# Patient Record
Sex: Male | Born: 1990 | Race: Black or African American | Hispanic: No | Marital: Single | Smoking: Never smoker
Health system: Southern US, Community
[De-identification: ages and names within clinical notes are randomized; demographics above are authoritative.]

## PROBLEM LIST (undated history)

## (undated) DIAGNOSIS — L0390 Cellulitis, unspecified: Secondary | ICD-10-CM

## (undated) DIAGNOSIS — G40909 Epilepsy, unspecified, not intractable, without status epilepticus: Secondary | ICD-10-CM

## (undated) DIAGNOSIS — D693 Immune thrombocytopenic purpura: Secondary | ICD-10-CM

## (undated) HISTORY — PX: TONSILLECTOMY: SUR1361

---
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
## 2013-03-06 ENCOUNTER — Emergency Department: Payer: Self-pay | Admitting: Emergency Medicine

## 2013-03-06 LAB — COMPREHENSIVE METABOLIC PANEL
ANION GAP: 1 — AB (ref 7–16)
Albumin: 3.8 g/dL (ref 3.4–5.0)
Alkaline Phosphatase: 103 U/L
BILIRUBIN TOTAL: 0.2 mg/dL (ref 0.2–1.0)
BUN: 16 mg/dL (ref 7–18)
Calcium, Total: 9.2 mg/dL (ref 8.5–10.1)
Chloride: 105 mmol/L (ref 98–107)
Co2: 31 mmol/L (ref 21–32)
Creatinine: 1.01 mg/dL (ref 0.60–1.30)
GLUCOSE: 109 mg/dL — AB (ref 65–99)
OSMOLALITY: 276 (ref 275–301)
Potassium: 3.7 mmol/L (ref 3.5–5.1)
SGOT(AST): 25 U/L (ref 15–37)
SGPT (ALT): 31 U/L (ref 12–78)
SODIUM: 137 mmol/L (ref 136–145)
Total Protein: 7.7 g/dL (ref 6.4–8.2)

## 2013-03-06 LAB — CBC
HCT: 41.4 % (ref 40.0–52.0)
HGB: 13.3 g/dL (ref 13.0–18.0)
MCH: 25.7 pg — ABNORMAL LOW (ref 26.0–34.0)
MCHC: 32.2 g/dL (ref 32.0–36.0)
MCV: 80 fL (ref 80–100)
PLATELETS: 234 10*3/uL (ref 150–440)
RBC: 5.18 10*6/uL (ref 4.40–5.90)
RDW: 14.8 % — ABNORMAL HIGH (ref 11.5–14.5)
WBC: 8.2 10*3/uL (ref 3.8–10.6)

## 2013-03-07 LAB — URINALYSIS, COMPLETE
Bacteria: NONE SEEN
Bilirubin,UR: NEGATIVE
Blood: NEGATIVE
GLUCOSE, UR: NEGATIVE mg/dL (ref 0–75)
Ketone: NEGATIVE
Leukocyte Esterase: NEGATIVE
Nitrite: NEGATIVE
PH: 6 (ref 4.5–8.0)
Protein: NEGATIVE
Specific Gravity: 1.027 (ref 1.003–1.030)
Squamous Epithelial: <1
WBC UR: 3 /HPF (ref 0–5)

## 2013-05-01 ENCOUNTER — Emergency Department: Payer: Self-pay | Admitting: Emergency Medicine

## 2013-11-14 ENCOUNTER — Emergency Department: Payer: Self-pay | Admitting: Emergency Medicine

## 2013-12-19 ENCOUNTER — Emergency Department: Payer: Self-pay | Admitting: Emergency Medicine

## 2013-12-19 LAB — COMPREHENSIVE METABOLIC PANEL
ALBUMIN: 3.8 g/dL (ref 3.4–5.0)
Alkaline Phosphatase: 106 U/L
Anion Gap: 6 — ABNORMAL LOW (ref 7–16)
BUN: 14 mg/dL (ref 7–18)
Bilirubin,Total: 0.4 mg/dL (ref 0.2–1.0)
CHLORIDE: 105 mmol/L (ref 98–107)
Calcium, Total: 8.9 mg/dL (ref 8.5–10.1)
Co2: 27 mmol/L (ref 21–32)
Creatinine: 0.9 mg/dL (ref 0.60–1.30)
GLUCOSE: 130 mg/dL — AB (ref 65–99)
OSMOLALITY: 278 (ref 275–301)
Potassium: 4.6 mmol/L (ref 3.5–5.1)
SGOT(AST): 36 U/L (ref 15–37)
SGPT (ALT): 56 U/L
Sodium: 138 mmol/L (ref 136–145)
Total Protein: 8.2 g/dL (ref 6.4–8.2)

## 2013-12-19 LAB — URINALYSIS, COMPLETE
BILIRUBIN, UR: NEGATIVE
Bacteria: NONE SEEN
GLUCOSE, UR: NEGATIVE mg/dL (ref 0–75)
Ketone: NEGATIVE
LEUKOCYTE ESTERASE: NEGATIVE
NITRITE: NEGATIVE
PROTEIN: NEGATIVE
Ph: 5 (ref 4.5–8.0)
SPECIFIC GRAVITY: 1.015 (ref 1.003–1.030)
Squamous Epithelial: <1
WBC UR: <1 /HPF (ref 0–5)

## 2013-12-19 LAB — CBC WITH DIFFERENTIAL/PLATELET
BASOS ABS: 0 10*3/uL (ref 0.0–0.1)
Basophil %: 0.2 %
EOS ABS: 0.1 10*3/uL (ref 0.0–0.7)
EOS PCT: 0.4 %
HCT: 46.5 % (ref 40.0–52.0)
HGB: 14.8 g/dL (ref 13.0–18.0)
Lymphocyte #: 0.9 10*3/uL — ABNORMAL LOW (ref 1.0–3.6)
Lymphocyte %: 6.4 %
MCH: 25.8 pg — ABNORMAL LOW (ref 26.0–34.0)
MCHC: 31.7 g/dL — ABNORMAL LOW (ref 32.0–36.0)
MCV: 81 fL (ref 80–100)
Monocyte #: 0.3 x10 3/mm (ref 0.2–1.0)
Monocyte %: 2.5 %
Neutrophil #: 12.2 10*3/uL — ABNORMAL HIGH (ref 1.4–6.5)
Neutrophil %: 90.5 %
PLATELETS: 244 10*3/uL (ref 150–440)
RBC: 5.72 10*6/uL (ref 4.40–5.90)
RDW: 14.8 % — ABNORMAL HIGH (ref 11.5–14.5)
WBC: 13.5 10*3/uL — AB (ref 3.8–10.6)

## 2014-01-11 ENCOUNTER — Emergency Department: Payer: Self-pay | Admitting: Emergency Medicine

## 2014-01-11 LAB — CBC WITH DIFFERENTIAL/PLATELET
Basophil #: 0 10*3/uL (ref 0.0–0.1)
Basophil %: 0.1 %
Eosinophil #: 0 10*3/uL (ref 0.0–0.7)
Eosinophil %: 0 %
HCT: 40.7 % (ref 40.0–52.0)
HGB: 13.1 g/dL (ref 13.0–18.0)
LYMPHS PCT: 2.3 %
Lymphocyte #: 0.5 10*3/uL — ABNORMAL LOW (ref 1.0–3.6)
MCH: 25.5 pg — ABNORMAL LOW (ref 26.0–34.0)
MCHC: 32.1 g/dL (ref 32.0–36.0)
MCV: 80 fL (ref 80–100)
MONOS PCT: 1.6 %
Monocyte #: 0.3 x10 3/mm (ref 0.2–1.0)
NEUTROS PCT: 96 %
Neutrophil #: 19.7 10*3/uL — ABNORMAL HIGH (ref 1.4–6.5)
Platelet: 231 10*3/uL (ref 150–440)
RBC: 5.12 10*6/uL (ref 4.40–5.90)
RDW: 15.4 % — AB (ref 11.5–14.5)
WBC: 20.5 10*3/uL — ABNORMAL HIGH (ref 3.8–10.6)

## 2014-01-11 LAB — BASIC METABOLIC PANEL
Anion Gap: 9 (ref 7–16)
BUN: 19 mg/dL — AB (ref 7–18)
CREATININE: 1.35 mg/dL — AB (ref 0.60–1.30)
Calcium, Total: 8.4 mg/dL — ABNORMAL LOW (ref 8.5–10.1)
Chloride: 99 mmol/L (ref 98–107)
Co2: 28 mmol/L (ref 21–32)
EGFR (African American): >60
EGFR (Non-African Amer.): >60
GLUCOSE: 103 mg/dL — AB (ref 65–99)
Osmolality: 274 (ref 275–301)
POTASSIUM: 3.9 mmol/L (ref 3.5–5.1)
Sodium: 136 mmol/L (ref 136–145)

## 2014-01-15 ENCOUNTER — Encounter (HOSPITAL_COMMUNITY): Payer: Self-pay | Admitting: *Deleted

## 2014-01-15 DIAGNOSIS — Z6841 Body Mass Index (BMI) 40.0 and over, adult: Secondary | ICD-10-CM

## 2014-01-15 DIAGNOSIS — L03116 Cellulitis of left lower limb: Principal | ICD-10-CM | POA: Diagnosis present

## 2014-01-15 DIAGNOSIS — E119 Type 2 diabetes mellitus without complications: Secondary | ICD-10-CM | POA: Diagnosis present

## 2014-01-15 DIAGNOSIS — Z881 Allergy status to other antibiotic agents status: Secondary | ICD-10-CM

## 2014-01-15 DIAGNOSIS — D649 Anemia, unspecified: Secondary | ICD-10-CM | POA: Diagnosis present

## 2014-01-15 NOTE — ED Notes (Signed)
Patient presents with c/o left leg swelling.  Has been seen and placed on antibiotics but his leg is still swollen and painful

## 2014-01-16 ENCOUNTER — Encounter (HOSPITAL_COMMUNITY): Payer: Self-pay | Admitting: *Deleted

## 2014-01-16 ENCOUNTER — Inpatient Hospital Stay (HOSPITAL_COMMUNITY)
Admission: EM | Admit: 2014-01-16 | Discharge: 2014-01-19 | DRG: 603 | Disposition: A | Discharge status: Home / Self Care | Payer: Self-pay | Attending: Family Medicine | Admitting: Family Medicine

## 2014-01-16 DIAGNOSIS — L03116 Cellulitis of left lower limb: Secondary | ICD-10-CM

## 2014-01-16 DIAGNOSIS — L0390 Cellulitis, unspecified: Secondary | ICD-10-CM | POA: Diagnosis present

## 2014-01-16 LAB — CBC WITH DIFFERENTIAL/PLATELET
BASOS ABS: 0 10*3/uL (ref 0.0–0.1)
BASOS PCT: 1 % (ref 0–1)
Basophils Absolute: 0.1 10*3/uL (ref 0.0–0.1)
Basophils Relative: 0 % (ref 0–1)
EOS ABS: 0.1 10*3/uL (ref 0.0–0.7)
Eosinophils Absolute: 0.2 10*3/uL (ref 0.0–0.7)
Eosinophils Relative: 2 % (ref 0–5)
Eosinophils Relative: 2 % (ref 0–5)
HCT: 31.1 % — ABNORMAL LOW (ref 39.0–52.0)
HCT: 31.5 % — ABNORMAL LOW (ref 39.0–52.0)
Hemoglobin: 10.7 g/dL — ABNORMAL LOW (ref 13.0–17.0)
Hemoglobin: 10.4 g/dL — ABNORMAL LOW (ref 13.0–17.0)
LYMPHS PCT: 18 % (ref 12–46)
Lymphocytes Relative: 19 % (ref 12–46)
Lymphs Abs: 1.8 10*3/uL (ref 0.7–4.0)
Lymphs Abs: 1.5 10*3/uL (ref 0.7–4.0)
MCH: 26.3 pg (ref 26.0–34.0)
MCH: 25.6 pg — ABNORMAL LOW (ref 26.0–34.0)
MCHC: 34.4 g/dL (ref 30.0–36.0)
MCHC: 33 g/dL (ref 30.0–36.0)
MCV: 76.4 fL — ABNORMAL LOW (ref 78.0–100.0)
MCV: 77.4 fL — ABNORMAL LOW (ref 78.0–100.0)
MONO ABS: 0.8 10*3/uL (ref 0.1–1.0)
MONOS PCT: 8 % (ref 3–12)
Monocytes Absolute: 0.7 10*3/uL (ref 0.1–1.0)
Monocytes Relative: 9 % (ref 3–12)
NEUTROS ABS: 5.9 10*3/uL (ref 1.7–7.7)
NEUTROS PCT: 69 % (ref 43–77)
Neutro Abs: 6.5 10*3/uL (ref 1.7–7.7)
Neutrophils Relative %: 72 % (ref 43–77)
Platelets: 224 10*3/uL (ref 150–400)
Platelets: 227 10*3/uL (ref 150–400)
RBC: 4.07 MIL/uL — ABNORMAL LOW (ref 4.22–5.81)
RBC: 4.07 MIL/uL — ABNORMAL LOW (ref 4.22–5.81)
RDW: 14.8 % (ref 11.5–15.5)
RDW: 14.8 % (ref 11.5–15.5)
WBC: 9.4 10*3/uL (ref 4.0–10.5)
WBC: 8.3 10*3/uL (ref 4.0–10.5)

## 2014-01-16 LAB — COMPREHENSIVE METABOLIC PANEL
ALT: 31 U/L (ref 0–53)
ANION GAP: 13 (ref 5–15)
AST: 31 U/L (ref 0–37)
Albumin: 2.8 g/dL — ABNORMAL LOW (ref 3.5–5.2)
Alkaline Phosphatase: 91 U/L (ref 39–117)
BILIRUBIN TOTAL: 0.6 mg/dL (ref 0.3–1.2)
BUN: 14 mg/dL (ref 6–23)
CO2: 26 mEq/L (ref 19–32)
CREATININE: 0.93 mg/dL (ref 0.50–1.35)
Calcium: 8.8 mg/dL (ref 8.4–10.5)
Chloride: 100 mEq/L (ref 96–112)
GFR calc Af Amer: >90 mL/min (ref >90)
GFR calc non Af Amer: >90 mL/min (ref >90)
Glucose, Bld: 105 mg/dL — ABNORMAL HIGH (ref 70–99)
Potassium: 3.5 mEq/L — ABNORMAL LOW (ref 3.7–5.3)
Sodium: 139 mEq/L (ref 137–147)
TOTAL PROTEIN: 7 g/dL (ref 6.0–8.3)

## 2014-01-16 LAB — CBC
HCT: 31.2 % — ABNORMAL LOW (ref 39.0–52.0)
Hemoglobin: 10.3 g/dL — ABNORMAL LOW (ref 13.0–17.0)
MCH: 25.1 pg — ABNORMAL LOW (ref 26.0–34.0)
MCHC: 33 g/dL (ref 30.0–36.0)
MCV: 75.9 fL — ABNORMAL LOW (ref 78.0–100.0)
PLATELETS: 231 10*3/uL (ref 150–400)
RBC: 4.11 MIL/uL — ABNORMAL LOW (ref 4.22–5.81)
RDW: 14.8 % (ref 11.5–15.5)
WBC: 9.1 10*3/uL (ref 4.0–10.5)

## 2014-01-16 LAB — BASIC METABOLIC PANEL
Anion gap: 16 — ABNORMAL HIGH (ref 5–15)
BUN: 15 mg/dL (ref 6–23)
CO2: 23 mEq/L (ref 19–32)
CREATININE: 1 mg/dL (ref 0.50–1.35)
Calcium: 8.7 mg/dL (ref 8.4–10.5)
Chloride: 100 mEq/L (ref 96–112)
Glucose, Bld: 92 mg/dL (ref 70–99)
POTASSIUM: 3.6 meq/L — AB (ref 3.7–5.3)
Sodium: 139 mEq/L (ref 137–147)

## 2014-01-16 LAB — CULTURE, BLOOD (SINGLE)

## 2014-01-16 LAB — HEMOGLOBIN A1C
HEMOGLOBIN A1C: 6.4 % — AB (ref <5.7)
MEAN PLASMA GLUCOSE: 137 mg/dL — AB (ref <117)

## 2014-01-16 LAB — CREATININE, SERUM
CREATININE: 0.93 mg/dL (ref 0.50–1.35)
GFR calc Af Amer: >90 mL/min (ref >90)
GFR calc non Af Amer: >90 mL/min (ref >90)

## 2014-01-16 MED ORDER — HEPARIN SODIUM (PORCINE) 5000 UNIT/ML IJ SOLN
5000.0000 [IU] | Freq: Three times a day (TID) | INTRAMUSCULAR | Status: DC
  Administered 2014-01-16 – 2014-01-19 (×10): 5000 [IU] via SUBCUTANEOUS
  Filled 2014-01-16 (×11): qty 1

## 2014-01-16 MED ORDER — VANCOMYCIN HCL IN DEXTROSE 1-5 GM/200ML-% IV SOLN
1000.0000 mg | Freq: Once | INTRAVENOUS | Status: AC
  Administered 2014-01-16: 1000 mg via INTRAVENOUS
  Filled 2014-01-16: qty 200

## 2014-01-16 MED ORDER — POTASSIUM CHLORIDE IN NACL 20-0.9 MEQ/L-% IV SOLN
INTRAVENOUS | Status: DC
  Administered 2014-01-16 – 2014-01-17 (×3): via INTRAVENOUS
  Filled 2014-01-16 (×9): qty 1000

## 2014-01-16 MED ORDER — VANCOMYCIN HCL 10 G IV SOLR
1500.0000 mg | Freq: Once | INTRAVENOUS | Status: AC
  Administered 2014-01-16: 1500 mg via INTRAVENOUS
  Filled 2014-01-16: qty 1500

## 2014-01-16 MED ORDER — VANCOMYCIN HCL 10 G IV SOLR
1500.0000 mg | Freq: Two times a day (BID) | INTRAVENOUS | Status: DC
  Administered 2014-01-16 – 2014-01-18 (×5): 1500 mg via INTRAVENOUS
  Filled 2014-01-16 (×6): qty 1500

## 2014-01-16 NOTE — H&P (Signed)
Hospitalist Admission History and Physical  Patient name: Samuel Rojas Medical record number: [id number] Date of birth: 11/02/1990 Age: 23 y.o. Gender: male  Primary Care Provider: No primary care provider on file.  Chief Complaint: cellulitis  History of Present Illness:This is a 23 y.o. year old male with significant past medical history of morbid obesity, borderline DM presenting with cellulitis. Pt [facility] 5 days ago. Also had LE u/s done at the time that was negative for DVT. Was placed on course of bactrim. Has had minimal improvement in sxs. + temp at home up to 101. Decreased appetite. No nausea. Borderline diabetic. Nonsmoker.  T 99.2, HR 90s-120s, Resp 10s-20s, BO 120s-130s. Satting 98% on RA. WBC 9.4, hgb 10.7, K 3.6, Cr 1. Started on IV vanc.    Assessment and Plan: Samuel Rojas is a 23 y.o. year old male presenting with cellulitis   Active Problems:   Cellulitis   1- Cellulitis -marked LE swelling and redness -cont IV vanc  -blood cultures -follow    FEN/GI: heart healthy, carb modified diet.  Prophylaxis: sub q heparin  Disposition: pending further evaluation  Code Status:Full Code    Patient Active Problem List   Diagnosis Date Noted  . Cellulitis 01/16/2014   Past Medical History: History reviewed. No pertinent past medical history.  Past Surgical History: Past Surgical History  Procedure Laterality Date  . Tonsillectomy      Social History: History   Social History  . Marital Status: Single    Spouse Name: N/A    Number of Children: N/A  . Years of Education: N/A   Social History Main Topics  . Smoking status: Never Smoker   . Smokeless tobacco: Never Used  . Alcohol Use: No  . Drug Use: No  . Sexual Activity: None   Other Topics Concern  . None   Social History Narrative  . None    Family History: No family history on file.  Allergies: Allergies  Allergen Reactions  . Augmentin  [Amoxicillin-Pot Clavulanate] Hives    Current Facility-Administered Medications  Medication Dose Route Frequency Provider Last Rate Last Dose  . 0.9 % NaCl with KCl 20 mEq/ L  infusion   Intravenous Continuous Doree AlbeeSteven Harmony Sandell, MD      . heparin injection 5,000 Units  5,000 Units Subcutaneous 3 times per day Doree AlbeeSteven Lilia Letterman, MD      . vancomycin (VANCOCIN) IVPB 1000 mg/200 mL premix  1,000 mg Intravenous Once Olivia Mackielga M Otter, MD 200 mL/hr at 01/16/14 0335 1,000 mg at 01/16/14 0335   Current Outpatient Prescriptions  Medication Sig Dispense Refill  . ibuprofen (ADVIL,MOTRIN) 800 MG tablet Take 800 mg by mouth 3 (three) times daily.    Marland Kitchen. sulfamethoxazole-trimethoprim (BACTRIM DS,SEPTRA DS) 800-160 MG per tablet Take 1 tablet by mouth 2 (two) times daily.     Review Of Systems: 12 point ROS negative except as noted above in HPI.  Physical Exam: Filed Vitals:   01/16/14 0200  BP: 137/94  Pulse: 100  Temp:   Resp: 18    General: alert, cooperative and morbidly obese HEENT: PERRLA and extra ocular movement intact Heart: S1, S2 normal, no murmur, rub or gallop, regular rate and rhythm Lungs: clear to auscultation, no wheezes or rales and unlabored breathing Abdomen: obese abdomen, non tender, + bowel sounds  Extremities: LLE swelling, tenderness up to upper calf, distal pulses intact  Skin:as above  Neurology: normal without focal findings  Labs and Imaging: Lab  Results  Component Value Date/Time   NA 139 01/16/2014 02:27 AM   K 3.6* 01/16/2014 02:27 AM   CL 100 01/16/2014 02:27 AM   CO2 23 01/16/2014 02:27 AM   BUN 15 01/16/2014 02:27 AM   CREATININE 1.00 01/16/2014 02:27 AM   GLUCOSE 92 01/16/2014 02:27 AM   Lab Results  Component Value Date   WBC 9.4 01/16/2014   HGB 10.7* 01/16/2014   HCT 31.1* 01/16/2014   MCV 76.4* 01/16/2014   PLT 224 01/16/2014    No results found.         Doree AlbeeSteven Maila Dukes MD  Pager: [phone]

## 2014-01-16 NOTE — Progress Notes (Signed)
UR completed 

## 2014-01-16 NOTE — ED Notes (Signed)
Report attempted, RN to call back in 10 min. 

## 2014-01-16 NOTE — Progress Notes (Signed)
Patient seen and admitted earlier this am by my associate. Please refer to H and P for details regarding assessment and plan.  Will reassess next am.  Penny PiaVEGA, Imanuel Pruiett

## 2014-01-16 NOTE — ED Notes (Signed)
Dr. Newton at the bedside.  

## 2014-01-16 NOTE — Progress Notes (Signed)
ANTIBIOTIC CONSULT NOTE - INITIAL  Pharmacy Consult for vancomycin Indication: cellulitis  Allergies  Allergen Reactions  . Augmentin [Amoxicillin-Pot Clavulanate] Hives    Patient Measurements: Height: 5\' 6"  (167.6 cm) Weight: (!) 340 lb (154.223 kg) IBW/kg (Calculated) : 63.8  Vital Signs: Temp: 99.2 F (37.3 C) (12/08 0110) Temp Source: Oral (12/08 0110) BP: 137/94 mmHg (12/08 0200) Pulse Rate: 100 (12/08 0200)  Labs:  Recent Labs  01/16/14 0227  WBC 9.4  HGB 10.7*  PLT 224  CREATININE 1.00   Estimated Creatinine Clearance: 165.3 mL/min (by C-G formula based on Cr of 1).  Medical History: History reviewed. No pertinent past medical history.   Assessment: 23yo male c/o LLE swelling, has been on Bactrim x4d as outpt but reports no improvement, to begin IV ABX for cellulitis.  Goal of Therapy:  Vancomycin trough level 10-15 mcg/ml  Plan:  Rec'd vanc 1g in ED; will give additional vancomycin 1500mg  IV x1 to complete load of 2.5g followed by 1500mg  IV Q12H and monitor CBC, Cx, levels prn.  Vernard GamblesVeronda Arnika Larzelere, PharmD, BCPS  01/16/2014,3:50 AM

## 2014-01-16 NOTE — ED Provider Notes (Signed)
CSN: [id number]     Arrival date & time 01/15/14  2303 History  This chart was scribed for Samuel Rojas M Kenzley Ke, MD by Bronson CurbJacqueline Melvin, ED Scribe. This patient was seen in room A06C/A06C and the patient's care was started at 1:41 AM.     Chief Complaint  Patient presents with  . Leg Swelling    The history is provided by the patient. No language interpreter was used.     HPI Comments: Samuel Rojas is a 23 y.o. male who presents to the Emergency Department complaining of left leg swelling that began 2 weeks ago. Patient also notes associated pain from the calf down for the past 5 days. He states was recently placed on ABX for a possible infection  5 days ago at [facility].  He reports he had a negative ultrasound and elevated white blood count.  He states he has been taking the ABX twice a day for the past 4 days. He reports associated fever (101.4 F; axillary, at home). Patient has been taking ibuprofen for pain relief. Patient reports history of fluid in BLE and suspects this was the cause of his swelling. Patient has been diagnosed with mild anemia and low iron by PCP, however, he was not placed on iron supplements stating it could be controlled with diet. He denies SOB. Patient denies history of prior infection or cellulitis to BLE. He further denies history of DM (borderline) and HTN.   History reviewed. No pertinent past medical history. Past Surgical History  Procedure Laterality Date  . Tonsillectomy     No family history on file. History  Substance Use Topics  . Smoking status: Never Smoker   . Smokeless tobacco: Never Used  . Alcohol Use: No    Review of Systems  Constitutional: Positive for fever.  Respiratory: Negative for shortness of breath.   Cardiovascular: Positive for leg swelling (left).  Musculoskeletal: Positive for myalgias (left leg).      Allergies  Augmentin  Home Medications   Prior to Admission medications   Not on File   Triage Vitals:  BP 130/85 mmHg  Pulse 99  Temp(Src) 99.2 F (37.3 C) (Oral)  Resp 28  Ht 5\' 6"  (1.676 m)  Wt 340 lb (154.223 kg)  BMI 54.90 kg/m2  SpO2 100%  Physical Exam  Constitutional: He is oriented to person, place, and time. He appears well-developed and well-nourished. No distress.  HENT:  Head: Normocephalic and atraumatic.  Nose: Nose normal.  Mouth/Throat: Oropharynx is clear and moist.  Eyes: Conjunctivae and EOM are normal. Pupils are equal, round, and reactive to light.  Neck: Normal range of motion. Neck supple. No JVD present. No tracheal deviation present. No thyromegaly present.  Cardiovascular: Normal rate, regular rhythm, normal heart sounds and intact distal pulses.  Exam reveals no gallop and no friction rub.   No murmur heard. Pulmonary/Chest: Effort normal and breath sounds normal. No stridor. No respiratory distress. He has no wheezes. He has no rales. He exhibits no tenderness.  Abdominal: Soft. Bowel sounds are normal. He exhibits no distension and no mass. There is no tenderness. There is no rebound and no guarding.  Musculoskeletal: Normal range of motion. He exhibits edema and tenderness.  Patient has edema of the left lower leg with erythema to calf and foot.  Pain with palpation, no crepitus, no fluctuance, no abscess noted  Lymphadenopathy:    He has no cervical adenopathy.  Neurological: He is alert and oriented to person, place, and time.  He displays normal reflexes. He exhibits normal muscle tone. Coordination normal.  Skin: Skin is warm and dry. No rash noted. There is erythema. No pallor.  Psychiatric: He has a normal mood and affect. His behavior is normal. Judgment and thought content normal.  Nursing note and vitals reviewed.   ED Course  Procedures (including critical care time)  DIAGNOSTIC STUDIES: Oxygen Saturation is 100% on room air, normal by my interpretation.    COORDINATION OF CARE: At 0147 Discussed treatment plan with patient which includes  labs and IV ABX. Patient agrees.    Labs Review Labs Reviewed  CBC WITH DIFFERENTIAL - Abnormal; Notable for the following:    RBC 4.07 (*)    Hemoglobin 10.7 (*)    HCT 31.1 (*)    MCV 76.4 (*)    All other components within normal limits  BASIC METABOLIC PANEL - Abnormal; Notable for the following:    Potassium 3.6 (*)    Anion gap 16 (*)    All other components within normal limits  CULTURE, BLOOD (ROUTINE X 2)  CULTURE, BLOOD (ROUTINE X 2)    Imaging Review No results found.   EKG Interpretation   Date/Time:  Tuesday January 16 2014 01:09:03 EST Ventricular Rate:  100 PR Interval:  152 QRS Duration: 105 QT Interval:  364 QTC Calculation: 469 R Axis:   -15 Text Interpretation:  Sinus tachycardia Borderline left axis deviation  Borderline prolonged QT interval No old tracing to compare Confirmed by  Jillianne Gamino  MD, Lynda Capistran ([id number]) on 01/16/2014 1:33:46 AM      MDM   Final diagnoses:  Cellulitis of left lower leg    23 year old male with persistent cellulitis of left lower leg with systemic symptoms, fevers.  Patient is taking Bactrim as described without improvement in symptoms since Thursday.  Outside records reviewed, no DVT on peripheral ultrasound.  Will start on vancomycin, discuss with hospitalist for admission.  I personally performed the services described in this documentation, which was scribed in my presence. The recorded information has been reviewed and is accurate.    Samuel Rojas M Jabrea Kallstrom, MD 01/16/14 [phone]

## 2014-01-17 DIAGNOSIS — R7309 Other abnormal glucose: Secondary | ICD-10-CM

## 2014-01-17 LAB — COMPREHENSIVE METABOLIC PANEL
ALBUMIN: 2.6 g/dL — AB (ref 3.5–5.2)
ALT: 26 U/L (ref 0–53)
AST: 21 U/L (ref 0–37)
Alkaline Phosphatase: 87 U/L (ref 39–117)
Anion gap: 15 (ref 5–15)
BUN: 12 mg/dL (ref 6–23)
CALCIUM: 8.7 mg/dL (ref 8.4–10.5)
CHLORIDE: 98 meq/L (ref 96–112)
CO2: 22 mEq/L (ref 19–32)
CREATININE: 0.85 mg/dL (ref 0.50–1.35)
GFR calc Af Amer: >90 mL/min (ref >90)
GFR calc non Af Amer: >90 mL/min (ref >90)
Glucose, Bld: 92 mg/dL (ref 70–99)
Potassium: 3.9 mEq/L (ref 3.7–5.3)
Sodium: 135 mEq/L — ABNORMAL LOW (ref 137–147)
Total Bilirubin: 0.6 mg/dL (ref 0.3–1.2)
Total Protein: 6.8 g/dL (ref 6.0–8.3)

## 2014-01-17 LAB — CBC WITH DIFFERENTIAL/PLATELET
Basophils Absolute: 0.1 10*3/uL (ref 0.0–0.1)
Basophils Relative: 1 % (ref 0–1)
EOS PCT: 1 % (ref 0–5)
Eosinophils Absolute: 0.1 10*3/uL (ref 0.0–0.7)
HEMATOCRIT: 31.4 % — AB (ref 39.0–52.0)
Hemoglobin: 10.2 g/dL — ABNORMAL LOW (ref 13.0–17.0)
LYMPHS ABS: 1.8 10*3/uL (ref 0.7–4.0)
LYMPHS PCT: 22 % (ref 12–46)
MCH: 24.8 pg — ABNORMAL LOW (ref 26.0–34.0)
MCHC: 32.5 g/dL (ref 30.0–36.0)
MCV: 76.4 fL — ABNORMAL LOW (ref 78.0–100.0)
MONOS PCT: 9 % (ref 3–12)
Monocytes Absolute: 0.7 10*3/uL (ref 0.1–1.0)
NEUTROS ABS: 5.6 10*3/uL (ref 1.7–7.7)
Neutrophils Relative %: 67 % (ref 43–77)
Platelets: 277 10*3/uL (ref 150–400)
RBC: 4.11 MIL/uL — AB (ref 4.22–5.81)
RDW: 14.8 % (ref 11.5–15.5)
WBC: 8.3 10*3/uL (ref 4.0–10.5)

## 2014-01-17 NOTE — Progress Notes (Signed)
9:34 AM I agree with HPI/GPe and A/P per Dr. Alvester MorinNewton    23 y/o m DM ty 2, Morbid obesity, Body mass index is 55.5 kg/(m^2). recently seen and [facility] ~12/4 for LE cellulitis.  [facility] with Impressive LLE swelling and worsening sym[toms    HEENT alert pleasant CHEST cta b CARDIAC s1 s 2no m/r/g SKIN/MUSCULAR LLE >>>bigge than RLE  Patient Active Problem List   Diagnosis Date Noted  . Cellulitis 01/16/2014   Cont IV abx Mark out area for comparison CBC + Diff am Degree swelling unusual but DVt r/o already done ARMS  Discussed c family   Pleas KochJai Deuce Paternoster, MD [facility] [phone]

## 2014-01-18 LAB — CBC WITH DIFFERENTIAL/PLATELET
BASOS PCT: 0 % (ref 0–1)
Basophils Absolute: 0 10*3/uL (ref 0.0–0.1)
EOS PCT: 2 % (ref 0–5)
Eosinophils Absolute: 0.2 10*3/uL (ref 0.0–0.7)
HCT: 33.2 % — ABNORMAL LOW (ref 39.0–52.0)
Hemoglobin: 11 g/dL — ABNORMAL LOW (ref 13.0–17.0)
Lymphocytes Relative: 22 % (ref 12–46)
Lymphs Abs: 2.1 10*3/uL (ref 0.7–4.0)
MCH: 25.9 pg — ABNORMAL LOW (ref 26.0–34.0)
MCHC: 33.1 g/dL (ref 30.0–36.0)
MCV: 78.1 fL (ref 78.0–100.0)
MONOS PCT: 5 % (ref 3–12)
Monocytes Absolute: 0.5 10*3/uL (ref 0.1–1.0)
NEUTROS PCT: 71 % (ref 43–77)
Neutro Abs: 6.6 10*3/uL (ref 1.7–7.7)
Platelets: 345 10*3/uL (ref 150–400)
RBC: 4.25 MIL/uL (ref 4.22–5.81)
RDW: 15 % (ref 11.5–15.5)
WBC: 9.4 10*3/uL (ref 4.0–10.5)

## 2014-01-18 LAB — COMPREHENSIVE METABOLIC PANEL
ALT: 29 U/L (ref 0–53)
AST: 24 U/L (ref 0–37)
Albumin: 2.9 g/dL — ABNORMAL LOW (ref 3.5–5.2)
Alkaline Phosphatase: 93 U/L (ref 39–117)
Anion gap: 17 — ABNORMAL HIGH (ref 5–15)
BUN: 10 mg/dL (ref 6–23)
CALCIUM: 9.4 mg/dL (ref 8.4–10.5)
CO2: 20 mEq/L (ref 19–32)
Chloride: 99 mEq/L (ref 96–112)
Creatinine, Ser: 0.78 mg/dL (ref 0.50–1.35)
GFR calc non Af Amer: >90 mL/min (ref >90)
GLUCOSE: 84 mg/dL (ref 70–99)
Potassium: 4.3 mEq/L (ref 3.7–5.3)
Sodium: 136 mEq/L — ABNORMAL LOW (ref 137–147)
TOTAL PROTEIN: 7.6 g/dL (ref 6.0–8.3)
Total Bilirubin: 0.5 mg/dL (ref 0.3–1.2)

## 2014-01-18 MED ORDER — SULFAMETHOXAZOLE-TRIMETHOPRIM 800-160 MG PO TABS
1.0000 | ORAL_TABLET | Freq: Two times a day (BID) | ORAL | Status: DC
  Administered 2014-01-18 – 2014-01-19 (×3): 1 via ORAL
  Filled 2014-01-18 (×4): qty 1

## 2014-01-18 NOTE — Progress Notes (Signed)
Samuel Rojas D Egolf WUJ:[id number] DOB: [birth year] DOA: 01/16/2014 PCP: No primary care provider on file.  Brief narrative: 23 y/o m DM ty 2, Morbid obesity, Body mass index is 55.5 kg/(m^2). [facility] and [facility]12/4 for LE cellulitis. [facility] Impressive LLE swelling and worsening sym[toms  Past medical history-As per Problem list Chart reviewed as below-  Consultants:  None  Procedures:  None  Antibiotics:  Vancomycin 12/7   Subjective  Alert pleasant oriented. No fevwer no chills no n/v   Objective    Interim History:   Telemetry:    Objective: Filed Vitals:   01/17/14 1351 01/17/14 2138 01/18/14 0132 01/18/14 0515  BP: 135/84 150/82 106/71 133/74  Pulse: 93 106 106 99  Temp: 98.8 F (37.1 C) 99.2 F (37.3 C) 99.7 F (37.6 C) 98.3 F (36.8 C)  TempSrc: Oral Oral Oral Oral  Resp: 18 18 18    Height:      Weight:      SpO2: 99% 100% 94% 98%    Intake/Output Summary (Last 24 hours) at 01/18/14 0819 Last data filed at 01/17/14 1845  Gross per 24 hour  Intake    720 ml  Output      0 ml  Net    720 ml    Exam:  General: Body mass index is 55.5 kg/(m^2). Cardiovascular: S1-S2 no murmur rub or gallop Respiratory: Clear clinically clear Abdomen: No pain Skin no lower extremity edema however swelling in the left leg is decreasing as evidenced by wrinkling of the skin. Toes appear to have some stasis dermatitis changes. Area of wound marking is diminished. Prior Neuro intact  Data Reviewed: Basic Metabolic Panel:  Recent Labs Lab 01/16/14 0227 01/16/14 0348 01/16/14 0531 01/17/14 0320 01/18/14 0346  NA 139  --  139 135* 136*  K 3.6*  --  3.5* 3.9 4.3  CL 100  --  100 98 99  CO2 23  --  26 22 20   GLUCOSE 92  --  105* 92 84  BUN 15  --  14 12 10   CREATININE 1.00 0.93 0.93 0.85 0.78  CALCIUM 8.7  --  8.8 8.7 9.4   Liver Function Tests:  Recent Labs Lab 01/16/14 0531 01/17/14 0320 01/18/14 0346  AST 31 21 24    ALT 31 26 29   ALKPHOS 91 87 93  BILITOT 0.6 0.6 0.5  PROT 7.0 6.8 7.6  ALBUMIN 2.8* 2.6* 2.9*   No results for input(s): LIPASE, AMYLASE in the last 168 hours. No results for input(s): AMMONIA in the last 168 hours. CBC:  Recent Labs Lab 01/16/14 0227 01/16/14 0348 01/16/14 0531 01/17/14 0320 01/18/14 0346  WBC 9.4 9.1 8.3 8.3 9.4  NEUTROABS 6.5  --  5.9 5.6 6.6  HGB 10.7* 10.3* 10.4* 10.2* 11.0*  HCT 31.1* 31.2* 31.5* 31.4* 33.2*  MCV 76.4* 75.9* 77.4* 76.4* 78.1  PLT 224 231 227 277 345   Cardiac Enzymes: No results for input(s): CKTOTAL, CKMB, CKMBINDEX, TROPONINI in the last 168 hours. BNP: Invalid input(s): POCBNP CBG: No results for input(s): GLUCAP in the last 168 hours.  Recent Results (from the past 240 hour(s))  Blood culture (routine x 2)     Status: None (Preliminary result)   Collection Time: 01/16/14  2:24 AM  Result Value Ref Range Status   Specimen Description BLOOD RIGHT HAND  Final   Special Requests BOTTLES DRAWN AEROBIC AND ANAEROBIC 10CC EA  Final   Culture  Setup Time  Final    01/16/2014 09:19 Performed at [facility]    Culture   Final           BLOOD CULTURE RECEIVED NO GROWTH TO DATE CULTURE WILL BE HELD FOR 5 DAYS BEFORE ISSUING A FINAL NEGATIVE REPORT Performed at [facility]    Report Status PENDING  Incomplete  Blood culture (routine x 2)     Status: None (Preliminary result)   Collection Time: 01/16/14  2:32 AM  Result Value Ref Range Status   Specimen Description BLOOD LEFT HAND  Final   Special Requests BOTTLES DRAWN AEROBIC AND ANAEROBIC 10CC EA  Final   Culture  Setup Time   Final    01/16/2014 09:19 Performed at [facility]    Culture   Final           BLOOD CULTURE RECEIVED NO GROWTH TO DATE CULTURE WILL BE HELD FOR 5 DAYS BEFORE ISSUING A FINAL NEGATIVE REPORT Performed at [facility]    Report Status PENDING  Incomplete     Studies:              All Imaging reviewed and is as  per above notation   Scheduled Meds: . heparin  5,000 Units Subcutaneous 3 times per day  . vancomycin  1,500 mg Intravenous Q12H   Continuous Infusions: . 0.9 % NaCl with KCl 20 mEq / L 100 mL/hr at 01/17/14 1905     Assessment/Plan: 1. Acute cellulitis of the left lower extremity-I will transition from IV vancomycin to by mouth Bactrim DS to complete a 10 day course of therapy. This is nonpurulent cellulitis. Repeat CBC plus differential in a.m. and continue wound marking. 2. Borderline diabetes mellitus-offered metformin for off label use of of increasing insulin sensitivity and weight loss-patient will think about it Body mass index is 55.5 kg/(m^2).-discussed with patient weight loss for about 10 minutes. Understands.  Code Status: Full Family Communication: None at bedside Disposition Plan: Inpatient   Pleas KochJai Aarish Rockers, MD  [facility] Pager [phone] 01/18/2014, 8:19 AM    LOS: 2 days

## 2014-01-18 NOTE — Plan of Care (Signed)
Problem: Phase II Progression Outcomes Goal: Progress activity as tolerated unless otherwise ordered Outcome: Completed/Met Date Met:  01/18/14 Goal: Discharge plan established Outcome: Completed/Met Date Met:  01/18/14 Goal: Temperature < 101 Outcome: Completed/Met Date Met:  01/18/14 Goal: Tolerating diet Outcome: Completed/Met Date Met:  01/18/14 Goal: Wound without signs/symptoms of infection, decreasing edema Outcome: Progressing

## 2014-01-19 LAB — COMPREHENSIVE METABOLIC PANEL
ALK PHOS: 81 U/L (ref 39–117)
ALT: 27 U/L (ref 0–53)
AST: 20 U/L (ref 0–37)
Albumin: 2.8 g/dL — ABNORMAL LOW (ref 3.5–5.2)
Anion gap: 16 — ABNORMAL HIGH (ref 5–15)
BUN: 12 mg/dL (ref 6–23)
CO2: 23 meq/L (ref 19–32)
Calcium: 9.2 mg/dL (ref 8.4–10.5)
Chloride: 100 mEq/L (ref 96–112)
Creatinine, Ser: 0.87 mg/dL (ref 0.50–1.35)
GFR calc non Af Amer: >90 mL/min (ref >90)
GLUCOSE: 82 mg/dL (ref 70–99)
Potassium: 4.5 mEq/L (ref 3.7–5.3)
Sodium: 139 mEq/L (ref 137–147)
Total Bilirubin: 0.3 mg/dL (ref 0.3–1.2)
Total Protein: 7 g/dL (ref 6.0–8.3)

## 2014-01-19 LAB — CBC WITH DIFFERENTIAL/PLATELET
Basophils Absolute: 0 10*3/uL (ref 0.0–0.1)
Basophils Relative: 0 % (ref 0–1)
EOS ABS: 0.1 10*3/uL (ref 0.0–0.7)
EOS PCT: 1 % (ref 0–5)
HCT: 32.5 % — ABNORMAL LOW (ref 39.0–52.0)
HEMOGLOBIN: 10.6 g/dL — AB (ref 13.0–17.0)
Lymphocytes Relative: 18 % (ref 12–46)
Lymphs Abs: 1.7 10*3/uL (ref 0.7–4.0)
MCH: 25.1 pg — AB (ref 26.0–34.0)
MCHC: 32.6 g/dL (ref 30.0–36.0)
MCV: 76.8 fL — AB (ref 78.0–100.0)
MONOS PCT: 5 % (ref 3–12)
Monocytes Absolute: 0.4 10*3/uL (ref 0.1–1.0)
Neutro Abs: 7.1 10*3/uL (ref 1.7–7.7)
Neutrophils Relative %: 76 % (ref 43–77)
Platelets: 346 10*3/uL (ref 150–400)
RBC: 4.23 MIL/uL (ref 4.22–5.81)
RDW: 14.8 % (ref 11.5–15.5)
WBC: 9.4 10*3/uL (ref 4.0–10.5)

## 2014-01-19 MED ORDER — METFORMIN HCL 500 MG PO TABS: 500.0000 mg | ORAL_TABLET | Freq: Two times a day (BID) | ORAL | Status: DC

## 2014-01-19 MED ORDER — SULFAMETHOXAZOLE-TRIMETHOPRIM 800-160 MG PO TABS: 1.0000 | ORAL_TABLET | Freq: Two times a day (BID) | ORAL | Status: DC

## 2014-01-19 NOTE — Plan of Care (Signed)
Problem: Phase I Progression Outcomes Goal: OOB as tolerated unless otherwise ordered Outcome: Completed/Met Date Met:  01/19/14 Goal: Initial discharge plan identified Outcome: Completed/Met Date Met:  01/19/14 Goal: Voiding-avoid urinary catheter unless indicated Outcome: Completed/Met Date Met:  01/19/14 Goal: Hemodynamically stable Outcome: Completed/Met Date Met:  01/19/14 Goal: Temperature < 102 Outcome: Completed/Met Date Met:  01/19/14

## 2014-01-19 NOTE — Plan of Care (Signed)
Problem: Phase I Progression Outcomes Goal: Pain controlled with appropriate interventions Outcome: Completed/Met Date Met:  01/19/14  Problem: Phase III Progression Outcomes Goal: Activity at appropriate level-compared to baseline (UP IN CHAIR FOR HEMODIALYSIS)  Outcome: Completed/Met Date Met:  01/19/14 Goal: Temperature < 100 Outcome: Completed/Met Date Met:  01/19/14 Goal: IV Meds changed to PO Outcome: Completed/Met Date Met:  01/19/14

## 2014-01-19 NOTE — Plan of Care (Signed)
Problem: Phase I Progression Outcomes Goal: Wound assessment- dressing change as appropriate Outcome: Completed/Met Date Met:  01/19/14  Problem: Phase II Progression Outcomes Goal: Wound without signs/symptoms of infection, decreasing edema Outcome: Completed/Met Date Met:  01/19/14

## 2014-01-19 NOTE — Discharge Summary (Signed)
Physician Discharge Summary  Samuel Rojas ONG:[id number] DOB: 02-19-1990 DOA: 01/16/2014  PCP: No primary care provider on file.  Admit date: 01/16/2014 Discharge date: 01/19/2014  Time spent: 15 minutes  Recommendations for Outpatient Follow-up:  1. Complete Bactrim DS 2. Get Bmet 1 week as on metformin now as this was started in the hopsital  3. Patient instructed to take a picture of foot on his smart phone and show pcp on follow-up  Discharge Diagnoses:  Active Problems:   Cellulitis   Discharge Condition: fair  Diet recommendation: Regular  Filed Weights   01/15/14 2309 01/16/14 0458  Weight: 154.223 kg (340 lb) 155.901 kg (343 lb 11.2 oz)    History of present illness:  23 y/o m DM ty 2, Morbid obesity, Body mass index is 55.5 kg/(m^2). [facility] for LE cellulitis. [facility] Impressive LLE swelling and worsening symptoms  Hospital Course:  1. :Acute cellulitis of the left lower extremity-I will transition from IV vancomycin to by mouth Bactrim DS to complete a 10 day course of therapy 01/25/14. This is nonpurulent cellulitis. His edema imporved by 50% at time of d/c 2. Borderline diabetes mellitus-offered metformin for off label use of of increasing insulin sensitivity and weight loss-started Metformin 500 bid with instruction for Bmet 1 week 5. Body mass index is 55.5 kg/(m^2).-discussed with patient weight loss for about 10 minutes. Understands.  Discharge Exam: Filed Vitals:   01/19/14 1222  BP: 134/68  Pulse: 86  Temp: 98.2 F (36.8 C)  Resp: 16    General: alert pleasant oriented in nad Cardiovascular: s1 s 2no m/r/g Respiratory: clear no added sound  Discharge Instructions You were cared for by a hospitalist during your hospital stay. If you have any questions about your discharge medications or the care you received while you were in the hospital after you are discharged, you can call the unit and asked to  speak with the hospitalist on call if the hospitalist that took care of you is not available. Once you are discharged, your primary care physician will handle any further medical issues. Please note that NO REFILLS for any discharge medications will be authorized once you are discharged, as it is imperative that you return to your primary care physician (or establish a relationship with a primary care physician if you do not have one) for your aftercare needs so that they can reassess your need for medications and monitor your lab values.  Discharge Instructions    Diet - low sodium heart healthy    Complete by:  As directed      Discharge instructions    Complete by:  As directed   Complete course of Bactrim DS on 12/17.  I have sent your prescriptions to your pharmacy. We have started Metformin 500 twice a day-this is an off label use for a diabetic medication for help kick-starting weight loss.  Please get some lab-work in about 1 week at your Primary MD office Good luck to you on your weight loss journey-You will do very well with a little diet and exercise     Increase activity slowly    Complete by:  As directed           Current Discharge Medication List    START taking these medications   Details  metFORMIN (GLUCOPHAGE) 500 MG tablet Take 1 tablet (500 mg total) by mouth 2 (two) times daily with a meal. Qty: 60 tablet, Refills: 0  CONTINUE these medications which have CHANGED   Details  sulfamethoxazole-trimethoprim (BACTRIM DS,SEPTRA DS) 800-160 MG per tablet Take 1 tablet by mouth every 12 (twelve) hours. Qty: 12 tablet, Refills: 0      CONTINUE these medications which have NOT CHANGED   Details  ibuprofen (ADVIL,MOTRIN) 800 MG tablet Take 800 mg by mouth 3 (three) times daily.       Allergies  Allergen Reactions  . Augmentin [Amoxicillin-Pot Clavulanate] Hives      The results of significant diagnostics from this hospitalization (including imaging,  microbiology, ancillary and laboratory) are listed below for reference.    Significant Diagnostic Studies: No results found.  Microbiology: Recent Results (from the past 240 hour(s))  Blood culture (routine x 2)     Status: None (Preliminary result)   Collection Time: 01/16/14  2:24 AM  Result Value Ref Range Status   Specimen Description BLOOD RIGHT HAND  Final   Special Requests BOTTLES DRAWN AEROBIC AND ANAEROBIC 10CC EA  Final   Culture  Setup Time   Final    01/16/2014 09:19 Performed at [facility]    Culture   Final           BLOOD CULTURE RECEIVED NO GROWTH TO DATE CULTURE WILL BE HELD FOR 5 DAYS BEFORE ISSUING A FINAL NEGATIVE REPORT Performed at [facility]    Report Status PENDING  Incomplete  Blood culture (routine x 2)     Status: None (Preliminary result)   Collection Time: 01/16/14  2:32 AM  Result Value Ref Range Status   Specimen Description BLOOD LEFT HAND  Final   Special Requests BOTTLES DRAWN AEROBIC AND ANAEROBIC 10CC EA  Final   Culture  Setup Time   Final    01/16/2014 09:19 Performed at [facility]    Culture   Final           BLOOD CULTURE RECEIVED NO GROWTH TO DATE CULTURE WILL BE HELD FOR 5 DAYS BEFORE ISSUING A FINAL NEGATIVE REPORT Performed at [facility]    Report Status PENDING  Incomplete     Labs: Basic Metabolic Panel:  Recent Labs Lab 01/16/14 0227 01/16/14 0348 01/16/14 0531 01/17/14 0320 01/18/14 0346 01/19/14 0422  NA 139  --  139 135* 136* 139  K 3.6*  --  3.5* 3.9 4.3 4.5  CL 100  --  100 98 99 100  CO2 23  --  26 22 20 23   GLUCOSE 92  --  105* 92 84 82  BUN 15  --  14 12 10 12   CREATININE 1.00 0.93 0.93 0.85 0.78 0.87  CALCIUM 8.7  --  8.8 8.7 9.4 9.2   Liver Function Tests:  Recent Labs Lab 01/16/14 0531 01/17/14 0320 01/18/14 0346 01/19/14 0422  AST 31 21 24 20   ALT 31 26 29 27   ALKPHOS 91 87 93 81  BILITOT 0.6 0.6 0.5 0.3  PROT 7.0 6.8 7.6 7.0  ALBUMIN 2.8* 2.6* 2.9*  2.8*   No results for input(s): LIPASE, AMYLASE in the last 168 hours. No results for input(s): AMMONIA in the last 168 hours. CBC:  Recent Labs Lab 01/16/14 0227 01/16/14 0348 01/16/14 0531 01/17/14 0320 01/18/14 0346 01/19/14 0422  WBC 9.4 9.1 8.3 8.3 9.4 9.4  NEUTROABS 6.5  --  5.9 5.6 6.6 7.1  HGB 10.7* 10.3* 10.4* 10.2* 11.0* 10.6*  HCT 31.1* 31.2* 31.5* 31.4* 33.2* 32.5*  MCV 76.4* 75.9* 77.4* 76.4* 78.1 76.8*  PLT 224  231 227 277 345 346   Cardiac Enzymes: No results for input(s): CKTOTAL, CKMB, CKMBINDEX, TROPONINI in the last 168 hours. BNP: BNP (last 3 results) No results for input(s): PROBNP in the last 8760 hours. CBG: No results for input(s): GLUCAP in the last 168 hours.     SignedRhetta Mura  [facility] 01/19/2014, 2:15 PM

## 2014-01-19 NOTE — Discharge Planning (Signed)
Copy of AVS to pt who verbalizes understanding.  Will dc amb to private car home with all personal belongings acomp. By Mom.

## 2014-01-22 LAB — CULTURE, BLOOD (ROUTINE X 2)
Culture: NO GROWTH
Culture: NO GROWTH

## 2015-06-29 ENCOUNTER — Encounter (HOSPITAL_COMMUNITY): Payer: Self-pay

## 2015-06-29 DIAGNOSIS — R2242 Localized swelling, mass and lump, left lower limb: Secondary | ICD-10-CM | POA: Insufficient documentation

## 2015-06-29 NOTE — ED Notes (Signed)
Pt reports has had swelling in left leg since Dec 2015, h/o cellulitis in the lower left leg.  Onset tonight left lower leg weeping fluid.  Pt urinating WNL.

## 2015-06-30 ENCOUNTER — Emergency Department (HOSPITAL_COMMUNITY)
Admission: EM | Admit: 2015-06-30 | Discharge: 2015-06-30 | Disposition: A | Discharge status: Home / Self Care | Payer: Self-pay | Attending: Emergency Medicine | Admitting: Emergency Medicine

## 2015-06-30 ENCOUNTER — Encounter: Payer: Self-pay | Admitting: *Deleted

## 2015-06-30 ENCOUNTER — Emergency Department
Admission: EM | Admit: 2015-06-30 | Discharge: 2015-06-30 | Disposition: A | Discharge status: Home / Self Care | Payer: Self-pay | Attending: Emergency Medicine | Admitting: Emergency Medicine

## 2015-06-30 DIAGNOSIS — Z791 Long term (current) use of non-steroidal anti-inflammatories (NSAID): Secondary | ICD-10-CM | POA: Insufficient documentation

## 2015-06-30 DIAGNOSIS — D696 Thrombocytopenia, unspecified: Secondary | ICD-10-CM | POA: Insufficient documentation

## 2015-06-30 DIAGNOSIS — Z7984 Long term (current) use of oral hypoglycemic drugs: Secondary | ICD-10-CM | POA: Insufficient documentation

## 2015-06-30 DIAGNOSIS — I872 Venous insufficiency (chronic) (peripheral): Secondary | ICD-10-CM | POA: Insufficient documentation

## 2015-06-30 DIAGNOSIS — I890 Lymphedema, not elsewhere classified: Secondary | ICD-10-CM | POA: Insufficient documentation

## 2015-06-30 DIAGNOSIS — Z792 Long term (current) use of antibiotics: Secondary | ICD-10-CM | POA: Insufficient documentation

## 2015-06-30 HISTORY — DX: Cellulitis, unspecified: L03.90

## 2015-06-30 LAB — CBC WITH DIFFERENTIAL/PLATELET
BASOS ABS: 0 10*3/uL (ref 0–0.1)
Eosinophils Absolute: 0.2 10*3/uL (ref 0–0.7)
HCT: 40.5 % (ref 40.0–52.0)
Hemoglobin: 13.2 g/dL (ref 13.0–18.0)
Lymphocytes Relative: 31 %
Lymphs Abs: 2 10*3/uL (ref 1.0–3.6)
MCH: 25.2 pg — ABNORMAL LOW (ref 26.0–34.0)
MCHC: 32.6 g/dL (ref 32.0–36.0)
MCV: 77.1 fL — ABNORMAL LOW (ref 80.0–100.0)
Monocytes Absolute: 0.2 10*3/uL (ref 0.2–1.0)
Monocytes Relative: 3 %
Neutro Abs: 4.1 10*3/uL (ref 1.4–6.5)
PLATELETS: 42 10*3/uL — AB (ref 150–440)
RBC: 5.25 MIL/uL (ref 4.40–5.90)
RDW: 14.6 % — ABNORMAL HIGH (ref 11.5–14.5)
WBC: 6.5 10*3/uL (ref 3.8–10.6)

## 2015-06-30 LAB — COMPREHENSIVE METABOLIC PANEL
ALBUMIN: 4.2 g/dL (ref 3.5–5.0)
ALT: 26 U/L (ref 17–63)
AST: 20 U/L (ref 15–41)
Alkaline Phosphatase: 74 U/L (ref 38–126)
Anion gap: 6 (ref 5–15)
BUN: 18 mg/dL (ref 6–20)
CO2: 25 mmol/L (ref 22–32)
CREATININE: 0.94 mg/dL (ref 0.61–1.24)
Calcium: 8.8 mg/dL — ABNORMAL LOW (ref 8.9–10.3)
Chloride: 107 mmol/L (ref 101–111)
GFR calc non Af Amer: >60 mL/min (ref >60)
GLUCOSE: 108 mg/dL — AB (ref 65–99)
Potassium: 3.7 mmol/L (ref 3.5–5.1)
SODIUM: 138 mmol/L (ref 135–145)
Total Bilirubin: 0.5 mg/dL (ref 0.3–1.2)
Total Protein: 7.8 g/dL (ref 6.5–8.1)

## 2015-06-30 MED ORDER — FUROSEMIDE 20 MG PO TABS: 20.0000 mg | ORAL_TABLET | Freq: Every day | ORAL | Status: DC

## 2015-06-30 MED ORDER — FUROSEMIDE 40 MG PO TABS
20.0000 mg | ORAL_TABLET | Freq: Once | ORAL | Status: AC
  Administered 2015-06-30: 20 mg via ORAL
  Filled 2015-06-30: qty 1

## 2015-06-30 MED ORDER — CLINDAMYCIN HCL 300 MG PO CAPS: 300.0000 mg | ORAL_CAPSULE | Freq: Three times a day (TID) | ORAL | Status: DC

## 2015-06-30 MED ORDER — CLINDAMYCIN HCL 150 MG PO CAPS
300.0000 mg | ORAL_CAPSULE | Freq: Once | ORAL | Status: AC
  Administered 2015-06-30: 300 mg via ORAL
  Filled 2015-06-30: qty 2

## 2015-06-30 NOTE — ED Notes (Signed)
Pt discharged to home.  Family member driving.  Discharge instructions reviewed.  Verbalized understanding.  No questions or concerns at this time.  Teach back verified.  Pt in NAD.  No items left in ED.   

## 2015-06-30 NOTE — ED Notes (Signed)
Pt has hx of cellulitis in L leg. Pt states L leg has remained swollen since having cellulitis in 01/2014. Pt has wound on lateral posterior L ankle that is leaking fluid.

## 2015-06-30 NOTE — ED Provider Notes (Signed)
[facility] Emergency Department Provider Note   ____________________________________________  Time seen: Approximately 420 AM  I have reviewed the triage vital signs and the nursing notes.   HISTORY  Chief Complaint Wound Check    HPI Samuel Rojas is a 25 y.o. male who comes into the hospital today with some draining from his left leg. He reports that he has had some draining from the medial and posterior portions of his legs since about 4 PM. He reports that he has no leg pain in his leg has been swollen for 2 years. He reports that he's had leg swelling in the past and was seen by a vascular physician who encouraged him to use compression stockings. He reports that the legs improved but then he developed cellulitis in his left leg and the swelling returned. This was approximately 2 years ago. Patient reports that the drainage is clear. He denies any injury to his leg. He has no shortness of breath no dizziness no chest pain or headache. The patient reports that he has good sensation in his legs and his toes. The patient is here for evaluation.   Past Medical History  Diagnosis Date  . Cellulitis     left leg    Patient Active Problem List   Diagnosis Date Noted  . Cellulitis 01/16/2014    Past Surgical History  Procedure Laterality Date  . Tonsillectomy      Current Outpatient Rx  Name  Route  Sig  Dispense  Refill  . clindamycin (CLEOCIN) 300 MG capsule   Oral   Take 1 capsule (300 mg total) by mouth 3 (three) times daily.   30 capsule   0   . furosemide (LASIX) 20 MG tablet   Oral   Take 1 tablet (20 mg total) by mouth daily.   10 tablet   0   . ibuprofen (ADVIL,MOTRIN) 800 MG tablet   Oral   Take 800 mg by mouth 3 (three) times daily.         . metFORMIN (GLUCOPHAGE) 500 MG tablet   Oral   Take 1 tablet (500 mg total) by mouth 2 (two) times daily with a meal.   60 tablet   0   . sulfamethoxazole-trimethoprim  (BACTRIM DS,SEPTRA DS) 800-160 MG per tablet   Oral   Take 1 tablet by mouth every 12 (twelve) hours.   12 tablet   0     Allergies Augmentin  History reviewed. No pertinent family history.  Social History Social History  Substance Use Topics  . Smoking status: Never Smoker   . Smokeless tobacco: Never Used  . Alcohol Use: No    Review of Systems Constitutional: No fever/chills Eyes: No visual changes. ENT: No sore throat. Cardiovascular: Denies chest pain. Respiratory: Denies shortness of breath. Gastrointestinal: No abdominal pain.  No nausea, no vomiting.  No diarrhea.  No constipation. Genitourinary: Negative for dysuria. Musculoskeletal: Negative for back pain. Skin: Left leg drainage Neurological: Negative for headaches, focal weakness or numbness.  10-point ROS otherwise negative.  ____________________________________________   PHYSICAL EXAM:  VITAL SIGNS: ED Triage Vitals  Enc Vitals Group     BP 06/30/15 0102 153/86 mmHg     Pulse Rate 06/30/15 0102 79     Resp 06/30/15 0102 18     Temp 06/30/15 0102 98.5 F (36.9 C)     Temp Source 06/30/15 0102 Oral     SpO2 06/30/15 0102 99 %     Weight  06/30/15 0102 364 lb (165.109 kg)     Height 06/30/15 0102 5\' 6"  (1.676 m)     Head Cir --      Peak Flow --      Pain Score --      Pain Loc --      Pain Edu? --      Excl. in GC? --     Constitutional: Alert and oriented. Well appearing and in no acute distress. Eyes: Conjunctivae are normal. PERRL. EOMI. Head: Atraumatic. Nose: No congestion/rhinnorhea. Mouth/Throat: Mucous membranes are moist.  Oropharynx non-erythematous. Cardiovascular: Normal rate, regular rhythm. Grossly normal heart sounds.  Good peripheral circulation. Respiratory: Normal respiratory effort.  No retractions. Lungs CTAB. Gastrointestinal: Soft and nontender. No distention. Positive bowel sounds Musculoskeletal: no distinct warmth or tenderness to the patient's lower extremity.    Neurologic:  Normal speech and language.  Skin:  Venous stasis changes to bilateral lower extremities with some area of weeping with no distinct abscess no distinct erythema  Psychiatric: Mood and affect are normal.   ____________________________________________   LABS (all labs ordered are listed, but only abnormal results are displayed)  Labs Reviewed  CBC WITH DIFFERENTIAL/PLATELET - Abnormal; Notable for the following:    MCV 77.1 (*)    MCH 25.2 (*)    RDW 14.6 (*)    Platelets 42 (*)    All other components within normal limits  COMPREHENSIVE METABOLIC PANEL - Abnormal; Notable for the following:    Glucose, Bld 108 (*)    Calcium 8.8 (*)    All other components within normal limits  CULTURE, BLOOD (ROUTINE X 2)  CULTURE, BLOOD (ROUTINE X 2)   ____________________________________________  EKG  none ____________________________________________  RADIOLOGY  none ____________________________________________   PROCEDURES  Procedure(s) performed: None  Critical Care performed: No  ____________________________________________   INITIAL IMPRESSION / ASSESSMENT AND PLAN / ED COURSE  Pertinent labs & imaging results that were available during my care of the patient were reviewed by me and considered in my medical decision making (see chart for details).  This is a 25 year old male who comes into the hospital today with some drainage from his lower extremity. The patient appears to have some lymphedema of his left lower extremity with some venous stasis changes. The patient is having some weeping from his posterior leg with no distinct abscess. The patient also has no tenderness to palpation. He reports that his leg has not gotten any more swollen in from 2 years ago. He reports he's never had this but he does not use compression stockings at home. I will give the patient a dose of Lasix as well as clindamycin for possible infection. The patient has no bloody drainage.  I will have him follow back up with the acute care clinic. The patient does have some low platelets but He is asymptomatic with no bleeding or bruising and no other concerns. I strongly encouraged the patient to follow up to have his platelets reevaluated and ensure that they are not continuing to decline. Otherwise the patient will be discharged home.  He will be discharged home. ____________________________________________   FINAL CLINICAL IMPRESSION(S) / ED DIAGNOSES  Final diagnoses:  Lymphedema of left lower extremity  Venous stasis dermatitis of left lower extremity  Thrombocytopenia (HCC)      NEW MEDICATIONS STARTED DURING THIS VISIT:  New Prescriptions   CLINDAMYCIN (CLEOCIN) 300 MG CAPSULE    Take 1 capsule (300 mg total) by mouth 3 (three) times daily.  FUROSEMIDE (LASIX) 20 MG TABLET    Take 1 tablet (20 mg total) by mouth daily.     Note:  This document was prepared using Dragon voice recognition software and may include unintentional dictation errors.    Rebecka Apley, MD 06/30/15 [phone]

## 2015-06-30 NOTE — Discharge Instructions (Signed)
Lymphedema Lymphedema is swelling that is caused by the abnormal collection of lymph under the skin. Lymph is fluid from the tissues in your body that travels in the lymphatic system. This system is part of the immune system and includes lymph nodes and lymph vessels. The lymph vessels collect and carry the excess fluid, fats, proteins, and wastes from the tissues of the body to the bloodstream. This system also works to clean and remove bacteria and waste products from the body. Lymphedema occurs when the lymphatic system is blocked. When the lymph vessels or lymph nodes are blocked or damaged, lymph does not drain properly, causing an abnormal buildup of lymph. This leads to swelling in the arms or legs. Lymphedema cannot be cured by medicines, but various methods can be used to help reduce the swelling. CAUSES There are two types of lymphedema. Primary lymphedema is caused by the absence or abnormality of the lymph vessel at birth. Secondary lymphedema is more common. It occurs when the lymph vessel is damaged or blocked. Common causes of lymph vessel blockage include:  Skin infection, such as cellulitis.  Infection by parasites (filariasis).  Injury.  Cancer.  Radiation therapy.  Formation of scar tissue.  Surgery. SYMPTOMS Symptoms of this condition include:  Swelling of the arm or leg.  A heavy or tight feeling in the arm or leg.  Swelling of the feet, toes, or fingers. Shoes or rings may fit more tightly than before.  Redness of the skin over the affected area.  Limited movement of the affected limb.  Sensitivity to touch or discomfort in the affected limb. DIAGNOSIS This condition may be diagnosed with:  A physical exam.  Medical history.  Imaging tests, such as:  Lymphoscintigraphy. In this test, a low dose of a radioactive substance is injected to trace the flow of lymph through the lymph vessels.  MRI.  CT scan.  Duplex ultrasound. This test uses sound waves  to produce images of the vessels and the blood flow on a screen.  Lymphangiography. In this test, a contrast dye is injected into the lymph vessel to help show blockages. TREATMENT Treatment for this condition may depend on the cause. Treatment may include:  Exercise. Certain exercises can help fluid move out of the affected limb.  Massage. Gentle massage of the affected limb can help move the fluid out of the area.  Compression. Various methods may be used to apply pressure to the affected limb in order to reduce the swelling.  Wearing compression stockings or sleeves on the affected limb.  Bandaging the affected limb.  Using an external pump that is attached to a sleeve that alternates between applying pressure and releasing pressure.  Surgery. This is usually only done for severe cases. For example, surgery may be done if you have trouble moving the limb or if the swelling does not get better with other treatments. If an underlying condition is causing the lymphedema, treatment for that condition is needed. For example, antibiotic medicines may be used to treat an infection. HOME CARE INSTRUCTIONS Activities  Exercise regularly as directed by your health care provider.  Do not sit with your legs crossed.  When possible, keep the affected limb raised (elevated) above the level of your heart.  Avoid carrying things with an arm that is affected by lymphedema.  Remember that the affected area is more likely to become injured or infected.  Take these steps to help prevent infection:  Keep the affected area clean and dry.  Protect  your skin from cuts. For example, you should use gloves while cooking or gardening. Do not walk barefoot. If you shave the affected area, use an Neurosurgeon. General Instructions  Take medicines only as directed by your health care provider.  Eat a healthy diet that includes a lot of fruits and vegetables.  Do not wear tight clothes, shoes, or  jewelry.  Do not use heating pads over the affected area.  Avoid having blood pressure checked on the affected limb.  Keep all follow-up visits as directed by your health care provider. This is important. SEEK MEDICAL CARE IF:  You continue to have swelling in your limb.  You have a fever.  You have a cut that does not heal.  You have redness or pain in the affected area.  You have new swelling in your limb that comes on suddenly.  You develop purplish spots or sores (lesions) on your limb. SEEK IMMEDIATE MEDICAL CARE IF:  You have a skin rash.  You have chills or sweats.  You have shortness of breath.   This information is not intended to replace advice given to you by your health care provider. Make sure you discuss any questions you have with your health care provider.   Document Released: 11/23/2006 Document Revised: 06/12/2014 Document Reviewed: 01/03/2014 Elsevier Interactive Patient Education 2016 [employer]  Stasis Dermatitis Stasis dermatitis occurs when veins lose the ability to pump blood back to the heart (poor venous circulation). It causes a reddish-purple to brownish scaly, itchy rash on the legs. The rash comes from pooling of blood (stasis). CAUSES  This occurs because the veins do not work very well anymore or because pressure may be increased in the veins due to other conditions. With blood pooling, the increased pressure in the tiny blood vessels (capillaries) causes fluid to leak out of the capillaries into the tissue. The extra fluid makes it harder for the blood to feed the cells and get rid of waste products. SYMPTOMS  Stasis dermatitis appears as red, scaly, itchy patches on the legs. A yellowish or light brown discoloration is also present. Due to scratching or other injury, these patches can become an ulcer. This ulcer may remain for long periods of time. The ulcer can also become infected. Swelling of the legs is often present with stasis  dermatitis. If the leg is swollen, this increases the risk of infection and further damage to the skin. Sometimes, intense itching, tingling, and burning occurs before signs of stasis dermatitis appear. You may find yourself scratching the insides of your ankles or rubbing your ankles together before the rash appears. After healing, there are often brown spots on the affected skin. DIAGNOSIS  Your caregiver makes this diagnosis based on an exam. Other tests may be done to better understand the cause. TREATMENT  If underlying conditions are present, they must be treated. Some of these conditions are heart failure, thyroid problems, poor nutrition, and varicose veins.  Cortisone creams and ointments applied to the skin (topically) may be needed, as well as medicine to reduce swelling in the legs (diuretics).  Compression stockings or an elastic wrap may also be needed to reduce swelling.  If there is an infection, antibiotic medicines may also be used. HOME CARE INSTRUCTIONS   Try to rest and raise (elevate) the affected leg above the level of the heart, if possible.  Burow's solution wet packs applied for 30 minutes, 3 times daily, will help the weepy rash. Stop using the packs before  your skin gets too dry. You can also use a mixture of 3 parts white vinegar to 1 quart water.  Grease your legs daily with ointments, such as petroleum jelly, to fight dryness.  Avoid scratching or injuring the area. SEEK IMMEDIATE MEDICAL CARE IF:   Your rash gets worse.  An ulcer forms.  You have an oral temperature above 102 F (38.9 C), not controlled by medicine.  You have any other severe symptoms.   This information is not intended to replace advice given to you by your health care provider. Make sure you discuss any questions you have with your health care provider.   Document Released: 05/07/2005 Document Revised: 04/20/2011 Document Reviewed: 06/13/2014 Elsevier Interactive Patient Education  2016 [employer]  Thrombocytopenia Thrombocytopenia is a condition in which there is an abnormally small number of platelets in your blood. Platelets are also called thrombocytes. Platelets are needed for blood clotting. CAUSES Thrombocytopenia is caused by:   Decreased production of platelets. This can be caused by:  Aplastic anemia in which your bone marrow quits making blood cells.  Cancer in the bone marrow.  Use of certain medicines, including chemotherapy.  Infection in the bone marrow.  Heavy alcohol consumption.  Increased destruction of platelets. This can be caused by:  Certain immune diseases.  Use of certain drugs.  Certain blood clotting disorders.  Certain inherited disorders.  Certain bleeding disorders.  Pregnancy.  Having an enlarged spleen (hypersplenism). In hypersplenism, the spleen gathers up platelets from circulation. This means the platelets are not available to help with blood clotting. The spleen can enlarge due to cirrhosis or other conditions. SYMPTOMS  The symptoms of thrombocytopenia are side effects of poor blood clotting. Some of these are:  Abnormal bleeding.  Nosebleeds.  Heavy menstrual periods.  Blood in the urine or stools.  Purpura. This is a purplish discoloration in the skin produced by small bleeding vessels near the surface of the skin.  Bruising.  A rash that may be petechial. This looks like pinpoint, purplish-red spots on the skin and mucous membranes. It is caused by bleeding from small blood vessels (capillaries). DIAGNOSIS  Your caregiver will make this diagnosis based on your exam and blood tests. Sometimes, a bone marrow study is done to look for the original cells (megakaryocytes) that make platelets. TREATMENT  Treatment depends on the cause of the condition.  Medicines may be given to help protect your platelets from being destroyed.  In some cases, a replacement (transfusion) of platelets may be required  to stop or prevent bleeding.  Sometimes, the spleen must be surgically removed. HOME CARE INSTRUCTIONS   Check the skin and linings inside your mouth for bruising or bleeding as directed by your caregiver.  Check your sputum, urine, and stool for blood as directed by your caregiver.  Do not return to any activities that could cause bumps or bruises until your caregiver says it is okay.  Take extra care not to cut yourself when shaving or when using scissors, needles, knives, and other tools.  Take extra care not to burn yourself when ironing or cooking.  Ask your caregiver if it is okay for you to drink alcohol.  Only take over-the-counter or prescription medicines as directed by your caregiver.  Notify all your caregivers, including dentists and eye doctors, about your condition. SEEK IMMEDIATE MEDICAL CARE IF:   You develop active bleeding from anywhere in your body.  You develop unexplained bruising or bleeding.  You have blood in your  sputum, urine, or stool. MAKE SURE YOU:  Understand these instructions.  Will watch your condition.  Will get help right away if you are not doing well or get worse.   This information is not intended to replace advice given to you by your health care provider. Make sure you discuss any questions you have with your health care provider.   Document Released: 01/26/2005 Document Revised: 04/20/2011 Document Reviewed: 07/30/2014 Elsevier Interactive Patient Education [employer]

## 2015-07-03 ENCOUNTER — Telehealth: Payer: Self-pay | Admitting: Emergency Medicine

## 2015-07-03 NOTE — ED Notes (Signed)
[facility] [place] called to ask abotu changing clindamycin to cheaper med on mon eve.  i called [facility] and they said pt never did get the med.  I called patient and explained that he can get hte med for free at [facility].  He will do that.

## 2015-07-05 LAB — CULTURE, BLOOD (ROUTINE X 2)
CULTURE: NO GROWTH
Culture: NO GROWTH

## 2015-08-15 ENCOUNTER — Inpatient Hospital Stay: Payer: Self-pay | Admitting: Oncology

## 2015-09-09 ENCOUNTER — Encounter: Payer: Self-pay | Admitting: Oncology

## 2015-09-09 ENCOUNTER — Inpatient Hospital Stay: Payer: Self-pay

## 2015-09-09 ENCOUNTER — Inpatient Hospital Stay: Payer: Self-pay | Attending: Oncology | Admitting: Oncology

## 2015-09-09 VITALS — BP 150/98 | HR 105 | Temp 97.8°F | Resp 20 | Ht 66.0 in | Wt 362.4 lb

## 2015-09-09 DIAGNOSIS — I10 Essential (primary) hypertension: Secondary | ICD-10-CM

## 2015-09-09 DIAGNOSIS — D696 Thrombocytopenia, unspecified: Secondary | ICD-10-CM

## 2015-09-09 DIAGNOSIS — Z79899 Other long term (current) drug therapy: Secondary | ICD-10-CM

## 2015-09-09 LAB — CBC
HCT: 40.2 % (ref 40.0–52.0)
Hemoglobin: 13.3 g/dL (ref 13.0–18.0)
MCH: 25.4 pg — ABNORMAL LOW (ref 26.0–34.0)
MCHC: 33 g/dL (ref 32.0–36.0)
MCV: 76.9 fL — ABNORMAL LOW (ref 80.0–100.0)
Platelets: 31 K/uL — ABNORMAL LOW (ref 150–440)
RBC: 5.23 MIL/uL (ref 4.40–5.90)
RDW: 14.8 % — ABNORMAL HIGH (ref 11.5–14.5)
WBC: 5.1 K/uL (ref 3.8–10.6)

## 2015-09-09 LAB — IRON AND TIBC
IRON: 50 ug/dL (ref 45–182)
Saturation Ratios: 16 % — ABNORMAL LOW (ref 17.9–39.5)
TIBC: 320 ug/dL (ref 250–450)
UIBC: 270 ug/dL

## 2015-09-09 LAB — LACTATE DEHYDROGENASE: LDH: 125 U/L (ref 98–192)

## 2015-09-09 LAB — VITAMIN B12: VITAMIN B 12: 290 pg/mL (ref 180–914)

## 2015-09-09 LAB — FOLATE: Folate: 11.2 ng/mL (ref >5.9)

## 2015-09-09 LAB — FERRITIN: Ferritin: 75 ng/mL (ref 24–336)

## 2015-09-09 NOTE — Progress Notes (Signed)
[facility]  [phone]) [phone] Fax:(336) [phone]  ID: Samuel Rojas OB: 29-Aug-1990  MR#: [id number]  EHM#:[id number]  No care team member to display  CHIEF COMPLAINT: Thrombocytopenia.  INTERVAL HISTORY: Patient is a 25 year old male who was noted to have a significantly decreased platelet count in May 2017 a 42. Repeat laboratory work one month later showed only mild improvement 53. He had a normal platelet count in 2015. Patient reports occasional nosebleeds, but otherwise feels well. He has no neurologic complaints. He denies any recent fevers or illnesses. He has a good appetite and denies weight loss. He has no chest pain or shortness of breath. He denies any nausea, vomiting, constipation, or diarrhea. He has no urinary complaints. Patient otherwise feels well and offers no further specific complaints.  REVIEW OF SYSTEMS:   Review of Systems  Constitutional: Negative.  Negative for fever, malaise/fatigue and weight loss.  HENT: Positive for nosebleeds.   Respiratory: Negative.  Negative for cough, hemoptysis and shortness of breath.   Cardiovascular: Negative.  Negative for chest pain.  Gastrointestinal: Negative.  Negative for abdominal pain, blood in stool and melena.  Genitourinary: Negative.  Negative for hematuria.  Musculoskeletal: Negative.   Neurological: Negative.  Negative for weakness.  Endo/Heme/Allergies: Does not bruise/bleed easily.  Psychiatric/Behavioral: Negative.     As per HPI. Otherwise, a complete review of systems is negatve.  PAST MEDICAL HISTORY: Past Medical History:  Diagnosis Date  . Cellulitis    left leg    PAST SURGICAL HISTORY: Past Surgical History:  Procedure Laterality Date  . TONSILLECTOMY      FAMILY HISTORY: Family History  Problem Relation Age of Onset  . Hypertension Mother   . Diabetes Mother        ADVANCED DIRECTIVES:    HEALTH MAINTENANCE: Social History  Substance Use Topics  .  Smoking status: Never Smoker  . Smokeless tobacco: Never Used  . Alcohol use No     Colonoscopy:  PAP:  Bone density:  Lipid panel:  Allergies  Allergen Reactions  . Augmentin [Amoxicillin-Pot Clavulanate] Hives    Current Outpatient Prescriptions  Medication Sig Dispense Refill  . furosemide (LASIX) 20 MG tablet Take 1 tablet (20 mg total) by mouth daily. 10 tablet 0  . ibuprofen (ADVIL,MOTRIN) 800 MG tablet Take 800 mg by mouth 3 (three) times daily.     No current facility-administered medications for this visit.     OBJECTIVE: Vitals:   09/09/15 1516  BP: (!) 150/98  Pulse: (!) 105  Resp: 20  Temp: 97.8 F (36.6 C)     Body mass index is 58.5 kg/m.    ECOG FS:0 - Asymptomatic  General: Well-developed, well-nourished, no acute distress. Eyes: Pink conjunctiva, anicteric sclera. HEENT: Normocephalic, moist mucous membranes, clear oropharnyx. Lungs: Clear to auscultation bilaterally. Heart: Regular rate and rhythm. No rubs, murmurs, or gallops. Abdomen: Soft, nontender, nondistended. No organomegaly noted, normoactive bowel sounds. Musculoskeletal: No edema, cyanosis, or clubbing. Neuro: Alert, answering all questions appropriately. Cranial nerves grossly intact. Skin: No rashes or petechiae noted. Psych: Normal affect. Lymphatics: No cervical, calvicular, axillary or inguinal LAD.   LAB RESULTS:  Lab Results  Component Value Date   NA 138 06/30/2015   K 3.7 06/30/2015   CL 107 06/30/2015   CO2 25 06/30/2015   GLUCOSE 108 (H) 06/30/2015   BUN 18 06/30/2015   CREATININE 0.94 06/30/2015   CALCIUM 8.8 (L) 06/30/2015   PROT 7.8 06/30/2015   ALBUMIN 4.2 06/30/2015  AST 20 06/30/2015   ALT 26 06/30/2015   ALKPHOS 74 06/30/2015   BILITOT 0.5 06/30/2015   GFRNONAA >60 06/30/2015   GFRAA >60 06/30/2015    Lab Results  Component Value Date   WBC 5.1 09/09/2015   NEUTROABS 4.1 06/30/2015   HGB 13.3 09/09/2015   HCT 40.2 09/09/2015   MCV 76.9 (L)  09/09/2015   PLT 31 (L) 09/09/2015   Lab Results  Component Value Date   IRON 50 09/09/2015   TIBC 320 09/09/2015   IRONPCTSAT 16 (L) 09/09/2015   Lab Results  Component Value Date   FERRITIN 75 09/09/2015     STUDIES: No results found.  ASSESSMENT: Thrombocytopenia.  PLAN:     1. Thrombocytopenia: Patient's platelet count remains significantly decreased at 31 today. 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 Iron stores are normal from today, but the remainder of his laboratory work is pending at time of dictation. We will get abdominal ultrasound to assess for splenomegaly. Patient will likely require a bone marrow biopsy in the future to confirm the suspected diagnosis. No intervention is needed at this time. Return to clinic in 4 weeks to discuss his laboratory results and additional diagnostic planning. 2. Hypertension: Patient's blood pressure is mildly elevated today. Monitor and defer treatment primary care physician.  Patient expressed understanding and was in agreement with this plan. He also understands that He can call clinic at any time with any questions, concerns, or complaints.   No matching staging information was found for the patient.  Lloyd Huger, MD   09/09/2015 8:27 PM

## 2015-09-09 NOTE — Progress Notes (Signed)
Pt reports fatigue that comes and goes.  Bleeding from nose maybe every week to every other week.

## 2015-09-10 LAB — ANA W/REFLEX: ANA: NEGATIVE

## 2015-09-10 LAB — HAPTOGLOBIN: HAPTOGLOBIN: 301 mg/dL — AB (ref 34–200)

## 2015-09-10 LAB — PLATELET ANTIBODY PROFILE, SERUM
HLA Ab Ser Ql EIA: NEGATIVE
IA/IIA Antibody: NEGATIVE
IB/IX ANTIBODY: NEGATIVE
IIB/IIIA Antibody: NEGATIVE

## 2015-09-16 ENCOUNTER — Ambulatory Visit
Admission: RE | Admit: 2015-09-16 | Discharge: 2015-09-16 | Disposition: A | Discharge status: Home / Self Care | Payer: Self-pay | Source: Ambulatory Visit | Attending: Oncology | Admitting: Oncology

## 2015-09-16 DIAGNOSIS — K760 Fatty (change of) liver, not elsewhere classified: Secondary | ICD-10-CM | POA: Insufficient documentation

## 2015-09-16 DIAGNOSIS — D696 Thrombocytopenia, unspecified: Secondary | ICD-10-CM | POA: Insufficient documentation

## 2015-10-07 ENCOUNTER — Inpatient Hospital Stay: Payer: Self-pay | Attending: Oncology

## 2015-10-07 ENCOUNTER — Inpatient Hospital Stay (HOSPITAL_BASED_OUTPATIENT_CLINIC_OR_DEPARTMENT_OTHER): Payer: Self-pay | Admitting: Oncology

## 2015-10-07 ENCOUNTER — Ambulatory Visit: Payer: Self-pay | Admitting: Oncology

## 2015-10-07 ENCOUNTER — Encounter (INDEPENDENT_AMBULATORY_CARE_PROVIDER_SITE_OTHER): Payer: Self-pay

## 2015-10-07 ENCOUNTER — Other Ambulatory Visit: Payer: Self-pay

## 2015-10-07 VITALS — BP 156/97 | HR 79 | Temp 98.6°F | Resp 18 | Wt 365.5 lb

## 2015-10-07 DIAGNOSIS — D696 Thrombocytopenia, unspecified: Secondary | ICD-10-CM

## 2015-10-07 DIAGNOSIS — Z79899 Other long term (current) drug therapy: Secondary | ICD-10-CM

## 2015-10-07 DIAGNOSIS — I120 Hypertensive chronic kidney disease with stage 5 chronic kidney disease or end stage renal disease: Secondary | ICD-10-CM | POA: Insufficient documentation

## 2015-10-07 LAB — CBC WITH DIFFERENTIAL/PLATELET
BASOS ABS: 0 10*3/uL (ref 0–0.1)
Eosinophils Absolute: 0.2 10*3/uL (ref 0–0.7)
Eosinophils Relative: 3 %
HEMATOCRIT: 39.2 % — AB (ref 40.0–52.0)
HEMOGLOBIN: 12.7 g/dL — AB (ref 13.0–18.0)
Lymphs Abs: 1.4 10*3/uL (ref 1.0–3.6)
MCH: 24.9 pg — ABNORMAL LOW (ref 26.0–34.0)
MCHC: 32.4 g/dL (ref 32.0–36.0)
MCV: 76.9 fL — AB (ref 80.0–100.0)
Monocytes Absolute: 0.2 10*3/uL (ref 0.2–1.0)
NEUTROS ABS: 4.3 10*3/uL (ref 1.4–6.5)
Platelets: 36 10*3/uL — ABNORMAL LOW (ref 150–440)
RBC: 5.1 MIL/uL (ref 4.40–5.90)
RDW: 15.5 % — ABNORMAL HIGH (ref 11.5–14.5)
WBC: 6.1 10*3/uL (ref 3.8–10.6)

## 2015-10-07 MED ORDER — PREDNISONE 50 MG PO TABS: 100.0000 mg | ORAL_TABLET | Freq: Every day | ORAL | 0 refills | Status: DC

## 2015-10-07 NOTE — Progress Notes (Signed)
[facility]  Telephone:(336) [phone] Fax:(336) [phone]  ID: GREGREY BLOYD OB: 1990/03/25  MR#: [id number]  GNO#:[id number]  Patient Care Team: [facility] as PCP - General (General Practice)  CHIEF COMPLAINT: Thrombocytopenia.  INTERVAL HISTORY: Patient returns to clinic today for repeat laboratory work and further evaluation. He currently feels well and is asymptomatic. He does not complain of any easy bleeding or bruising. He has no neurologic complaints. He denies any recent fevers or illnesses. He has a good appetite and denies weight loss. He has no chest pain or shortness of breath. He denies any nausea, vomiting, constipation, or diarrhea. He has no urinary complaints. Patient offers no specific complaints today.  REVIEW OF SYSTEMS:   Review of Systems  Constitutional: Negative.  Negative for fever, malaise/fatigue and weight loss.  HENT: Negative for nosebleeds.   Respiratory: Negative.  Negative for cough, hemoptysis and shortness of breath.   Cardiovascular: Negative.  Negative for chest pain.  Gastrointestinal: Negative.  Negative for abdominal pain, blood in stool and melena.  Genitourinary: Negative.  Negative for hematuria.  Musculoskeletal: Negative.   Neurological: Negative.  Negative for weakness.  Endo/Heme/Allergies: Does not bruise/bleed easily.  Psychiatric/Behavioral: Negative.  The patient is not nervous/anxious.     As per HPI. Otherwise, a complete review of systems is negatve.  PAST MEDICAL HISTORY: Past Medical History:  Diagnosis Date  . Cellulitis    left leg    PAST SURGICAL HISTORY: Past Surgical History:  Procedure Laterality Date  . TONSILLECTOMY      FAMILY HISTORY: Family History  Problem Relation Age of Onset  . Hypertension Mother   . Diabetes Mother        ADVANCED DIRECTIVES:    HEALTH MAINTENANCE: Social History  Substance Use Topics  . Smoking status: Never Smoker  .  Smokeless tobacco: Never Used  . Alcohol use No     Colonoscopy:  PAP:  Bone density:  Lipid panel:  Allergies  Allergen Reactions  . Augmentin [Amoxicillin-Pot Clavulanate] Hives    Current Outpatient Prescriptions  Medication Sig Dispense Refill  . furosemide (LASIX) 20 MG tablet Take 1 tablet (20 mg total) by mouth daily. 10 tablet 0  . predniSONE (DELTASONE) 50 MG tablet Take 2 tablets (100 mg total) by mouth daily with breakfast. 14 tablet 0   No current facility-administered medications for this visit.     OBJECTIVE: Vitals:   10/07/15 1547  BP: (!) 156/97  Pulse: 79  Resp: 18  Temp: 98.6 F (37 C)     Body mass index is 59 kg/m.    ECOG FS:0 - Asymptomatic  General: Well-developed, well-nourished, no acute distress. Eyes: Pink conjunctiva, anicteric sclera. Lungs: Clear to auscultation bilaterally. Heart: Regular rate and rhythm. No rubs, murmurs, or gallops. Abdomen: Soft, nontender, nondistended. No organomegaly noted, normoactive bowel sounds. Musculoskeletal: No edema, cyanosis, or clubbing. Neuro: Alert, answering all questions appropriately. Cranial nerves grossly intact. Skin: No rashes or petechiae noted. Psych: Normal affect.   LAB RESULTS:  Lab Results  Component Value Date   NA 138 06/30/2015   K 3.7 06/30/2015   CL 107 06/30/2015   CO2 25 06/30/2015   GLUCOSE 108 (H) 06/30/2015   BUN 18 06/30/2015   CREATININE 0.94 06/30/2015   CALCIUM 8.8 (L) 06/30/2015   PROT 7.8 06/30/2015   ALBUMIN 4.2 06/30/2015   AST 20 06/30/2015   ALT 26 06/30/2015   ALKPHOS 74 06/30/2015   BILITOT 0.5 06/30/2015   GFRNONAA >  60 06/30/2015   GFRAA >60 06/30/2015    Lab Results  Component Value Date   WBC 6.1 10/07/2015   NEUTROABS 4.3 10/07/2015   HGB 12.7 (L) 10/07/2015   HCT 39.2 (L) 10/07/2015   MCV 76.9 (L) 10/07/2015   PLT 36 (L) 10/07/2015   Lab Results  Component Value Date   IRON 50 09/09/2015   TIBC 320 09/09/2015   IRONPCTSAT 16 (L)  09/09/2015   Lab Results  Component Value Date   FERRITIN 75 09/09/2015     STUDIES: US Abdomen Complete  Result Date: 09/16/2015 CLINICAL DATA:  Thrombocytopenia. EXAM: ABDOMEN ULTRASOUND COMPLETE COMPARISON:  None. FINDINGS: Gallbladder: No gallstones or wall thickening visualized. No sonographic Murphy sign noted by sonographer. Common bile duct: Diameter: 4 mm. Liver: The liver demonstrates coarse echotexture and increased echogenicity, likely reflecting diffuse steatosis. No overt cirrhotic contour abnormalities or focal lesions are identified. There is no evidence of intrahepatic biliary ductal dilatation. The portal vein is open. IVC: No abnormality visualized. Pancreas: Visualized portion unremarkable. Spleen: Size and appearance within normal limits. Right Kidney: Length: 11.5 cm. Echogenicity within normal limits. No mass or hydronephrosis visualized. Left Kidney: Length: 11.5 cm. Echogenicity within normal limits. No mass or hydronephrosis visualized. Abdominal aorta: No aneurysm visualized. Other findings: None. IMPRESSION: 1. No evidence of splenomegaly. 2. Evidence of diffuse hepatic steatosis. Electronically Signed   By: Aletta Edouard M.D.   On: 09/16/2015 09:00    ASSESSMENT: Thrombocytopenia.  PLAN:     1. Thrombocytopenia: Patient's platelet count remains significantly decreased at 36 today. 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 All of patient's other blood work is either negative or within normal limits. Splenic ultrasound did not reveal any evidence of splenomegaly. Will give patient prednisone burst of 100 mg daily for 7 days and return to clinic in 1 week to assess his platelet count. If improved, prednisone can be discontinued and we will monitor for durability. If not improved, will consider treatment with Rituxan. Can also consider a bone marrow biopsy in the future, but given patient's body habitus this procedure would be difficult. 2.  Hypertension: Patient's blood pressure remains elevated today. Monitor and defer treatment primary care physician.  Patient expressed understanding and was in agreement with this plan. He also understands that He can call clinic at any time with any questions, concerns, or complaints.    Lloyd Huger, MD   10/11/2015 4:55 PM

## 2015-10-07 NOTE — Progress Notes (Signed)
States is feeling well. Offers no complaints. 

## 2015-10-15 ENCOUNTER — Other Ambulatory Visit: Payer: Self-pay

## 2015-10-15 ENCOUNTER — Inpatient Hospital Stay: Payer: Self-pay | Attending: Oncology

## 2015-10-15 DIAGNOSIS — Z79899 Other long term (current) drug therapy: Secondary | ICD-10-CM | POA: Insufficient documentation

## 2015-10-15 DIAGNOSIS — D696 Thrombocytopenia, unspecified: Secondary | ICD-10-CM

## 2015-10-15 DIAGNOSIS — I129 Hypertensive chronic kidney disease with stage 1 through stage 4 chronic kidney disease, or unspecified chronic kidney disease: Secondary | ICD-10-CM | POA: Insufficient documentation

## 2015-10-15 LAB — CBC WITH DIFFERENTIAL/PLATELET
Basophils Absolute: 0 10*3/uL (ref 0–0.1)
Basophils Relative: 0 %
EOS ABS: 0 10*3/uL (ref 0–0.7)
EOS PCT: 0 %
HCT: 42 % (ref 40.0–52.0)
Hemoglobin: 13.9 g/dL (ref 13.0–18.0)
LYMPHS ABS: 0.9 10*3/uL — AB (ref 1.0–3.6)
LYMPHS PCT: 9 %
MCH: 25.3 pg — AB (ref 26.0–34.0)
MCHC: 33 g/dL (ref 32.0–36.0)
MCV: 76.6 fL — AB (ref 80.0–100.0)
MONO ABS: 0.2 10*3/uL (ref 0.2–1.0)
Monocytes Relative: 2 %
Neutro Abs: 8.3 10*3/uL — ABNORMAL HIGH (ref 1.4–6.5)
Neutrophils Relative %: 89 %
PLATELETS: 129 10*3/uL — AB (ref 150–440)
RBC: 5.48 MIL/uL (ref 4.40–5.90)
RDW: 15.8 % — AB (ref 11.5–14.5)
WBC: 9.4 10*3/uL (ref 3.8–10.6)

## 2016-03-19 ENCOUNTER — Emergency Department
Admission: EM | Admit: 2016-03-19 | Discharge: 2016-03-19 | Disposition: A | Discharge status: Home / Self Care | Payer: Self-pay | Attending: Emergency Medicine | Admitting: Emergency Medicine

## 2016-03-19 DIAGNOSIS — M25512 Pain in left shoulder: Secondary | ICD-10-CM | POA: Insufficient documentation

## 2016-03-19 MED ORDER — IBUPROFEN 600 MG PO TABS: 600.0000 mg | ORAL_TABLET | Freq: Three times a day (TID) | ORAL | 0 refills | Status: DC | PRN

## 2016-03-19 MED ORDER — IBUPROFEN 600 MG PO TABS
600.0000 mg | ORAL_TABLET | Freq: Once | ORAL | Status: AC
  Administered 2016-03-19: 600 mg via ORAL
  Filled 2016-03-19: qty 1

## 2016-03-19 NOTE — ED Triage Notes (Signed)
Pt reports left shoulder pain for three days. Pt states has recently started going to the gym.

## 2016-03-19 NOTE — ED Provider Notes (Signed)
[facility] Emergency Department Provider Note  ____________________________________________   First MD Initiated Contact with Patient 03/19/16 [phone]     (approximate)  I have reviewed the triage vital signs and the nursing notes.   HISTORY  Chief Complaint Shoulder Pain    HPI Samuel Rojas is a 26 y.o. male is here complaining of left shoulder pain for the last 3 days. Patient states that he began going to the gym prior to his shoulder pain. He states that there is been no actual injury and pain has gradually increased. When asked what type of weights patient is resting he states that he has no idea. Patient states he has not taken any over-the-counter medication for his pain during this time. Mother acknowledges that he has not taken any medication because "he is stubborn".  Patient denies any abdominal pain or previous ulcer disease. He rates his pain as a 6/10.   Past Medical History:  Diagnosis Date  . Cellulitis    left leg    Patient Active Problem List   Diagnosis Date Noted  . Thrombocytopenia (HCC) 09/09/2015  . Cellulitis 01/16/2014    Past Surgical History:  Procedure Laterality Date  . TONSILLECTOMY      Prior to Admission medications   Medication Sig Start Date End Date Taking? Authorizing Provider  ibuprofen (ADVIL,MOTRIN) 600 MG tablet Take 1 tablet (600 mg total) by mouth every 8 (eight) hours as needed. 03/19/16   Tommi Rumps, PA-C    Allergies Augmentin [amoxicillin-pot clavulanate]  Family History  Problem Relation Age of Onset  . Hypertension Mother   . Diabetes Mother     Social History Social History  Substance Use Topics  . Smoking status: Never Smoker  . Smokeless tobacco: Never Used  . Alcohol use No    Review of Systems Constitutional: No fever/chills Cardiovascular: Denies chest pain. Respiratory: Denies shortness of breath. Gastrointestinal:   No nausea, no vomiting.  Musculoskeletal:  Positive for left shoulder pain. Skin: Negative for rash. Neurological: Negative for headaches, focal weakness or numbness.  10-point ROS otherwise negative.  ____________________________________________   PHYSICAL EXAM:  VITAL SIGNS: ED Triage Vitals [03/19/16 0912]  Enc Vitals Group     BP 116/90     Pulse Rate (!) 50     Resp 18     Temp 98.3 F (36.8 C)     Temp Source Oral     SpO2 100 %     Weight (!) 360 lb (163.3 kg)     Height 5\' 6"  (1.676 m)     Head Circumference      Peak Flow      Pain Score 8     Pain Loc      Pain Edu?      Excl. in GC?     Constitutional: Alert and oriented. Well appearing and in no acute distress. Eyes: Conjunctivae are normal. PERRL. EOMI. Head: Atraumatic. Nose: No congestion/rhinnorhea. Neck: No stridor.   Cardiovascular: Normal rate, regular rhythm. Grossly normal heart sounds.  Good peripheral circulation. Respiratory: Normal respiratory effort.  No retractions. Lungs CTAB. Gastrointestinal: Soft and nontender. No distention.  Musculoskeletal: Moves left shoulder slowly secondary to discomfort. There is no gross deformity and no soft tissue swelling. There is no crepitus with range of motion. Patient is able to move in all 4 planes without restriction. Pulses present distally and good muscle strength is noted. Neurologic:  Normal speech and language. No gross focal neurologic deficits are  appreciated. No gait instability. Skin:  Skin is warm, dry and intact. No rash noted. There is no ecchymosis, abrasions or erythema. Psychiatric: Mood and affect are normal. Speech and behavior are normal.  ____________________________________________   LABS (all labs ordered are listed, but only abnormal results are displayed)  Labs Reviewed - No data to display  PROCEDURES  Procedure(s) performed: None  Procedures  Critical Care performed: No  ____________________________________________   INITIAL IMPRESSION / ASSESSMENT AND PLAN  / ED COURSE  Pertinent labs & imaging results that were available during my care of the patient were reviewed by me and considered in my medical decision making (see chart for details).  Patient was given ibuprofen while in the emergency room. He states that prior to discharge that ibuprofen was working. He was given a prescription for the same to take 3 times a day. He is also to concentrate more on the lower body exercises for the next week. At that time he may resume upper body exercises but has to wait that he has been lifting. [facility]'ll [facility] if any continued problems. He may also use ice or heat to the shoulder as needed for discomfort.      ____________________________________________   FINAL CLINICAL IMPRESSION(S) / ED DIAGNOSES  Final diagnoses:  Acute pain of left shoulder      NEW MEDICATIONS STARTED DURING THIS VISIT:  Discharge Medication List as of 03/19/2016 10:07 AM    START taking these medications   Details  ibuprofen (ADVIL,MOTRIN) 600 MG tablet Take 1 tablet (600 mg total) by mouth every 8 (eight) hours as needed., Starting Thu 03/19/2016, Print         Note:  This document was prepared using Dragon voice recognition software and may include unintentional dictation errors.    Tommi Rumpshonda L Lorae Roig, PA-C 03/19/16 1158    Jeanmarie PlantJames A McShane, MD 03/19/16 1435

## 2016-03-19 NOTE — Discharge Instructions (Signed)
Follow-up with your primary care doctor if any continued problems with your shoulder in one week. Begin lower body exercises for the next week. Then begin upper body exercises at half the weight that you were using. Ibuprofen 600 mg 3 times a day with food. Use ice packs for the next 2 days.

## 2016-11-12 ENCOUNTER — Encounter: Payer: Self-pay | Admitting: Emergency Medicine

## 2016-11-12 ENCOUNTER — Emergency Department
Admission: EM | Admit: 2016-11-12 | Discharge: 2016-11-12 | Disposition: A | Discharge status: Home / Self Care | Payer: Self-pay | Attending: Emergency Medicine | Admitting: Emergency Medicine

## 2016-11-12 DIAGNOSIS — R040 Epistaxis: Secondary | ICD-10-CM | POA: Insufficient documentation

## 2016-11-12 DIAGNOSIS — Z5321 Procedure and treatment not carried out due to patient leaving prior to being seen by health care provider: Secondary | ICD-10-CM | POA: Insufficient documentation

## 2016-11-12 MED ORDER — OXYMETAZOLINE HCL 0.05 % NA SOLN
1.0000 | Freq: Once | NASAL | Status: AC
  Administered 2016-11-12: 1 via NASAL
  Filled 2016-11-12: qty 15

## 2016-11-12 NOTE — ED Notes (Signed)
Pts mother came to desk and asked when son was to be seen, informed pts mother that we are working on bediing pts and about acuity, pts mother states "Discharge him, this is a sorry hospital I am taking him somewhere else", pts mother stormed out and took pt with her

## 2016-11-12 NOTE — ED Triage Notes (Signed)
Patient presents to ED via POV from home with epistaxis that began at 0945 this morning. Patient presents with active, continually bleeding noted to left nare. Blood noted down patients shirt. Patient is spitting out clots. Patient states he was messing with his nose, "I thought it was a booger but I think I popped something". Nasal clamp applied. Bleeding remains but has slowed greatly.

## 2016-11-13 DIAGNOSIS — R040 Epistaxis: Secondary | ICD-10-CM | POA: Insufficient documentation

## 2016-11-13 DIAGNOSIS — I890 Lymphedema, not elsewhere classified: Secondary | ICD-10-CM | POA: Insufficient documentation

## 2016-11-13 DIAGNOSIS — L83 Acanthosis nigricans: Secondary | ICD-10-CM | POA: Insufficient documentation

## 2017-04-12 ENCOUNTER — Emergency Department
Admission: EM | Admit: 2017-04-12 | Discharge: 2017-04-12 | Disposition: A | Discharge status: Home / Self Care | Payer: Self-pay | Attending: Emergency Medicine | Admitting: Emergency Medicine

## 2017-04-12 ENCOUNTER — Encounter: Payer: Self-pay | Admitting: Emergency Medicine

## 2017-04-12 DIAGNOSIS — H1033 Unspecified acute conjunctivitis, bilateral: Secondary | ICD-10-CM

## 2017-04-12 DIAGNOSIS — H1089 Other conjunctivitis: Secondary | ICD-10-CM | POA: Insufficient documentation

## 2017-04-12 MED ORDER — TOBRAMYCIN 0.3 % OP SOLN: 2.0000 [drp] | OPHTHALMIC | 0 refills | Status: DC

## 2017-04-12 NOTE — ED Provider Notes (Signed)
[facility] Emergency Department Provider Note  ____________________________________________   First MD Initiated Contact with Patient 04/12/17 1006     (approximate)  I have reviewed the triage vital signs and the nursing notes.   HISTORY  Chief Complaint Conjunctivitis    HPI Samuel Rojas is a 27 y.o. male presents emergency department complaining of questionable pinkeye.  States his eyes were irritated yesterday he woke up this morning with them completely matted shut.  Had apply a warm compress to open them.  He denies any fever or chills.  He denies any cold symptoms.  Past Medical History:  Diagnosis Date  . Cellulitis    left leg    Patient Active Problem List   Diagnosis Date Noted  . Thrombocytopenia (HCC) 09/09/2015  . Cellulitis 01/16/2014    Past Surgical History:  Procedure Laterality Date  . TONSILLECTOMY      Prior to Admission medications   Medication Sig Start Date End Date Taking? Authorizing Provider  tobramycin (TOBREX) 0.3 % ophthalmic solution Place 2 drops into both eyes every 4 (four) hours. 04/12/17   Sherrie Mustache Roselyn Bering, PA-C    Allergies Augmentin [amoxicillin-pot clavulanate]  Family History  Problem Relation Age of Onset  . Hypertension Mother   . Diabetes Mother     Social History Social History   Tobacco Use  . Smoking status: Never Smoker  . Smokeless tobacco: Never Used  Substance Use Topics  . Alcohol use: No  . Drug use: No    Review of Systems  Constitutional: No fever/chills Eyes: No visual changes.  Positive for drainage and matting of both eyes ENT: No sore throat. Respiratory: Denies cough Genitourinary: Negative for dysuria. Musculoskeletal: Negative for back pain. Skin: Negative for rash.    ____________________________________________   PHYSICAL EXAM:  VITAL SIGNS: ED Triage Vitals  Enc Vitals Group     BP 04/12/17 0912 (!) 133/118     Pulse Rate 04/12/17 0912 69      Resp 04/12/17 0912 20     Temp 04/12/17 0912 98.1 F (36.7 C)     Temp Source 04/12/17 0912 Oral     SpO2 04/12/17 0912 100 %     Weight 04/12/17 0913 (!) 360 lb (163.3 kg)     Height 04/12/17 0913 5\' 6"  (1.676 m)     Head Circumference --      Peak Flow --      Pain Score 04/12/17 0913 0     Pain Loc --      Pain Edu? --      Excl. in GC? --     Constitutional: Alert and oriented. Well appearing and in no acute distress. Eyes: Conjunctivae are injected.  There is dried exudate on the lashes, extraocular motions are intact, PERRL Head: Atraumatic. Nose: No congestion/rhinnorhea. Mouth/Throat: Mucous membranes are moist.   Cardiovascular: Normal rate, regular rhythm.  Heart rate is normal Respiratory: Normal respiratory effort.  No retractions the lungs are clear to auscultation GU: deferred Musculoskeletal: FROM all extremities, warm and well perfused Neurologic:  Normal speech and language.  Skin:  Skin is warm, dry and intact. No rash noted. Psychiatric: Mood and affect are normal. Speech and behavior are normal.  ____________________________________________   LABS (all labs ordered are listed, but only abnormal results are displayed)  Labs Reviewed - No data to display ____________________________________________   ____________________________________________  RADIOLOGY    ____________________________________________   PROCEDURES  Procedure(s) performed: No  Procedures  ____________________________________________   INITIAL IMPRESSION / ASSESSMENT AND PLAN / ED COURSE  Pertinent labs & imaging results that were available during my care of the patient were reviewed by me and considered in my medical decision making (see chart for details).  Patient is 27 year old male presents emergency department complaining of pinkeye.  On physical exam both eyes are injected and there is dry exudate on the lashes  Patient's diagnosis is acute  conjunctivitis.  He was given a prescription for tobramycin ophthalmic drops.  He was informed that he is contagious until he has had at least 24 hours of drops.  He was given a work note due to this condition.  He states he understands.  He will use medication as prescribed.  Follow his regular doctor or return to emergency department if worsening.  He was discharged in stable condition     As part of my medical decision making, I reviewed the following data within the electronic MEDICAL RECORD NUMBER Nursing notes reviewed and incorporated, Notes from prior ED visits and [place] Controlled Substance Database  ____________________________________________   FINAL CLINICAL IMPRESSION(S) / ED DIAGNOSES  Final diagnoses:  Acute bacterial conjunctivitis of both eyes      NEW MEDICATIONS STARTED DURING THIS VISIT:  Discharge Medication List as of 04/12/2017 10:25 AM    START taking these medications   Details  tobramycin (TOBREX) 0.3 % ophthalmic solution Place 2 drops into both eyes every 4 (four) hours., Starting Mon 04/12/2017, Print         Note:  This document was prepared using Dragon voice recognition software and may include unintentional dictation errors.    Faythe GheeFisher, Susan W, PA-C 04/12/17 1704    Don PerkingVeronese, [place], MD 04/13/17 [phone]

## 2017-04-12 NOTE — ED Triage Notes (Signed)
Pt reports has had a cold for the past few days and yesterday both eyes started itching. Pt reports this am woke up and both eyes were sealed shut with drainage. Pt denies itching in his eyes today.

## 2017-04-12 NOTE — Discharge Instructions (Signed)
Use the medication as prescribed.  Apply warm compress to your eyes as needed.  Follow-up with your regular doctor if not better in 3 days.  You are contagious for the next 24 hours.

## 2017-04-12 NOTE — ED Notes (Signed)
See triage note.  Presents with bilateral eye irritation  Woke up this am with eyes matted shut

## 2017-07-01 ENCOUNTER — Other Ambulatory Visit: Payer: Self-pay

## 2017-07-01 ENCOUNTER — Emergency Department
Admission: EM | Admit: 2017-07-01 | Discharge: 2017-07-01 | Disposition: A | Discharge status: Home / Self Care | Payer: Self-pay | Attending: Emergency Medicine | Admitting: Emergency Medicine

## 2017-07-01 DIAGNOSIS — R040 Epistaxis: Secondary | ICD-10-CM | POA: Insufficient documentation

## 2017-07-01 DIAGNOSIS — Z862 Personal history of diseases of the blood and blood-forming organs and certain disorders involving the immune mechanism: Secondary | ICD-10-CM | POA: Insufficient documentation

## 2017-07-01 LAB — COMPREHENSIVE METABOLIC PANEL
ALK PHOS: 65 U/L (ref 38–126)
ALT: 18 U/L (ref 17–63)
AST: 21 U/L (ref 15–41)
Albumin: 4 g/dL (ref 3.5–5.0)
Anion gap: 4 — ABNORMAL LOW (ref 5–15)
BILIRUBIN TOTAL: 0.8 mg/dL (ref 0.3–1.2)
BUN: 15 mg/dL (ref 6–20)
CALCIUM: 8.8 mg/dL — AB (ref 8.9–10.3)
CO2: 27 mmol/L (ref 22–32)
CREATININE: 0.73 mg/dL (ref 0.61–1.24)
Chloride: 107 mmol/L (ref 101–111)
GFR calc Af Amer: >60 mL/min (ref >60)
Glucose, Bld: 104 mg/dL — ABNORMAL HIGH (ref 65–99)
POTASSIUM: 3.9 mmol/L (ref 3.5–5.1)
Sodium: 138 mmol/L (ref 135–145)
TOTAL PROTEIN: 8.1 g/dL (ref 6.5–8.1)

## 2017-07-01 LAB — CBC
HEMATOCRIT: 34.7 % — AB (ref 40.0–52.0)
Hemoglobin: 11.5 g/dL — ABNORMAL LOW (ref 13.0–18.0)
MCH: 25.5 pg — ABNORMAL LOW (ref 26.0–34.0)
MCHC: 33.1 g/dL (ref 32.0–36.0)
MCV: 77 fL — AB (ref 80.0–100.0)
Platelets: 14 10*3/uL — CL (ref 150–440)
RBC: 4.51 MIL/uL (ref 4.40–5.90)
RDW: 17.2 % — AB (ref 11.5–14.5)
WBC: 4.2 10*3/uL (ref 3.8–10.6)

## 2017-07-01 MED ORDER — OXYMETAZOLINE HCL 0.05 % NA SOLN
2.0000 | Freq: Once | NASAL | Status: AC
  Administered 2017-07-01: 2 via NASAL
  Filled 2017-07-01: qty 15

## 2017-07-01 MED ORDER — PREDNISONE 50 MG PO TABS: ORAL_TABLET | ORAL | 0 refills | Status: DC

## 2017-07-01 MED ORDER — PREDNISONE 20 MG PO TABS
100.0000 mg | ORAL_TABLET | Freq: Once | ORAL | Status: AC
  Administered 2017-07-01: 100 mg via ORAL
  Filled 2017-07-01: qty 5

## 2017-07-01 NOTE — Discharge Instructions (Addendum)
Return to the emergency department immediately for any repeat nosebleed.  Take prednisone for 7 days.  Follow-up with Dr. Orlie Dakin in 2 weeks.

## 2017-07-01 NOTE — ED Triage Notes (Signed)
Pt c/o left nare bleeding since this morning, denies injury and does not take any blood thinners, states he has been sick recently with a URI

## 2017-07-01 NOTE — ED Provider Notes (Signed)
[facility] Emergency Department Provider Note  ____________________________________________  Time seen: Approximately 10:27 AM  I have reviewed the triage vital signs and the nursing notes.   HISTORY  Chief Complaint Epistaxis    HPI Samuel Rojas is a 27 y.o. male that presents to the emergency department for evaluation of nosebleed this morning.  Patient states that no started bleeding around 7 AM.  He got the bleeding to stop and went to work.  After he got to work, his nose started bleeding again for about an hour. He was recently sick with a URI. He has ITP.  No dizziness, weakness.   Past Medical History:  Diagnosis Date  . Cellulitis    left leg    Patient Active Problem List   Diagnosis Date Noted  . Thrombocytopenia (HCC) 09/09/2015  . Cellulitis 01/16/2014    Past Surgical History:  Procedure Laterality Date  . TONSILLECTOMY      Prior to Admission medications   Medication Sig Start Date End Date Taking? Authorizing Provider  predniSONE (DELTASONE) 50 MG tablet Take  (2 tabs) per day for 7 days. 07/01/17   Enid Derry, PA-C  tobramycin (TOBREX) 0.3 % ophthalmic solution Place 2 drops into both eyes every 4 (four) hours. 04/12/17   Sherrie Mustache Roselyn Bering, PA-C    Allergies Augmentin [amoxicillin-pot clavulanate]  Family History  Problem Relation Age of Onset  . Hypertension Mother   . Diabetes Mother     Social History Social History   Tobacco Use  . Smoking status: Never Smoker  . Smokeless tobacco: Never Used  Substance Use Topics  . Alcohol use: No  . Drug use: No     Review of Systems  Cardiovascular: No chest pain. Respiratory: No SOB. Gastrointestinal: No abdominal pain.  No nausea, no vomiting.  Musculoskeletal: Negative for musculoskeletal pain. Skin: Negative for rash, ecchymosis.  ____________________________________________   PHYSICAL EXAM:  VITAL SIGNS: ED Triage Vitals [07/01/17 0950]   Enc Vitals Group     BP (!) 147/101     Pulse Rate 100     Resp 16     Temp 98.5 F (36.9 C)     Temp Source Oral     SpO2 97 %     Weight (!) 360 lb (163.3 kg)     Height  (1.676 m)     Head Circumference      Peak Flow      Pain Score 0     Pain Loc      Pain Edu?      Excl. in GC?      Constitutional: Alert and oriented. Well appearing and in no acute distress. Eyes: Conjunctivae are normal. PERRL. EOMI. Head: Atraumatic. ENT:      Ears:      Nose: No congestion/rhinnorhea. Blood in left nares. Bleeding from anterior nasal canal.       Mouth/Throat: Mucous membranes are moist.  Neck: No stridor. Cardiovascular: Normal rate, regular rhythm.  Good peripheral circulation. Respiratory: Normal respiratory effort without tachypnea or retractions. Lungs CTAB. Good air entry to the bases with no decreased or absent breath sounds. Musculoskeletal: Full range of motion to all extremities. No gross deformities appreciated. Neurologic:  Normal speech and language. No gross focal neurologic deficits are appreciated.  Skin:  Skin is warm, dry and intact. No rash noted. Psychiatric: Mood and affect are normal. Speech and behavior are normal. Patient exhibits appropriate insight and judgement.   ____________________________________________   LABS (  all labs ordered are listed, but only abnormal results are displayed)  Labs Reviewed  CBC - Abnormal; Notable for the following components:      Result Value   Hemoglobin 11.5 (*)    HCT 34.7 (*)    MCV 77.0 (*)    MCH 25.5 (*)    RDW 17.2 (*)    Platelets 14 (*)    All other components within normal limits  COMPREHENSIVE METABOLIC PANEL - Abnormal; Notable for the following components:   Glucose, Bld 104 (*)    Calcium 8.8 (*)    Anion gap 4 (*)    All other components within normal limits   ____________________________________________  EKG   ____________________________________________  RADIOLOGY  No results  found.  ____________________________________________    PROCEDURES  Procedure(s) performed:    Procedures    Medications  oxymetazoline (AFRIN) 0.05 % nasal spray 2 spray (2 sprays Left Nare Given 07/01/17 1041)  predniSONE (DELTASONE) tablet 100 mg (100 mg Oral Given 07/01/17 1431)     ____________________________________________   INITIAL IMPRESSION / ASSESSMENT AND PLAN / ED COURSE  Pertinent labs & imaging results that were available during my care of the patient were reviewed by me and considered in my medical decision making (see chart for details).  Review of the [place] CSRS was performed in accordance of the [facility] prior to dispensing any controlled drugs.   Patient presented to emergency department for evaluation of epistaxis.  Vital signs and exam are reassuring.  CBC remarkable for platelet count of 15, hemoglobin 11.5 and hematocrit 34.7. Patient has seen hematology in the past for ITP.  Dr. Orlie Dakin was consulted and recommended that patient be placed on  of prednisone for 7 days and to follow-up with him in the office.  Appointment was made while patient was in ED.  Bleeding stopped with pressure and Afrin.  Patient did not have any additional brief bleeding in the 3 hours patient was waiting in the emergency department.  Patient will be discharged home with prescriptions for prednisone. Patient is to follow up with hematology as directed. Patient is given ED precautions to return to the ED for any worsening or new symptoms.     ____________________________________________  FINAL CLINICAL IMPRESSION(S) / ED DIAGNOSES  Final diagnoses:  Left-sided epistaxis  History of ITP      NEW MEDICATIONS STARTED DURING THIS VISIT:  ED Discharge Orders        Ordered    predniSONE (DELTASONE) 50 MG tablet     07/01/17 1420          This chart was dictated using voice recognition software/Dragon. Despite best efforts to proofread, errors can occur which can  change the meaning. Any change was purely unintentional.    Enid Derry, PA-C 07/01/17 1513    Don Perking, [place], MD 07/19/17 1017

## 2017-07-17 NOTE — Progress Notes (Signed)
[name]  Telephone:(336) [phone] [phone]) [phone]  ID: Samuel Rojas OB: 06/14/1990  MR#: [id number]  TKZ#:[id number]  Patient Care Team: [facility] [place] as PCP - General (General Practice)  CHIEF COMPLAINT: Thrombocytopenia.  INTERVAL HISTORY: Patient was last evaluated in August 2017.  He was referred back from the emergency room after being found to have a platelet count of 14.  His most recent platelet count prior to that was in September 2015 was reported as 129.  He currently feels well and is asymptomatic.  He denies any easy bleeding or bruising. He has no neurologic complaints. He denies any recent fevers or illnesses. He has a good appetite and denies weight loss. He has no chest pain or shortness of breath. He denies any nausea, vomiting, constipation, or diarrhea. He has no urinary complaints.  Patient offers no ossific complaints today.  REVIEW OF SYSTEMS:   Review of Systems  Constitutional: Negative.  Negative for fever, malaise/fatigue and weight loss.  HENT: Negative.  Negative for nosebleeds.   Respiratory: Negative.  Negative for cough, hemoptysis and shortness of breath.   Cardiovascular: Negative.  Negative for chest pain and leg swelling.  Gastrointestinal: Negative.  Negative for abdominal pain, blood in stool and melena.  Genitourinary: Negative.  Negative for hematuria.  Musculoskeletal: Negative.   Skin: Negative.  Negative for rash.  Neurological: Negative.  Negative for sensory change, focal weakness and weakness.  Endo/Heme/Allergies: Does not bruise/bleed easily.  Psychiatric/Behavioral: Negative.  The patient is not nervous/anxious.     As per HPI. Otherwise, a complete review of systems is negatve.  PAST MEDICAL HISTORY: Past Medical History:  Diagnosis Date  . Cellulitis    left leg    PAST SURGICAL HISTORY: Past Surgical History:  Procedure Laterality Date  . TONSILLECTOMY      FAMILY  HISTORY: Family History  Problem Relation Age of Onset  . Hypertension Mother   . Diabetes Mother        ADVANCED DIRECTIVES:    HEALTH MAINTENANCE: Social History   Tobacco Use  . Smoking status: Never Smoker  . Smokeless tobacco: Never Used  Substance Use Topics  . Alcohol use: No  . Drug use: No     Colonoscopy:  PAP:  Bone density:  Lipid panel:  Allergies  Allergen Reactions  . Augmentin [Amoxicillin-Pot Clavulanate] Hives    No current outpatient medications on file.   No current facility-administered medications for this visit.     OBJECTIVE: Vitals:   07/20/17 1058 07/20/17 1101  BP:  (!) 165/94  Pulse:  90  Resp: 16   Temp:  98.5 F (36.9 C)     Body mass index is 59.91 kg/m.    ECOG FS:0 - Asymptomatic  General: Well-developed, well-nourished, no acute distress. Eyes: Pink conjunctiva, anicteric sclera. HEENT: Normocephalic, moist mucous membranes, clear oropharnyx. Lungs: Clear to auscultation bilaterally. Heart: Regular rate and rhythm. No rubs, murmurs, or gallops. Abdomen: Soft, nontender, nondistended. No organomegaly noted, normoactive bowel sounds. Musculoskeletal: No edema, cyanosis, or clubbing. Neuro: Alert, answering all questions appropriately. Cranial nerves grossly intact. Skin: No rashes or petechiae noted. Psych: Normal affect. Lymphatics: No cervical, calvicular, axillary or inguinal LAD.  LAB RESULTS:  Lab Results  Component Value Date   NA 138 07/01/2017   K 3.9 07/01/2017   CL 107 07/01/2017   CO2 27 07/01/2017   GLUCOSE 104 (H) 07/01/2017   BUN 15 07/01/2017   CREATININE 0.73 07/01/2017   CALCIUM 8.8 (  L) 07/01/2017   PROT 8.1 07/01/2017   ALBUMIN 4.0 07/01/2017   AST 21 07/01/2017   ALT 18 07/01/2017   ALKPHOS 65 07/01/2017   BILITOT 0.8 07/01/2017   GFRNONAA >60 07/01/2017   GFRAA >60 07/01/2017    Lab Results  Component Value Date   WBC 4.2 07/01/2017   NEUTROABS 8.3 (H) 10/15/2015   HGB 11.5 (L)  07/01/2017   HCT 34.7 (L) 07/01/2017   MCV 77.0 (L) 07/01/2017   PLT 14 (LL) 07/01/2017   Lab Results  Component Value Date   IRON 50 09/09/2015   TIBC 320 09/09/2015   IRONPCTSAT 16 (L) 09/09/2015   Lab Results  Component Value Date   FERRITIN 75 09/09/2015     STUDIES: No results found.  ASSESSMENT: Thrombocytopenia.  PLAN:     1. Thrombocytopenia: Patient's platelet count has significantly decreased to 14 approximately 3 weeks ago.  Multiple attempts at repeat blood work today were unsuccessful and patient has been instructed to go home, drink plenty of fluids and return to clinic Friday for repeat laboratory work.  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  Splenic ultrasound did not reveal any evidence of splenomegaly.  Bone marrow biopsy was not completed secondary to technical difficulties with patient's body habitus.  Previously, patient received a 7-day burst dose of prednisone 100 mg daily with improvement of his platelet count.  Unclear of the durability of this treatment since he was lost to follow-up.  Once patient has his repeat blood work on Friday, will determine treatment.  We will likely use prednisone first followed by Rituxan to obtain a more durable response.    Approximately 30 minutes was spent in discussion of which greater than 50% was consultation.   Patient expressed understanding and was in agreement with this plan. He also understands that He can call clinic at any time with any questions, concerns, or complaints.    Lloyd Huger, MD   07/23/2017 12:50 PM

## 2017-07-20 ENCOUNTER — Encounter: Payer: Self-pay | Admitting: Oncology

## 2017-07-20 ENCOUNTER — Other Ambulatory Visit: Payer: Self-pay

## 2017-07-20 ENCOUNTER — Inpatient Hospital Stay (HOSPITAL_BASED_OUTPATIENT_CLINIC_OR_DEPARTMENT_OTHER): Payer: BLUE CROSS/BLUE SHIELD | Admitting: Oncology

## 2017-07-20 ENCOUNTER — Inpatient Hospital Stay: Payer: BLUE CROSS/BLUE SHIELD | Attending: Oncology

## 2017-07-20 VITALS — BP 165/94 | HR 90 | Temp 98.5°F | Resp 16 | Ht 66.0 in | Wt 371.2 lb

## 2017-07-20 DIAGNOSIS — D696 Thrombocytopenia, unspecified: Secondary | ICD-10-CM

## 2017-07-20 NOTE — Progress Notes (Signed)
Patient here for follow up. He has been asymptomatic since last appt.

## 2017-07-23 ENCOUNTER — Other Ambulatory Visit: Payer: Self-pay | Admitting: *Deleted

## 2017-07-23 ENCOUNTER — Other Ambulatory Visit: Payer: Self-pay

## 2017-07-23 ENCOUNTER — Inpatient Hospital Stay: Payer: BLUE CROSS/BLUE SHIELD

## 2017-07-23 DIAGNOSIS — D696 Thrombocytopenia, unspecified: Secondary | ICD-10-CM

## 2017-07-23 LAB — CBC WITH DIFFERENTIAL/PLATELET
Basophils Absolute: 0.1 10*3/uL (ref 0–0.1)
Basophils Relative: 1 %
EOS PCT: 2 %
Eosinophils Absolute: 0.1 10*3/uL (ref 0–0.7)
HCT: 33.1 % — ABNORMAL LOW (ref 40.0–52.0)
Hemoglobin: 11 g/dL — ABNORMAL LOW (ref 13.0–18.0)
LYMPHS ABS: 1.5 10*3/uL (ref 1.0–3.6)
LYMPHS PCT: 29 %
MCH: 26.1 pg (ref 26.0–34.0)
MCHC: 33.3 g/dL (ref 32.0–36.0)
MCV: 78.5 fL — AB (ref 80.0–100.0)
MONOS PCT: 6 %
Monocytes Absolute: 0.3 10*3/uL (ref 0.2–1.0)
Neutro Abs: 3.3 10*3/uL (ref 1.4–6.5)
Neutrophils Relative %: 62 %
PLATELETS: 7 10*3/uL — AB (ref 150–400)
RBC: 4.22 MIL/uL — AB (ref 4.40–5.90)
RDW: 17.5 % — ABNORMAL HIGH (ref 11.5–14.5)
WBC: 5.3 10*3/uL (ref 3.8–10.6)

## 2017-07-23 MED ORDER — PREDNISONE 20 MG PO TABS: 100.0000 mg | ORAL_TABLET | Freq: Every day | ORAL | 0 refills | Status: DC

## 2017-07-28 ENCOUNTER — Other Ambulatory Visit: Payer: Self-pay | Admitting: *Deleted

## 2017-07-28 DIAGNOSIS — D696 Thrombocytopenia, unspecified: Secondary | ICD-10-CM

## 2017-07-28 NOTE — Progress Notes (Signed)
Cbc

## 2017-07-29 ENCOUNTER — Inpatient Hospital Stay (HOSPITAL_BASED_OUTPATIENT_CLINIC_OR_DEPARTMENT_OTHER): Payer: BLUE CROSS/BLUE SHIELD | Admitting: Oncology

## 2017-07-29 ENCOUNTER — Inpatient Hospital Stay: Payer: BLUE CROSS/BLUE SHIELD

## 2017-07-29 ENCOUNTER — Encounter: Payer: Self-pay | Admitting: Oncology

## 2017-07-29 VITALS — BP 144/83 | HR 91 | Temp 97.4°F | Resp 18 | Wt 366.2 lb

## 2017-07-29 DIAGNOSIS — D696 Thrombocytopenia, unspecified: Secondary | ICD-10-CM

## 2017-07-29 LAB — CBC WITH DIFFERENTIAL/PLATELET
BASOS ABS: 0 10*3/uL (ref 0–0.1)
BASOS PCT: 0 %
Eosinophils Absolute: 0 10*3/uL (ref 0–0.7)
Eosinophils Relative: 0 %
HEMATOCRIT: 34.8 % — AB (ref 40.0–52.0)
HEMOGLOBIN: 11.4 g/dL — AB (ref 13.0–18.0)
Lymphocytes Relative: 14 %
Lymphs Abs: 0.9 10*3/uL — ABNORMAL LOW (ref 1.0–3.6)
MCH: 25.9 pg — ABNORMAL LOW (ref 26.0–34.0)
MCHC: 32.7 g/dL (ref 32.0–36.0)
MCV: 79.2 fL — ABNORMAL LOW (ref 80.0–100.0)
MONOS PCT: 2 %
Monocytes Absolute: 0.1 10*3/uL — ABNORMAL LOW (ref 0.2–1.0)
NEUTROS ABS: 5.7 10*3/uL (ref 1.4–6.5)
NEUTROS PCT: 84 %
Platelets: 285 10*3/uL (ref 150–440)
RBC: 4.39 MIL/uL — AB (ref 4.40–5.90)
RDW: 18.1 % — ABNORMAL HIGH (ref 11.5–14.5)
WBC: 6.8 10*3/uL (ref 3.8–10.6)

## 2017-07-29 NOTE — Progress Notes (Signed)
Patient denies any concerns today.  

## 2017-08-02 NOTE — Progress Notes (Signed)
[place]  Telephone:(336) [phone] Fax:(336) [phone]  ID: ROSA GAMBALE OB: 04/19/1990  MR#: [id number]  MWU#:[id number]  Patient Care Team: [facility] as PCP - General (General Practice)  CHIEF COMPLAINT: Thrombocytopenia.  INTERVAL HISTORY: Patient returns to clinic today for repeat laboratory work and further evaluation.  He is taken 5 days of 100 mg prednisone with significant improvement of his platelet count.  He continues to feel well and remains asymptomatic.  He denies any easy bleeding or bruising. He has no neurologic complaints. He denies any recent fevers or illnesses. He has a good appetite and denies weight loss. He has no chest pain or shortness of breath. He denies any nausea, vomiting, constipation, or diarrhea. He has no urinary complaints.  Patient offers no specific complaints today.  REVIEW OF SYSTEMS:   Review of Systems  Constitutional: Negative.  Negative for fever, malaise/fatigue and weight loss.  HENT: Negative.  Negative for nosebleeds.   Respiratory: Negative.  Negative for cough, hemoptysis and shortness of breath.   Cardiovascular: Negative.  Negative for chest pain and leg swelling.  Gastrointestinal: Negative.  Negative for abdominal pain, blood in stool and melena.  Genitourinary: Negative.  Negative for hematuria.  Musculoskeletal: Negative.   Skin: Negative.  Negative for rash.  Neurological: Negative.  Negative for sensory change, focal weakness and weakness.  Endo/Heme/Allergies: Does not bruise/bleed easily.  Psychiatric/Behavioral: Negative.  The patient is not nervous/anxious.     As per HPI. Otherwise, a complete review of systems is negatve.  PAST MEDICAL HISTORY: Past Medical History:  Diagnosis Date  . Cellulitis    left leg    PAST SURGICAL HISTORY: Past Surgical History:  Procedure Laterality Date  . TONSILLECTOMY      FAMILY HISTORY: Family History  Problem Relation  Age of Onset  . Hypertension Mother   . Diabetes Mother        ADVANCED DIRECTIVES:    HEALTH MAINTENANCE: Social History   Tobacco Use  . Smoking status: Never Smoker  . Smokeless tobacco: Never Used  Substance Use Topics  . Alcohol use: No  . Drug use: No     Colonoscopy:  PAP:  Bone density:  Lipid panel:  Allergies  Allergen Reactions  . Augmentin [Amoxicillin-Pot Clavulanate] Hives    Current Outpatient Medications  Medication Sig Dispense Refill  . predniSONE (DELTASONE) 20 MG tablet Take 5 tablets (100 mg total) by mouth daily with breakfast. Take for 7 days 35 tablet 0   No current facility-administered medications for this visit.     OBJECTIVE: Vitals:   07/29/17 1443  BP: (!) 144/83  Pulse: 91  Resp: 18  Temp: (!) 97.4 F (36.3 C)     Body mass index is 59.11 kg/m.    ECOG FS:0 - Asymptomatic  General: Well-developed, well-nourished, no acute distress. Eyes: Pink conjunctiva, anicteric sclera. Lungs: Clear to auscultation bilaterally. Heart: Regular rate and rhythm. No rubs, murmurs, or gallops. Abdomen: Soft, nontender, nondistended. No organomegaly noted, normoactive bowel sounds. Musculoskeletal: No edema, cyanosis, or clubbing. Neuro: Alert, answering all questions appropriately. Cranial nerves grossly intact. Skin: No rashes or petechiae noted. Psych: Normal affect.  LAB RESULTS:  Lab Results  Component Value Date   NA 138 07/01/2017   K 3.9 07/01/2017   CL 107 07/01/2017   CO2 27 07/01/2017   GLUCOSE 104 (H) 07/01/2017   BUN 15 07/01/2017   CREATININE 0.73 07/01/2017   CALCIUM 8.8 (L) 07/01/2017   PROT 8.1  07/01/2017   ALBUMIN 4.0 07/01/2017   AST 21 07/01/2017   ALT 18 07/01/2017   ALKPHOS 65 07/01/2017   BILITOT 0.8 07/01/2017   GFRNONAA >60 07/01/2017   GFRAA >60 07/01/2017    Lab Results  Component Value Date   WBC 6.8 07/29/2017   NEUTROABS 5.7 07/29/2017   HGB 11.4 (L) 07/29/2017   HCT 34.8 (L) 07/29/2017    MCV 79.2 (L) 07/29/2017   PLT 285 07/29/2017   Lab Results  Component Value Date   IRON 50 09/09/2015   TIBC 320 09/09/2015   IRONPCTSAT 16 (L) 09/09/2015   Lab Results  Component Value Date   FERRITIN 75 09/09/2015     STUDIES: No results found.  ASSESSMENT: Thrombocytopenia.  PLAN:     1. Thrombocytopenia: Patient's platelet count has significantly improved and is now within normal limits.  He is taken 5 days of 100 mg of prednisone and has been instructed to discontinue treatment.  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  Splenic ultrasound did not reveal any evidence of splenomegaly.  Bone marrow biopsy was not completed secondary to technical difficulties with patient's body habitus.  Unclear if the durability of maintaining an adequate platelet count with just prednisone.  Patient will likely require weekly Rituxan x4 in the future.  Return to clinic in 1 month with repeat laboratory work and further evaluation.  I spent a total of 20 minutes face-to-face with the patient of which greater than 50% of the visit was spent in counseling and coordination of care as summarized above.   Patient expressed understanding and was in agreement with this plan. He also understands that He can call clinic at any time with any questions, concerns, or complaints.    Lloyd Huger, MD   08/02/2017 8:16 AM

## 2017-08-30 ENCOUNTER — Inpatient Hospital Stay: Payer: BLUE CROSS/BLUE SHIELD | Attending: Oncology

## 2017-08-30 ENCOUNTER — Encounter: Payer: Self-pay | Admitting: Oncology

## 2017-08-30 ENCOUNTER — Inpatient Hospital Stay (HOSPITAL_BASED_OUTPATIENT_CLINIC_OR_DEPARTMENT_OTHER): Payer: BLUE CROSS/BLUE SHIELD | Admitting: Oncology

## 2017-08-30 ENCOUNTER — Other Ambulatory Visit: Payer: Self-pay

## 2017-08-30 VITALS — BP 152/84 | HR 71 | Temp 97.8°F | Resp 16 | Ht 66.0 in | Wt 367.5 lb

## 2017-08-30 DIAGNOSIS — D696 Thrombocytopenia, unspecified: Secondary | ICD-10-CM | POA: Diagnosis present

## 2017-08-30 DIAGNOSIS — Z92241 Personal history of systemic steroid therapy: Secondary | ICD-10-CM | POA: Diagnosis not present

## 2017-08-30 DIAGNOSIS — Z7952 Long term (current) use of systemic steroids: Secondary | ICD-10-CM | POA: Insufficient documentation

## 2017-08-30 LAB — CBC WITH DIFFERENTIAL/PLATELET
BASOS PCT: 0 %
Basophils Absolute: 0 10*3/uL (ref 0–0.1)
Eosinophils Absolute: 0.1 10*3/uL (ref 0–0.7)
Eosinophils Relative: 1 %
HEMATOCRIT: 37.8 % — AB (ref 40.0–52.0)
HEMOGLOBIN: 12.3 g/dL — AB (ref 13.0–18.0)
Lymphocytes Relative: 25 %
Lymphs Abs: 1.3 10*3/uL (ref 1.0–3.6)
MCH: 25.8 pg — ABNORMAL LOW (ref 26.0–34.0)
MCHC: 32.6 g/dL (ref 32.0–36.0)
MCV: 79.2 fL — ABNORMAL LOW (ref 80.0–100.0)
MONOS PCT: 4 %
Monocytes Absolute: 0.2 10*3/uL (ref 0.2–1.0)
NEUTROS ABS: 3.8 10*3/uL (ref 1.4–6.5)
NEUTROS PCT: 70 %
Platelets: 24 10*3/uL — CL (ref 150–440)
RBC: 4.78 MIL/uL (ref 4.40–5.90)
RDW: 16.9 % — ABNORMAL HIGH (ref 11.5–14.5)
WBC: 5.4 10*3/uL (ref 3.8–10.6)

## 2017-08-30 MED ORDER — PREDNISONE 20 MG PO TABS: 100.0000 mg | ORAL_TABLET | Freq: Every day | ORAL | 0 refills | Status: DC

## 2017-08-30 NOTE — Progress Notes (Signed)
[place]  Telephone:(336) [phone] Fax:(336) [phone]  ID: Samuel Rojas OB: 1990-05-07  MR#: [id number]  AFB#:[id number]  Patient Care Team: [facility] as PCP - General (General Practice)  CHIEF COMPLAINT: Thrombocytopenia.  INTERVAL HISTORY: Patient returns to clinic today for repeat laboratory work and further evaluation.  He currently feels well and remains asymptomatic.  He denies any easy bleeding or bruising. He has no neurologic complaints. He denies any recent fevers or illnesses. He has a good appetite and denies weight loss. He has no chest pain or shortness of breath. He denies any nausea, vomiting, constipation, or diarrhea. He has no urinary complaints.  Patient feels at his baseline offers no specific complaints today.  REVIEW OF SYSTEMS:   Review of Systems  Constitutional: Negative.  Negative for fever, malaise/fatigue and weight loss.  HENT: Negative.  Negative for nosebleeds.   Respiratory: Negative.  Negative for cough, hemoptysis and shortness of breath.   Cardiovascular: Negative.  Negative for chest pain and leg swelling.  Gastrointestinal: Negative.  Negative for abdominal pain, blood in stool and melena.  Genitourinary: Negative.  Negative for hematuria.  Musculoskeletal: Negative.   Skin: Negative.  Negative for rash.  Neurological: Negative.  Negative for sensory change, focal weakness and weakness.  Endo/Heme/Allergies: Does not bruise/bleed easily.  Psychiatric/Behavioral: Negative.  The patient is not nervous/anxious.     As per HPI. Otherwise, a complete review of systems is negatve.  PAST MEDICAL HISTORY: Past Medical History:  Diagnosis Date  . Cellulitis    left leg    PAST SURGICAL HISTORY: Past Surgical History:  Procedure Laterality Date  . TONSILLECTOMY      FAMILY HISTORY: Family History  Problem Relation Age of Onset  . Hypertension Mother   . Diabetes Mother         ADVANCED DIRECTIVES:    HEALTH MAINTENANCE: Social History   Tobacco Use  . Smoking status: Never Smoker  . Smokeless tobacco: Never Used  Substance Use Topics  . Alcohol use: No  . Drug use: No     Colonoscopy:  PAP:  Bone density:  Lipid panel:  Allergies  Allergen Reactions  . Augmentin [Amoxicillin-Pot Clavulanate] Hives    Current Outpatient Medications  Medication Sig Dispense Refill  . predniSONE (DELTASONE) 20 MG tablet Take 5 tablets (100 mg total) by mouth daily with breakfast. Take for 7 days 35 tablet 0   No current facility-administered medications for this visit.     OBJECTIVE: Vitals:   08/30/17 1547 08/30/17 1550  BP:  (!) 152/84  Pulse:  71  Resp: 16   Temp:  97.8 F (36.6 C)     Body mass index is 59.32 kg/m.    ECOG FS:0 - Asymptomatic  General: Well-developed, well-nourished, no acute distress. Eyes: Pink conjunctiva, anicteric sclera. HEENT: Normocephalic, moist mucous membranes, clear oropharnyx. Lungs: Clear to auscultation bilaterally. Heart: Regular rate and rhythm. No rubs, murmurs, or gallops. Abdomen: Soft, nontender, nondistended. No organomegaly noted, normoactive bowel sounds. Musculoskeletal: No edema, cyanosis, or clubbing. Neuro: Alert, answering all questions appropriately. Cranial nerves grossly intact. Skin: No rashes or petechiae noted. Psych: Normal affect.  LAB RESULTS:  Lab Results  Component Value Date   NA 138 07/01/2017   K 3.9 07/01/2017   CL 107 07/01/2017   CO2 27 07/01/2017   GLUCOSE 104 (H) 07/01/2017   BUN 15 07/01/2017   CREATININE 0.73 07/01/2017   CALCIUM 8.8 (L) 07/01/2017   PROT 8.1 07/01/2017  ALBUMIN 4.0 07/01/2017   AST 21 07/01/2017   ALT 18 07/01/2017   ALKPHOS 65 07/01/2017   BILITOT 0.8 07/01/2017   GFRNONAA >60 07/01/2017   GFRAA >60 07/01/2017    Lab Results  Component Value Date   WBC 5.4 08/30/2017   NEUTROABS 3.8 08/30/2017   HGB 12.3 (L) 08/30/2017   HCT 37.8  (L) 08/30/2017   MCV 79.2 (L) 08/30/2017   PLT 24 (LL) 08/30/2017   Lab Results  Component Value Date   IRON 50 09/09/2015   TIBC 320 09/09/2015   IRONPCTSAT 16 (L) 09/09/2015   Lab Results  Component Value Date   FERRITIN 75 09/09/2015     STUDIES: No results found.  ASSESSMENT: Thrombocytopenia.  PLAN:     1. Thrombocytopenia: Despite taking 5 days of 100 mg prednisone with an excellent response to his platelets of greater than 200, this response was not durable and patient's platelet have now dropped to 24. 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  Splenic ultrasound did not reveal any evidence of splenomegaly.  Bone marrow biopsy was not completed secondary to technical difficulties with patient's body habitus.  Because treatment with prednisone was not durable, will pursue weekly Rituxan x4.  Patient will be given another burst dose of prednisone over the next week in order to have a port placed.  Patient will return to clinic in 2 weeks for further evaluation and consideration of cycle 1 of 4 of weekly Rituxan.    2.  Poor venous access: Proceed with port as above.  I spent a total of 30 minutes face-to-face with the patient of which greater than 50% of the visit was spent in counseling and coordination of care as detailed above.   Patient expressed understanding and was in agreement with this plan. He also understands that He can call clinic at any time with any questions, concerns, or complaints.    Lloyd Huger, MD   09/01/2017 10:58 PM

## 2017-08-30 NOTE — Progress Notes (Signed)
Patient here for follow up. He states he has been sleepier for the past few days.

## 2017-08-31 ENCOUNTER — Telehealth (INDEPENDENT_AMBULATORY_CARE_PROVIDER_SITE_OTHER): Payer: Self-pay

## 2017-08-31 LAB — HEPATITIS B SURFACE ANTIBODY, QUANTITATIVE: HEPATITIS B-POST: 3.5 m[IU]/mL — AB

## 2017-08-31 LAB — HEPATITIS B SURFACE ANTIGEN: Hepatitis B Surface Ag: NEGATIVE

## 2017-08-31 NOTE — Telephone Encounter (Signed)
Attempted to contact the patient and left a message for a return call. 

## 2017-09-02 NOTE — Telephone Encounter (Signed)
Attempted to contact patient to schedule a port placement and left a message for a return call.

## 2017-09-13 NOTE — Progress Notes (Signed)
[place]  Telephone:(336) [phone] Fax:(336) [phone]  ID: RIKU BUTTERY OB: Nov 04, 1990  MR#: [id number]  YKD#:[id number]  Patient Care Team: [facility], [employer] as PCP - General (General Practice)  CHIEF COMPLAINT: ITP.  INTERVAL HISTORY: Patient returns to clinic today for repeat laboratory work, further evaluation, and initiation of cycle 1 of 4 of weekly Rituxan.  He did not have his port placement over confusion of scheduling.  He currently feels well and is asymptomatic.  He denies any easy bleeding or bruising. He has no neurologic complaints. He denies any recent fevers or illnesses. He has a good appetite and denies weight loss. He has no chest pain or shortness of breath. He denies any nausea, vomiting, constipation, or diarrhea. He has no urinary complaints.  Patient offers no specific complaints today.  REVIEW OF SYSTEMS:   Review of Systems  Constitutional: Negative.  Negative for fever, malaise/fatigue and weight loss.  HENT: Negative.  Negative for nosebleeds.   Respiratory: Negative.  Negative for cough, hemoptysis and shortness of breath.   Cardiovascular: Negative.  Negative for chest pain and leg swelling.  Gastrointestinal: Negative.  Negative for abdominal pain, blood in stool and melena.  Genitourinary: Negative.  Negative for hematuria.  Musculoskeletal: Negative.   Skin: Negative.  Negative for rash.  Neurological: Negative.  Negative for sensory change, focal weakness and weakness.  Endo/Heme/Allergies: Does not bruise/bleed easily.  Psychiatric/Behavioral: Negative.  The patient is not nervous/anxious.     As per HPI. Otherwise, a complete review of systems is negatve.  PAST MEDICAL HISTORY: Past Medical History:  Diagnosis Date  . Cellulitis    left leg    PAST SURGICAL HISTORY: Past Surgical History:  Procedure Laterality Date  . TONSILLECTOMY      FAMILY HISTORY: Family History  Problem Relation  Age of Onset  . Hypertension Mother   . Diabetes Mother        ADVANCED DIRECTIVES:    HEALTH MAINTENANCE: Social History   Tobacco Use  . Smoking status: Never Smoker  . Smokeless tobacco: Never Used  Substance Use Topics  . Alcohol use: No  . Drug use: No     Colonoscopy:  PAP:  Bone density:  Lipid panel:  Allergies  Allergen Reactions  . Augmentin [Amoxicillin-Pot Clavulanate] Hives    Current Outpatient Medications  Medication Sig Dispense Refill  . lidocaine-prilocaine (EMLA) cream Apply 1 application topically as needed. Apply to port 1-2 hours prior to chemotherapy appointment. Cover with plastic wrap. 30 g 0  . predniSONE (DELTASONE) 20 MG tablet Take 5 tablets (100 mg total) by mouth daily with breakfast. Take for 3 days 15 tablet 0   No current facility-administered medications for this visit.     OBJECTIVE: There were no vitals filed for this visit.   There is no height or weight on file to calculate BMI.    ECOG FS:0 - Asymptomatic  General: Well-developed, well-nourished, no acute distress. Eyes: Pink conjunctiva, anicteric sclera. HEENT: Normocephalic, moist mucous membranes. Lungs: Clear to auscultation bilaterally. Heart: Regular rate and rhythm. No rubs, murmurs, or gallops. Abdomen: Soft, nontender, nondistended. No organomegaly noted, normoactive bowel sounds. Musculoskeletal: No edema, cyanosis, or clubbing. Neuro: Alert, answering all questions appropriately. Cranial nerves grossly intact. Skin: No rashes or petechiae noted. Psych: Normal affect.  LAB RESULTS:  Lab Results  Component Value Date   NA 138 07/01/2017   K 3.9 07/01/2017   CL 107 07/01/2017   CO2 27 07/01/2017  GLUCOSE 104 (H) 07/01/2017   BUN 15 07/01/2017   CREATININE 0.73 07/01/2017   CALCIUM 8.8 (L) 07/01/2017   PROT 8.1 07/01/2017   ALBUMIN 4.0 07/01/2017   AST 21 07/01/2017   ALT 18 07/01/2017   ALKPHOS 65 07/01/2017   BILITOT 0.8 07/01/2017   GFRNONAA >60  07/01/2017   GFRAA >60 07/01/2017    Lab Results  Component Value Date   WBC 7.2 09/17/2017   NEUTROABS 5.5 09/17/2017   HGB 12.4 (L) 09/17/2017   HCT 38.5 (L) 09/17/2017   MCV 79.5 (L) 09/17/2017   PLT 41 (L) 09/17/2017   Lab Results  Component Value Date   IRON 50 09/09/2015   TIBC 320 09/09/2015   IRONPCTSAT 16 (L) 09/09/2015   Lab Results  Component Value Date   FERRITIN 75 09/09/2015     STUDIES: No results found.  ASSESSMENT: ITP.  PLAN:     1.  ITP: Previously, patient received 5 days of 100 mg prednisone with an excellent response to his platelets increasing to greater than 200.  Unfortunately, response was not durable. 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  Splenic ultrasound did not reveal any evidence of splenomegaly.  Bone marrow biopsy was not completed secondary to technical difficulties with patient's body habitus.  Because treatment with prednisone was not durable, will pursue weekly Rituxan x4.  Proceed with cycle 1 of 4 of weekly Rituxan today.  Because patient did not get his port placed, he will receive a 3-day treatment of 100 mg prednisone in order to have his port placed on Monday.  Return to clinic in 1 week for further evaluation and consideration of cycle 2.    2.  Poor venous access: Proceed with port as above.  I spent a total of 30 minutes face-to-face with the patient of which greater than 50% of the visit was spent in counseling and coordination of care as detailed above.   Patient expressed understanding and was in agreement with this plan. He also understands that He can call clinic at any time with any questions, concerns, or complaints.    Lloyd Huger, MD   09/17/2017 10:25 AM

## 2017-09-17 ENCOUNTER — Other Ambulatory Visit: Payer: Self-pay | Admitting: Oncology

## 2017-09-17 ENCOUNTER — Inpatient Hospital Stay: Payer: BLUE CROSS/BLUE SHIELD | Attending: Oncology

## 2017-09-17 ENCOUNTER — Inpatient Hospital Stay: Payer: BLUE CROSS/BLUE SHIELD

## 2017-09-17 ENCOUNTER — Inpatient Hospital Stay (HOSPITAL_BASED_OUTPATIENT_CLINIC_OR_DEPARTMENT_OTHER): Payer: BLUE CROSS/BLUE SHIELD | Admitting: Oncology

## 2017-09-17 VITALS — BP 164/106 | HR 73 | Resp 18

## 2017-09-17 DIAGNOSIS — D696 Thrombocytopenia, unspecified: Secondary | ICD-10-CM

## 2017-09-17 DIAGNOSIS — D693 Immune thrombocytopenic purpura: Secondary | ICD-10-CM | POA: Diagnosis present

## 2017-09-17 LAB — CBC WITH DIFFERENTIAL/PLATELET
BASOS ABS: 0 10*3/uL (ref 0–0.1)
Basophils Relative: 0 %
Eosinophils Absolute: 0.1 10*3/uL (ref 0–0.7)
Eosinophils Relative: 1 %
HEMATOCRIT: 38.5 % — AB (ref 40.0–52.0)
Hemoglobin: 12.4 g/dL — ABNORMAL LOW (ref 13.0–18.0)
LYMPHS PCT: 19 %
Lymphs Abs: 1.4 10*3/uL (ref 1.0–3.6)
MCH: 25.5 pg — ABNORMAL LOW (ref 26.0–34.0)
MCHC: 32.1 g/dL (ref 32.0–36.0)
MCV: 79.5 fL — AB (ref 80.0–100.0)
MONO ABS: 0.3 10*3/uL (ref 0.2–1.0)
MONOS PCT: 4 %
NEUTROS ABS: 5.5 10*3/uL (ref 1.4–6.5)
Neutrophils Relative %: 76 %
PLATELETS: 41 10*3/uL — AB (ref 150–440)
RBC: 4.85 MIL/uL (ref 4.40–5.90)
RDW: 16.9 % — AB (ref 11.5–14.5)
WBC: 7.2 10*3/uL (ref 3.8–10.6)

## 2017-09-17 MED ORDER — LIDOCAINE-PRILOCAINE 2.5-2.5 % EX CREA: 1.0000 "application " | TOPICAL_CREAM | CUTANEOUS | 0 refills | Status: DC | PRN

## 2017-09-17 MED ORDER — SODIUM CHLORIDE 0.9 % IV SOLN
375.0000 mg/m2 | Freq: Once | INTRAVENOUS | Status: AC
  Administered 2017-09-17: 1000 mg via INTRAVENOUS
  Filled 2017-09-17: qty 100

## 2017-09-17 MED ORDER — DIPHENHYDRAMINE HCL 25 MG PO CAPS
25.0000 mg | ORAL_CAPSULE | Freq: Once | ORAL | Status: AC
  Administered 2017-09-17: 25 mg via ORAL
  Filled 2017-09-17: qty 1

## 2017-09-17 MED ORDER — PREDNISONE 20 MG PO TABS: 100.0000 mg | ORAL_TABLET | Freq: Every day | ORAL | 0 refills | Status: DC

## 2017-09-17 MED ORDER — SODIUM CHLORIDE 0.9 % IV SOLN
Freq: Once | INTRAVENOUS | Status: AC
  Administered 2017-09-17: 10:00:00 via INTRAVENOUS
  Filled 2017-09-17: qty 1000

## 2017-09-17 MED ORDER — DIPHENHYDRAMINE HCL 50 MG/ML IJ SOLN
25.0000 mg | Freq: Once | INTRAMUSCULAR | Status: AC | PRN
  Administered 2017-09-17: 25 mg via INTRAVENOUS

## 2017-09-17 MED ORDER — ACETAMINOPHEN 325 MG PO TABS
650.0000 mg | ORAL_TABLET | Freq: Once | ORAL | Status: AC
Start: 2017-09-17 — End: 2017-09-17
  Administered 2017-09-17: 650 mg via ORAL
  Filled 2017-09-17: qty 2

## 2017-09-17 NOTE — Progress Notes (Signed)
After third bump per protocol of Rituxan pt began complaining of "itching on my head and back", tx stopped and upon assessment found pts back to be red, provider notified. Benadryl given per standard and pt verbalized relief of symptoms. Tx restarted and pt tolerated remainder of tx well with no further s/s.

## 2017-09-20 ENCOUNTER — Other Ambulatory Visit (INDEPENDENT_AMBULATORY_CARE_PROVIDER_SITE_OTHER): Payer: Self-pay | Admitting: Vascular Surgery

## 2017-09-20 ENCOUNTER — Ambulatory Visit
Admission: RE | Admit: 2017-09-20 | Discharge: 2017-09-20 | Disposition: A | Discharge status: Home / Self Care | Payer: BLUE CROSS/BLUE SHIELD | Source: Ambulatory Visit | Attending: Vascular Surgery | Admitting: Vascular Surgery

## 2017-09-20 ENCOUNTER — Encounter: Payer: Self-pay | Admitting: *Deleted

## 2017-09-20 ENCOUNTER — Encounter
Admission: RE | Disposition: A | Discharge status: Home / Self Care | Payer: Self-pay | Source: Ambulatory Visit | Attending: Vascular Surgery

## 2017-09-20 DIAGNOSIS — Z880 Allergy status to penicillin: Secondary | ICD-10-CM | POA: Diagnosis not present

## 2017-09-20 DIAGNOSIS — D696 Thrombocytopenia, unspecified: Secondary | ICD-10-CM

## 2017-09-20 DIAGNOSIS — Z9889 Other specified postprocedural states: Secondary | ICD-10-CM | POA: Diagnosis not present

## 2017-09-20 HISTORY — DX: Immune thrombocytopenic purpura: D69.3

## 2017-09-20 HISTORY — PX: PORTA CATH INSERTION: CATH118285

## 2017-09-20 LAB — CBC
HCT: 37.7 % — ABNORMAL LOW (ref 40.0–52.0)
Hemoglobin: 12.4 g/dL — ABNORMAL LOW (ref 13.0–18.0)
MCH: 26.1 pg (ref 26.0–34.0)
MCHC: 32.8 g/dL (ref 32.0–36.0)
MCV: 79.7 fL — ABNORMAL LOW (ref 80.0–100.0)
PLATELETS: 119 10*3/uL — AB (ref 150–440)
RBC: 4.73 MIL/uL (ref 4.40–5.90)
RDW: 16.8 % — AB (ref 11.5–14.5)
WBC: 8 10*3/uL (ref 3.8–10.6)

## 2017-09-20 SURGERY — PORTA CATH INSERTION
Anesthesia: Moderate Sedation

## 2017-09-20 MED ORDER — MIDAZOLAM HCL 5 MG/5ML IJ SOLN
INTRAMUSCULAR | Status: AC
  Filled 2017-09-20: qty 5

## 2017-09-20 MED ORDER — FENTANYL CITRATE (PF) 100 MCG/2ML IJ SOLN
INTRAMUSCULAR | Status: AC
  Filled 2017-09-20: qty 2

## 2017-09-20 MED ORDER — MIDAZOLAM HCL 2 MG/2ML IJ SOLN
INTRAMUSCULAR | Status: DC | PRN
  Administered 2017-09-20: 1 mg via INTRAVENOUS
  Administered 2017-09-20: 2 mg via INTRAVENOUS

## 2017-09-20 MED ORDER — SODIUM CHLORIDE 0.9 % IV SOLN
INTRAVENOUS | Status: DC
  Administered 2017-09-20: 10:00:00 via INTRAVENOUS

## 2017-09-20 MED ORDER — ONDANSETRON HCL 4 MG/2ML IJ SOLN: 4.0000 mg | Freq: Four times a day (QID) | INTRAMUSCULAR | Status: DC | PRN

## 2017-09-20 MED ORDER — CLINDAMYCIN PHOSPHATE 300 MG/50ML IV SOLN
INTRAVENOUS | Status: AC
  Filled 2017-09-20: qty 50

## 2017-09-20 MED ORDER — CLINDAMYCIN PHOSPHATE 300 MG/50ML IV SOLN
300.0000 mg | Freq: Once | INTRAVENOUS | Status: AC
Start: 2017-09-20 — End: 2017-09-20
  Administered 2017-09-20: 300 mg via INTRAVENOUS

## 2017-09-20 MED ORDER — FENTANYL CITRATE (PF) 100 MCG/2ML IJ SOLN
INTRAMUSCULAR | Status: DC | PRN
  Administered 2017-09-20: 50 ug via INTRAVENOUS
  Administered 2017-09-20 (×2): 12.5 ug via INTRAVENOUS

## 2017-09-20 MED ORDER — LIDOCAINE-EPINEPHRINE 1 %-1:200000 IJ SOLN
INTRAMUSCULAR | Status: AC
Start: 2017-09-20 — End: ?
  Filled 2017-09-20: qty 30

## 2017-09-20 MED ORDER — SODIUM CHLORIDE 0.9 % IV SOLN
Freq: Once | INTRAVENOUS | Status: DC
  Filled 2017-09-20: qty 2

## 2017-09-20 MED ORDER — HEPARIN (PORCINE) IN NACL 1000-0.9 UT/500ML-% IV SOLN
INTRAVENOUS | Status: AC
  Filled 2017-09-20: qty 500

## 2017-09-20 MED ORDER — HYDROMORPHONE HCL 1 MG/ML IJ SOLN: 1.0000 mg | Freq: Once | INTRAMUSCULAR | Status: DC | PRN

## 2017-09-20 SURGICAL SUPPLY — 11 items
COVER PROBE U/S 5X48 (MISCELLANEOUS) ×3 IMPLANT
GAUZE SPONGE 4X4 12PLY STRL (GAUZE/BANDAGES/DRESSINGS) ×3 IMPLANT
KIT PORT POWER 8FR ISP CVUE (Port) ×3 IMPLANT
PACK ANGIOGRAPHY (CUSTOM PROCEDURE TRAY) ×3 IMPLANT
PAD GROUND ADULT SPLIT (MISCELLANEOUS) ×3 IMPLANT
PENCIL ELECTRO HAND CTR (MISCELLANEOUS) ×3 IMPLANT
SUT MNCRL AB 4-0 PS2 18 (SUTURE) ×3 IMPLANT
SUT PROLENE 0 CT 1 30 (SUTURE) ×3 IMPLANT
SUT VIC AB 3-0 SH 27 (SUTURE) ×2
SUT VIC AB 3-0 SH 27X BRD (SUTURE) ×1 IMPLANT
TOWEL OR 17X26 4PK STRL BLUE (TOWEL DISPOSABLE) ×3 IMPLANT

## 2017-09-20 NOTE — Progress Notes (Signed)
Dr. Wyn Quakerew at bedside, speaking with pt. And sister re: procedure. Both verbalized understanding.

## 2017-09-20 NOTE — H&P (Signed)
[facility] History & Physical Update  The patient was interviewed and re-examined.  The patient's previous History and Physical has been reviewed and is unchanged.  There is no change in the plan of care. We plan to proceed with the scheduled procedure.  Festus BarrenJason Dew, MD  09/20/2017, 9:56 AM

## 2017-09-20 NOTE — Op Note (Signed)
      [facility]       Operative Note  Date: 09/20/2017  Preoperative diagnosis:  1. Thrombocytopenia  Postoperative diagnosis:  Same as above  Procedures: #1. Ultrasound guidance for vascular access to the right internal jugular vein. #2. Fluoroscopic guidance for placement of catheter. #3. Placement of CT compatible Port-A-Cath, right internal jugular vein.  Surgeon: Festus BarrenJason Fabian Coca, MD.   Anesthesia: Local with moderate conscious sedation for approximately 30  minutes using 3 mg of Versed and 75 mcg of Fentanyl  Fluoroscopy time: less than 1 minute  Contrast used: 0  Estimated blood loss: 5 cc  Indication for the procedure:  The patient is a 27 y.o.male with thrombocytopenia.  The patient needs a Port-A-Cath for durable venous access, chemotherapy, lab draws, and CT scans. We are asked to place this. Risks and benefits were discussed and informed consent was obtained.  Description of procedure: The patient was brought to the vascular and interventional radiology suite.  Moderate conscious sedation was administered throughout the procedure during a face to face encounter with the patient with my supervision of the RN administering medicines and monitoring the patient's vital signs, pulse oximetry, telemetry and mental status throughout from the start of the procedure until the patient was taken to the recovery room. The right neck chest and shoulder were sterilely prepped and draped, and a sterile surgical field was created. Ultrasound was used to help visualize a patent right internal jugular vein. This was then accessed under direct ultrasound guidance without difficulty with the Seldinger needle and a permanent image was recorded. A J-wire was placed. After skin nick and dilatation, the peel-away sheath was then placed over the wire. I then anesthetized an area under the clavicle approximately 1-2 fingerbreadths. A transverse incision was created and an inferior pocket  was created with electrocautery and blunt dissection. The port was then brought onto the field, placed into the pocket and secured to the chest wall with 2 Prolene sutures. The catheter was connected to the port and tunneled from the subclavicular incision to the access site. Fluoroscopic guidance was then used to cut the catheter to an appropriate length. The catheter was then placed through the peel-away sheath and the peel-away sheath was removed. The catheter tip was parked in excellent location under fluorocoscopic guidance in the cavoatrial junction. The pocket was then irrigated with antibiotic impregnated saline and the wound was closed with a running 3-0 Vicryl and a 4-0 Monocryl. The access incision was closed with a single 4-0 Monocryl. The Huber needle was used to withdraw blood and flush the port with heparinized saline. Dermabond was then placed as a dressing. The patient tolerated the procedure well and was taken to the recovery room in stable condition.   Festus BarrenJason Seng Fouts 09/20/2017 12:35 PM   This note was created with Dragon Medical transcription system. Any errors in dictation are purely unintentional.

## 2017-09-24 ENCOUNTER — Inpatient Hospital Stay (HOSPITAL_BASED_OUTPATIENT_CLINIC_OR_DEPARTMENT_OTHER): Payer: BLUE CROSS/BLUE SHIELD | Admitting: Oncology

## 2017-09-24 ENCOUNTER — Inpatient Hospital Stay: Payer: BLUE CROSS/BLUE SHIELD

## 2017-09-24 VITALS — BP 106/76 | HR 66 | Temp 97.5°F | Resp 18 | Ht 66.0 in | Wt 367.0 lb

## 2017-09-24 DIAGNOSIS — D693 Immune thrombocytopenic purpura: Secondary | ICD-10-CM | POA: Diagnosis not present

## 2017-09-24 DIAGNOSIS — D696 Thrombocytopenia, unspecified: Secondary | ICD-10-CM

## 2017-09-24 LAB — CBC WITH DIFFERENTIAL/PLATELET
Basophils Absolute: 0 10*3/uL (ref 0–0.1)
Basophils Relative: 0 %
EOS ABS: 0.1 10*3/uL (ref 0–0.7)
Eosinophils Relative: 1 %
HEMATOCRIT: 37.1 % — AB (ref 40.0–52.0)
HEMOGLOBIN: 12 g/dL — AB (ref 13.0–18.0)
LYMPHS ABS: 1.1 10*3/uL (ref 1.0–3.6)
Lymphocytes Relative: 20 %
MCH: 25.4 pg — ABNORMAL LOW (ref 26.0–34.0)
MCHC: 32.5 g/dL (ref 32.0–36.0)
MCV: 78.1 fL — ABNORMAL LOW (ref 80.0–100.0)
MONOS PCT: 4 %
Monocytes Absolute: 0.2 10*3/uL (ref 0.2–1.0)
NEUTROS PCT: 75 %
Neutro Abs: 4.3 10*3/uL (ref 1.4–6.5)
Platelets: 16 10*3/uL — CL (ref 150–440)
RBC: 4.75 MIL/uL (ref 4.40–5.90)
RDW: 16.7 % — ABNORMAL HIGH (ref 11.5–14.5)
WBC: 5.7 10*3/uL (ref 3.8–10.6)

## 2017-09-24 MED ORDER — SODIUM CHLORIDE 0.9 % IV SOLN
Freq: Once | INTRAVENOUS | Status: AC
  Administered 2017-09-24: 10:00:00 via INTRAVENOUS
  Filled 2017-09-24: qty 1000

## 2017-09-24 MED ORDER — SODIUM CHLORIDE 0.9 % IV SOLN: 375.0000 mg/m2 | Freq: Once | INTRAVENOUS | Status: DC

## 2017-09-24 MED ORDER — DEXAMETHASONE SODIUM PHOSPHATE 10 MG/ML IJ SOLN
10.0000 mg | Freq: Once | INTRAMUSCULAR | Status: AC
  Administered 2017-09-24: 10 mg via INTRAVENOUS
  Filled 2017-09-24: qty 1

## 2017-09-24 MED ORDER — ACETAMINOPHEN 325 MG PO TABS
650.0000 mg | ORAL_TABLET | Freq: Once | ORAL | Status: AC
  Administered 2017-09-24: 650 mg via ORAL
  Filled 2017-09-24: qty 2

## 2017-09-24 MED ORDER — FAMOTIDINE IN NACL 20-0.9 MG/50ML-% IV SOLN
20.0000 mg | Freq: Two times a day (BID) | INTRAVENOUS | Status: DC
  Administered 2017-09-24: 20 mg via INTRAVENOUS
  Filled 2017-09-24: qty 50

## 2017-09-24 MED ORDER — RITUXIMAB CHEMO INJECTION 500 MG/50ML
375.0000 mg/m2 | Freq: Once | INTRAVENOUS | Status: AC
  Administered 2017-09-24: 1000 mg via INTRAVENOUS
  Filled 2017-09-24: qty 100

## 2017-09-24 MED ORDER — SODIUM CHLORIDE 0.9 % IV SOLN: 10.0000 mg | Freq: Once | INTRAVENOUS | Status: DC

## 2017-09-24 MED ORDER — HEPARIN SOD (PORK) LOCK FLUSH 100 UNIT/ML IV SOLN
500.0000 [IU] | Freq: Once | INTRAVENOUS | Status: AC | PRN
  Administered 2017-09-24: 500 [IU]
  Filled 2017-09-24: qty 5

## 2017-09-24 MED ORDER — DIPHENHYDRAMINE HCL 50 MG/ML IJ SOLN
25.0000 mg | Freq: Once | INTRAMUSCULAR | Status: AC
  Administered 2017-09-24: 25 mg via INTRAVENOUS
  Filled 2017-09-24: qty 1

## 2017-09-24 NOTE — Progress Notes (Signed)
[place]  Telephone:(336) [phone] [phone]) [phone]  ID: Samuel Rojas OB: [birth year]  MR#: [id number]  WIO#:[id number]  Patient Care Team: [facility] [place] as PCP - General (General Practice)  CHIEF COMPLAINT: ITP.  INTERVAL HISTORY: Patient returns to clinic today for repeat laboratory work, further evaluation, and consideration of cycle 2 of 4 of weekly Rituxan.  He tolerated his first infusion well without significant side effects.  He had his port placed earlier this week. He currently feels well and is asymptomatic.  He denies any easy bleeding or bruising. He has no neurologic complaints. He denies any recent fevers or illnesses. He has a good appetite and denies weight loss. He has no chest pain or shortness of breath. He denies any nausea, vomiting, constipation, or diarrhea. He has no urinary complaints.  Patient feels at his baseline offers no specific complaints today.  REVIEW OF SYSTEMS:   Review of Systems  Constitutional: Negative.  Negative for fever, malaise/fatigue and weight loss.  HENT: Negative.  Negative for nosebleeds.   Respiratory: Negative.  Negative for cough, hemoptysis and shortness of breath.   Cardiovascular: Negative.  Negative for chest pain and leg swelling.  Gastrointestinal: Negative.  Negative for abdominal pain, blood in stool and melena.  Genitourinary: Negative.  Negative for hematuria.  Musculoskeletal: Negative.   Skin: Negative.  Negative for rash.  Neurological: Negative.  Negative for sensory change, focal weakness and weakness.  Endo/Heme/Allergies: Does not bruise/bleed easily.  Psychiatric/Behavioral: Negative.  The patient is not nervous/anxious.     As per HPI. Otherwise, a complete review of systems is negative.  PAST MEDICAL HISTORY: Past Medical History:  Diagnosis Date  . Cellulitis    left leg  . Idiopathic thrombocytopenic purpura (ITP) (HCC)     PAST SURGICAL  HISTORY: Past Surgical History:  Procedure Laterality Date  . PORTA CATH INSERTION N/A 09/20/2017   Procedure: PORTA CATH INSERTION;  Surgeon: Algernon Huxley, MD;  Location: [facility];  Service: Cardiovascular;  Laterality: N/A;  . TONSILLECTOMY      FAMILY HISTORY: Family History  Problem Relation Age of Onset  . Hypertension Mother   . Diabetes Mother        ADVANCED DIRECTIVES:    HEALTH MAINTENANCE: Social History   Tobacco Use  . Smoking status: Never Smoker  . Smokeless tobacco: Never Used  Substance Use Topics  . Alcohol use: No  . Drug use: No     Colonoscopy:  PAP:  Bone density:  Lipid panel:  Allergies  Allergen Reactions  . Augmentin [Amoxicillin-Pot Clavulanate] Hives    Current Outpatient Medications  Medication Sig Dispense Refill  . lidocaine-prilocaine (EMLA) cream Apply 1 application topically as needed. Apply to port 1-2 hours prior to chemotherapy appointment. Cover with plastic wrap. 30 g 0  . predniSONE (DELTASONE) 20 MG tablet Take 5 tablets (100 mg total) by mouth daily with breakfast. Take for 3 days 15 tablet 0   No current facility-administered medications for this visit.    Facility-Administered Medications Ordered in Other Visits  Medication Dose Route Frequency Provider Last Rate Last Dose  . famotidine (PEPCID) IVPB 20 mg premix  20 mg Intravenous Q12H Lloyd Huger, MD   Stopped at 09/24/17 1010  . [COMPLETED] heparin lock flush 100 unit/mL  500 Units Intracatheter Once PRN Lloyd Huger, MD   500 Units at 09/24/17 1142    OBJECTIVE: There were no vitals filed for this visit.   There  is no height or weight on file to calculate BMI.    ECOG FS:0 - Asymptomatic  General: Well-developed, well-nourished, no acute distress. Eyes: Pink conjunctiva, anicteric sclera. HEENT: Normocephalic, moist mucous membranes. Lungs: Clear to auscultation bilaterally. Heart: Regular rate and rhythm. No rubs, murmurs, or  gallops. Abdomen: Soft, nontender, nondistended. No organomegaly noted, normoactive bowel sounds. Musculoskeletal: No edema, cyanosis, or clubbing. Neuro: Alert, answering all questions appropriately. Cranial nerves grossly intact. Skin: No rashes or petechiae noted. Psych: Normal affect.  LAB RESULTS:  Lab Results  Component Value Date   NA 138 07/01/2017   K 3.9 07/01/2017   CL 107 07/01/2017   CO2 27 07/01/2017   GLUCOSE 104 (H) 07/01/2017   BUN 15 07/01/2017   CREATININE 0.73 07/01/2017   CALCIUM 8.8 (L) 07/01/2017   PROT 8.1 07/01/2017   ALBUMIN 4.0 07/01/2017   AST 21 07/01/2017   ALT 18 07/01/2017   ALKPHOS 65 07/01/2017   BILITOT 0.8 07/01/2017   GFRNONAA >60 07/01/2017   GFRAA >60 07/01/2017    Lab Results  Component Value Date   WBC 5.7 09/24/2017   NEUTROABS 4.3 09/24/2017   HGB 12.0 (L) 09/24/2017   HCT 37.1 (L) 09/24/2017   MCV 78.1 (L) 09/24/2017   PLT 16 (LL) 09/24/2017   Lab Results  Component Value Date   IRON 50 09/09/2015   TIBC 320 09/09/2015   IRONPCTSAT 16 (L) 09/09/2015   Lab Results  Component Value Date   FERRITIN 75 09/09/2015     STUDIES: No results found.  ASSESSMENT: ITP.  PLAN:     1.  ITP: Previously, patient received 5 days of 100 mg prednisone with an excellent response to his platelets increasing to greater than 200.  Unfortunately, response was not durable. 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  Splenic ultrasound did not reveal any evidence of splenomegaly.  Bone marrow biopsy was not completed secondary to technical difficulties with patient's body habitus.  Because treatment with prednisone was not durable, will pursue weekly Rituxan x4.  Proceed with cycle 2 of 4 of weekly Rituxan today.  Return to clinic in 1 week for further evaluation and consideration of cycle 3.      2.  Poor venous access: Port has finally been placed.  I spent a total of 30 minutes face-to-face  with the patient of which greater than 50% of the visit was spent in counseling and coordination of care as detailed above.   Patient expressed understanding and was in agreement with this plan. He also understands that He can call clinic at any time with any questions, concerns, or complaints.    Lloyd Huger, MD   09/24/2017 10:42 AM

## 2017-09-28 NOTE — Progress Notes (Signed)
[place]  Telephone:(336) [phone] Fax:(336) [phone]  ID: JAILYN LANGHORST OB: September 30, 1990  MR#: [id number]  VWP#:[id number]  Patient Care Team: [facility] [place] as PCP - General (General Practice)  CHIEF COMPLAINT: ITP.  INTERVAL HISTORY: Patient returns to clinic today for repeat laboratory work, further evaluation, consideration of cycle 3 of 4 of weekly Rituxan.  He currently feels well and is asymptomatic. He denies any easy bleeding or bruising. He has no neurologic complaints. He denies any recent fevers or illnesses. He has a good appetite and denies weight loss. He has no chest pain or shortness of breath. He denies any nausea, vomiting, constipation, or diarrhea. He has no urinary complaints.  Patient offers no specific complaints today.  REVIEW OF SYSTEMS:   Review of Systems  Constitutional: Negative.  Negative for fever, malaise/fatigue and weight loss.  HENT: Negative.  Negative for nosebleeds.   Respiratory: Negative.  Negative for cough, hemoptysis and shortness of breath.   Cardiovascular: Negative.  Negative for chest pain and leg swelling.  Gastrointestinal: Negative.  Negative for abdominal pain, blood in stool and melena.  Genitourinary: Negative.  Negative for hematuria.  Musculoskeletal: Negative.   Skin: Negative.  Negative for rash.  Neurological: Negative.  Negative for sensory change, focal weakness and weakness.  Endo/Heme/Allergies: Does not bruise/bleed easily.  Psychiatric/Behavioral: Negative.  The patient is not nervous/anxious.     As per HPI. Otherwise, a complete review of systems is negative.  PAST MEDICAL HISTORY: Past Medical History:  Diagnosis Date  . Cellulitis    left leg  . Idiopathic thrombocytopenic purpura (ITP) (HCC)     PAST SURGICAL HISTORY: Past Surgical History:  Procedure Laterality Date  . PORTA CATH INSERTION N/A 09/20/2017   Procedure: PORTA CATH INSERTION;  Surgeon: Algernon Huxley, MD;  Location: [facility];  Service: Cardiovascular;  Laterality: N/A;  . TONSILLECTOMY      FAMILY HISTORY: Family History  Problem Relation Age of Onset  . Hypertension Mother   . Diabetes Mother        ADVANCED DIRECTIVES:    HEALTH MAINTENANCE: Social History   Tobacco Use  . Smoking status: Never Smoker  . Smokeless tobacco: Never Used  Substance Use Topics  . Alcohol use: No  . Drug use: No     Colonoscopy:  PAP:  Bone density:  Lipid panel:  Allergies  Allergen Reactions  . Augmentin [Amoxicillin-Pot Clavulanate] Hives    Current Outpatient Medications  Medication Sig Dispense Refill  . lidocaine-prilocaine (EMLA) cream Apply 1 application topically as needed. Apply to port 1-2 hours prior to chemotherapy appointment. Cover with plastic wrap. 30 g 0  . predniSONE (DELTASONE) 20 MG tablet Take 5 tablets (100 mg total) by mouth daily with breakfast. Take for 3 days 15 tablet 0   No current facility-administered medications for this visit.    Facility-Administered Medications Ordered in Other Visits  Medication Dose Route Frequency Provider Last Rate Last Dose  . famotidine (PEPCID) IVPB 20 mg premix  20 mg Intravenous Q12H Lloyd Huger, MD   Stopped at 10/01/17 1001  . sodium chloride flush (NS) 0.9 % injection 10 mL  10 mL Intracatheter PRN Lloyd Huger, MD   10 mL at 10/01/17 0855    OBJECTIVE: There were no vitals filed for this visit.   There is no height or weight on file to calculate BMI.    ECOG FS:0 - Asymptomatic  General: Well-developed, well-nourished, no acute  distress. Eyes: Pink conjunctiva, anicteric sclera. HEENT: Normocephalic, moist mucous membranes. Lungs: Clear to auscultation bilaterally. Heart: Regular rate and rhythm. No rubs, murmurs, or gallops. Abdomen: Soft, nontender, nondistended. No organomegaly noted, normoactive bowel sounds. Musculoskeletal: No edema, cyanosis, or clubbing. Neuro: Alert,  answering all questions appropriately. Cranial nerves grossly intact. Skin: No rashes or petechiae noted. Psych: Normal affect.  LAB RESULTS:  Lab Results  Component Value Date   NA 138 07/01/2017   K 3.9 07/01/2017   CL 107 07/01/2017   CO2 27 07/01/2017   GLUCOSE 104 (H) 07/01/2017   BUN 15 07/01/2017   CREATININE 0.73 07/01/2017   CALCIUM 8.8 (L) 07/01/2017   PROT 8.1 07/01/2017   ALBUMIN 4.0 07/01/2017   AST 21 07/01/2017   ALT 18 07/01/2017   ALKPHOS 65 07/01/2017   BILITOT 0.8 07/01/2017   GFRNONAA >60 07/01/2017   GFRAA >60 07/01/2017    Lab Results  Component Value Date   WBC 5.5 10/01/2017   NEUTROABS 4.2 10/01/2017   HGB 12.1 (L) 10/01/2017   HCT 37.1 (L) 10/01/2017   MCV 77.8 (L) 10/01/2017   PLT 8 (LL) 10/01/2017   Lab Results  Component Value Date   IRON 50 09/09/2015   TIBC 320 09/09/2015   IRONPCTSAT 16 (L) 09/09/2015   Lab Results  Component Value Date   FERRITIN 75 09/09/2015     STUDIES: No results found.  ASSESSMENT: ITP.  PLAN:     1.  ITP: Previously, patient received 5 days of 100 mg prednisone with an excellent response to his platelets increasing to greater than 200.  Unfortunately, response was not durable. 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  Splenic ultrasound did not reveal any evidence of splenomegaly.  Bone marrow biopsy was not completed secondary to technical difficulties with patient's body habitus.  Although patient's platelet count has decreased to 8, will proceed with cycle 3 of 4 of weekly Rituxan today.  Patient was given a 3-day course of 100 mg prednisone as well.  Return to clinic in 1 week for further evaluation and consideration of cycle 4.       2.  Poor venous access: Port has finally been placed.  I spent a total of 30 minutes face-to-face with the patient of which greater than 50% of the visit was spent in counseling and coordination of care as detailed  above.    Patient expressed understanding and was in agreement with this plan. He also understands that He can call clinic at any time with any questions, concerns, or complaints.    Lloyd Huger, MD   10/01/2017 12:45 PM

## 2017-10-01 ENCOUNTER — Inpatient Hospital Stay: Payer: BLUE CROSS/BLUE SHIELD

## 2017-10-01 ENCOUNTER — Ambulatory Visit: Payer: BLUE CROSS/BLUE SHIELD | Admitting: Oncology

## 2017-10-01 ENCOUNTER — Other Ambulatory Visit: Payer: BLUE CROSS/BLUE SHIELD

## 2017-10-01 ENCOUNTER — Inpatient Hospital Stay (HOSPITAL_BASED_OUTPATIENT_CLINIC_OR_DEPARTMENT_OTHER): Payer: BLUE CROSS/BLUE SHIELD | Admitting: Oncology

## 2017-10-01 ENCOUNTER — Ambulatory Visit: Payer: BLUE CROSS/BLUE SHIELD

## 2017-10-01 ENCOUNTER — Encounter: Payer: Self-pay | Admitting: Oncology

## 2017-10-01 VITALS — BP 120/76 | HR 67 | Temp 97.6°F | Resp 18

## 2017-10-01 DIAGNOSIS — D693 Immune thrombocytopenic purpura: Secondary | ICD-10-CM | POA: Diagnosis not present

## 2017-10-01 DIAGNOSIS — D696 Thrombocytopenia, unspecified: Secondary | ICD-10-CM

## 2017-10-01 LAB — CBC WITH DIFFERENTIAL/PLATELET
BASOS PCT: 0 %
Basophils Absolute: 0 10*3/uL (ref 0–0.1)
EOS ABS: 0.1 10*3/uL (ref 0–0.7)
Eosinophils Relative: 1 %
HCT: 37.1 % — ABNORMAL LOW (ref 40.0–52.0)
HEMOGLOBIN: 12.1 g/dL — AB (ref 13.0–18.0)
LYMPHS ABS: 1 10*3/uL (ref 1.0–3.6)
Lymphocytes Relative: 18 %
MCH: 25.3 pg — AB (ref 26.0–34.0)
MCHC: 32.5 g/dL (ref 32.0–36.0)
MCV: 77.8 fL — ABNORMAL LOW (ref 80.0–100.0)
Monocytes Absolute: 0.3 10*3/uL (ref 0.2–1.0)
Monocytes Relative: 5 %
NEUTROS ABS: 4.2 10*3/uL (ref 1.4–6.5)
Neutrophils Relative %: 76 %
Platelets: 8 10*3/uL — CL (ref 150–440)
RBC: 4.77 MIL/uL (ref 4.40–5.90)
RDW: 15.8 % — ABNORMAL HIGH (ref 11.5–14.5)
WBC: 5.5 10*3/uL (ref 3.8–10.6)

## 2017-10-01 MED ORDER — SODIUM CHLORIDE 0.9 % IV SOLN
Freq: Once | INTRAVENOUS | Status: AC
  Administered 2017-10-01: 09:00:00 via INTRAVENOUS
  Filled 2017-10-01: qty 250

## 2017-10-01 MED ORDER — SODIUM CHLORIDE 0.9 % IV SOLN: 375.0000 mg/m2 | Freq: Once | INTRAVENOUS | Status: DC

## 2017-10-01 MED ORDER — ACETAMINOPHEN 325 MG PO TABS
650.0000 mg | ORAL_TABLET | Freq: Once | ORAL | Status: AC
  Administered 2017-10-01: 650 mg via ORAL
  Filled 2017-10-01: qty 2

## 2017-10-01 MED ORDER — DIPHENHYDRAMINE HCL 50 MG/ML IJ SOLN
25.0000 mg | Freq: Once | INTRAMUSCULAR | Status: AC
  Administered 2017-10-01: 25 mg via INTRAVENOUS
  Filled 2017-10-01: qty 1

## 2017-10-01 MED ORDER — HEPARIN SOD (PORK) LOCK FLUSH 100 UNIT/ML IV SOLN
500.0000 [IU] | Freq: Once | INTRAVENOUS | Status: AC | PRN
  Administered 2017-10-01: 500 [IU]
  Filled 2017-10-01: qty 5

## 2017-10-01 MED ORDER — SODIUM CHLORIDE 0.9 % IV SOLN
375.0000 mg/m2 | Freq: Once | INTRAVENOUS | Status: AC
  Administered 2017-10-01: 1000 mg via INTRAVENOUS
  Filled 2017-10-01: qty 100

## 2017-10-01 MED ORDER — PREDNISONE 20 MG PO TABS: 100.0000 mg | ORAL_TABLET | Freq: Every day | ORAL | 0 refills | Status: DC

## 2017-10-01 MED ORDER — DEXAMETHASONE SODIUM PHOSPHATE 10 MG/ML IJ SOLN
10.0000 mg | Freq: Once | INTRAMUSCULAR | Status: AC
  Administered 2017-10-01: 10 mg via INTRAVENOUS
  Filled 2017-10-01: qty 1

## 2017-10-01 MED ORDER — FAMOTIDINE IN NACL 20-0.9 MG/50ML-% IV SOLN
20.0000 mg | Freq: Two times a day (BID) | INTRAVENOUS | Status: DC
  Administered 2017-10-01: 20 mg via INTRAVENOUS
  Filled 2017-10-01: qty 50

## 2017-10-01 MED ORDER — SODIUM CHLORIDE 0.9 % IV SOLN: 10.0000 mg | Freq: Once | INTRAVENOUS | Status: DC

## 2017-10-01 MED ORDER — SODIUM CHLORIDE 0.9% FLUSH
10.0000 mL | INTRAVENOUS | Status: DC | PRN
  Administered 2017-10-01: 10 mL
  Filled 2017-10-01: qty 10

## 2017-10-01 NOTE — Progress Notes (Signed)
Patient denies any concerns today.  

## 2017-10-01 NOTE — Progress Notes (Signed)
Labs verified with MD, ok to proceed with treatment today.

## 2017-10-01 NOTE — Progress Notes (Signed)
Platelet count is 8 today. MD gave order to proceed with Rituxan today. Tolerated treatment well today. MD will prescribe oral steroids for patients low platelet count.

## 2017-10-03 NOTE — Progress Notes (Signed)
[place]  Telephone:(336) [phone] Fax:(336) [phone]  ID: Samuel Rojas OB: February 08, 1991  MR#: [id number]  ACZ#:[id number]  Patient Care Team: [facility] [place] as PCP - General (General Practice)  CHIEF COMPLAINT: ITP.  INTERVAL HISTORY: Patient returns to clinic today for further evaluation and consideration of cycle 404 of weekly Rituxan.  His platelet count has improved, but this is possibly secondary to a 3-day burst of prednisone he received last week.  He continues to feel well and remains asymptomatic. He denies any easy bleeding or bruising. He has no neurologic complaints. He denies any recent fevers or illnesses. He has a good appetite and denies weight loss. He has no chest pain or shortness of breath. He denies any nausea, vomiting, constipation, or diarrhea. He has no urinary complaints.  Patient feels at his baseline offers no specific complaints today.  REVIEW OF SYSTEMS:   Review of Systems  Constitutional: Negative.  Negative for fever, malaise/fatigue and weight loss.  HENT: Negative.  Negative for nosebleeds.   Respiratory: Negative.  Negative for cough, hemoptysis and shortness of breath.   Cardiovascular: Negative.  Negative for chest pain and leg swelling.  Gastrointestinal: Negative.  Negative for abdominal pain, blood in stool and melena.  Genitourinary: Negative.  Negative for hematuria.  Musculoskeletal: Negative.   Skin: Negative.  Negative for rash.  Neurological: Negative.  Negative for sensory change, focal weakness and weakness.  Endo/Heme/Allergies: Does not bruise/bleed easily.  Psychiatric/Behavioral: Negative.  The patient is not nervous/anxious.     As per HPI. Otherwise, a complete review of systems is negative.  PAST MEDICAL HISTORY: Past Medical History:  Diagnosis Date  . Cellulitis    left leg  . Idiopathic thrombocytopenic purpura (ITP) (HCC)     PAST SURGICAL HISTORY: Past Surgical  History:  Procedure Laterality Date  . PORTA CATH INSERTION N/A 09/20/2017   Procedure: PORTA CATH INSERTION;  Surgeon: Algernon Huxley, MD;  Location: [facility];  Service: Cardiovascular;  Laterality: N/A;  . TONSILLECTOMY      FAMILY HISTORY: Family History  Problem Relation Age of Onset  . Hypertension Mother   . Diabetes Mother        ADVANCED DIRECTIVES:    HEALTH MAINTENANCE: Social History   Tobacco Use  . Smoking status: Never Smoker  . Smokeless tobacco: Never Used  Substance Use Topics  . Alcohol use: No  . Drug use: No     Colonoscopy:  PAP:  Bone density:  Lipid panel:  Allergies  Allergen Reactions  . Augmentin [Amoxicillin-Pot Clavulanate] Hives    Current Outpatient Medications  Medication Sig Dispense Refill  . lidocaine-prilocaine (EMLA) cream Apply 1 application topically as needed. Apply to port 1-2 hours prior to chemotherapy appointment. Cover with plastic wrap. 30 g 0  . predniSONE (DELTASONE) 20 MG tablet Take 5 tablets (100 mg total) by mouth daily with breakfast. Take for 3 days 15 tablet 0   No current facility-administered medications for this visit.    Facility-Administered Medications Ordered in Other Visits  Medication Dose Route Frequency Provider Last Rate Last Dose  . famotidine (PEPCID) IVPB 20 mg premix  20 mg Intravenous Q12H Lloyd Huger, MD   Stopped at 10/01/17 1001  . famotidine (PEPCID) IVPB 20 mg premix  20 mg Intravenous Q12H Lloyd Huger, MD   Stopped at 10/08/17 0945  . heparin lock flush 100 unit/mL  500 Units Intracatheter Once PRN Lloyd Huger, MD      .  sodium chloride flush (NS) 0.9 % injection 10 mL  10 mL Intracatheter PRN Lloyd Huger, MD   10 mL at 10/01/17 0855  . sodium chloride flush (NS) 0.9 % injection 10 mL  10 mL Intracatheter PRN Lloyd Huger, MD   10 mL at 10/08/17 0910    OBJECTIVE: There were no vitals filed for this visit.   There is no height or weight  on file to calculate BMI.    ECOG FS:0 - Asymptomatic  General: Well-developed, well-nourished, no acute distress. Eyes: Pink conjunctiva, anicteric sclera. HEENT: Normocephalic, moist mucous membranes. Lungs: Clear to auscultation bilaterally. Heart: Regular rate and rhythm. No rubs, murmurs, or gallops. Abdomen: Soft, nontender, nondistended. No organomegaly noted, normoactive bowel sounds. Musculoskeletal: No edema, cyanosis, or clubbing. Neuro: Alert, answering all questions appropriately. Cranial nerves grossly intact. Skin: No rashes or petechiae noted. Psych: Normal affect.   LAB RESULTS:  Lab Results  Component Value Date   NA 138 07/01/2017   K 3.9 07/01/2017   CL 107 07/01/2017   CO2 27 07/01/2017   GLUCOSE 104 (H) 07/01/2017   BUN 15 07/01/2017   CREATININE 0.73 07/01/2017   CALCIUM 8.8 (L) 07/01/2017   PROT 8.1 07/01/2017   ALBUMIN 4.0 07/01/2017   AST 21 07/01/2017   ALT 18 07/01/2017   ALKPHOS 65 07/01/2017   BILITOT 0.8 07/01/2017   GFRNONAA >60 07/01/2017   GFRAA >60 07/01/2017    Lab Results  Component Value Date   WBC 6.4 10/08/2017   NEUTROABS 4.7 10/08/2017   HGB 12.4 (L) 10/08/2017   HCT 38.2 (L) 10/08/2017   MCV 78.8 (L) 10/08/2017   PLT 109 (L) 10/08/2017   Lab Results  Component Value Date   IRON 50 09/09/2015   TIBC 320 09/09/2015   IRONPCTSAT 16 (L) 09/09/2015   Lab Results  Component Value Date   FERRITIN 75 09/09/2015     STUDIES: No results found.  ASSESSMENT: ITP.  PLAN:     1.  ITP: Previously, patient received 5 days of 100 mg prednisone with an excellent response to his platelets increasing to greater than 200.  Unfortunately, response was not durable. 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  Splenic ultrasound did not reveal any evidence of splenomegaly.  Bone marrow biopsy was not completed secondary to technical difficulties with patient's body habitus.  Patient was  given a burst dose of prednisone for 3 days with improvement of his platelet count 209.  Proceed with cycle 4 of 4 of weekly Rituxan today.  Return to clinic in 1 week for laboratory work only and then in 2 weeks for laboratory work and further evaluation.   2.  Poor venous access: Port has finally been placed.  I spent a total of 30 minutes face-to-face with the patient of which greater than 50% of the visit was spent in counseling and coordination of care as detailed above.     Patient expressed understanding and was in agreement with this plan. He also understands that He can call clinic at any time with any questions, concerns, or complaints.    Lloyd Huger, MD   10/08/2017 10:03 AM

## 2017-10-08 ENCOUNTER — Inpatient Hospital Stay: Payer: BLUE CROSS/BLUE SHIELD

## 2017-10-08 ENCOUNTER — Inpatient Hospital Stay (HOSPITAL_BASED_OUTPATIENT_CLINIC_OR_DEPARTMENT_OTHER): Payer: BLUE CROSS/BLUE SHIELD | Admitting: Oncology

## 2017-10-08 VITALS — Wt 362.7 lb

## 2017-10-08 DIAGNOSIS — D693 Immune thrombocytopenic purpura: Secondary | ICD-10-CM | POA: Diagnosis not present

## 2017-10-08 DIAGNOSIS — D696 Thrombocytopenia, unspecified: Secondary | ICD-10-CM

## 2017-10-08 LAB — CBC WITH DIFFERENTIAL/PLATELET
Basophils Absolute: 0 10*3/uL (ref 0–0.1)
Basophils Relative: 1 %
Eosinophils Absolute: 0.1 10*3/uL (ref 0–0.7)
Eosinophils Relative: 1 %
HEMATOCRIT: 38.2 % — AB (ref 40.0–52.0)
Hemoglobin: 12.4 g/dL — ABNORMAL LOW (ref 13.0–18.0)
LYMPHS ABS: 1.3 10*3/uL (ref 1.0–3.6)
Lymphocytes Relative: 21 %
MCH: 25.6 pg — AB (ref 26.0–34.0)
MCHC: 32.4 g/dL (ref 32.0–36.0)
MCV: 78.8 fL — AB (ref 80.0–100.0)
MONO ABS: 0.3 10*3/uL (ref 0.2–1.0)
MONOS PCT: 4 %
NEUTROS ABS: 4.7 10*3/uL (ref 1.4–6.5)
NEUTROS PCT: 73 %
PLATELETS: 109 10*3/uL — AB (ref 150–440)
RBC: 4.85 MIL/uL (ref 4.40–5.90)
RDW: 16.2 % — AB (ref 11.5–14.5)
WBC: 6.4 10*3/uL (ref 3.8–10.6)

## 2017-10-08 MED ORDER — DIPHENHYDRAMINE HCL 50 MG/ML IJ SOLN
25.0000 mg | Freq: Once | INTRAMUSCULAR | Status: AC
  Administered 2017-10-08: 25 mg via INTRAVENOUS
  Filled 2017-10-08: qty 1

## 2017-10-08 MED ORDER — SODIUM CHLORIDE 0.9 % IV SOLN
375.0000 mg/m2 | Freq: Once | INTRAVENOUS | Status: AC
  Administered 2017-10-08: 1000 mg via INTRAVENOUS
  Filled 2017-10-08: qty 100

## 2017-10-08 MED ORDER — ACETAMINOPHEN 325 MG PO TABS
650.0000 mg | ORAL_TABLET | Freq: Once | ORAL | Status: AC
  Administered 2017-10-08: 650 mg via ORAL
  Filled 2017-10-08: qty 2

## 2017-10-08 MED ORDER — FAMOTIDINE IN NACL 20-0.9 MG/50ML-% IV SOLN
20.0000 mg | Freq: Two times a day (BID) | INTRAVENOUS | Status: DC
  Administered 2017-10-08: 20 mg via INTRAVENOUS
  Filled 2017-10-08: qty 50

## 2017-10-08 MED ORDER — SODIUM CHLORIDE 0.9 % IV SOLN
Freq: Once | INTRAVENOUS | Status: AC
  Administered 2017-10-08: 09:00:00 via INTRAVENOUS
  Filled 2017-10-08: qty 250

## 2017-10-08 MED ORDER — DEXAMETHASONE SODIUM PHOSPHATE 10 MG/ML IJ SOLN
10.0000 mg | Freq: Once | INTRAMUSCULAR | Status: AC
  Administered 2017-10-08: 10 mg via INTRAVENOUS
  Filled 2017-10-08: qty 1

## 2017-10-08 MED ORDER — HEPARIN SOD (PORK) LOCK FLUSH 100 UNIT/ML IV SOLN
500.0000 [IU] | Freq: Once | INTRAVENOUS | Status: AC | PRN
  Administered 2017-10-08: 500 [IU]
  Filled 2017-10-08: qty 5

## 2017-10-08 MED ORDER — SODIUM CHLORIDE 0.9% FLUSH
10.0000 mL | INTRAVENOUS | Status: DC | PRN
  Administered 2017-10-08: 10 mL
  Filled 2017-10-08: qty 10

## 2017-10-08 MED ORDER — SODIUM CHLORIDE 0.9 % IV SOLN: 375.0000 mg/m2 | Freq: Once | INTRAVENOUS | Status: DC

## 2017-10-08 NOTE — Progress Notes (Signed)
Patient seen in infusion.  

## 2017-10-15 ENCOUNTER — Inpatient Hospital Stay: Payer: BLUE CROSS/BLUE SHIELD | Attending: Oncology

## 2017-10-15 DIAGNOSIS — D696 Thrombocytopenia, unspecified: Secondary | ICD-10-CM

## 2017-10-15 DIAGNOSIS — D693 Immune thrombocytopenic purpura: Secondary | ICD-10-CM | POA: Diagnosis not present

## 2017-10-15 LAB — CBC WITH DIFFERENTIAL/PLATELET
BASOS ABS: 0 10*3/uL (ref 0–0.1)
BASOS PCT: 0 %
Eosinophils Absolute: 0.1 10*3/uL (ref 0–0.7)
Eosinophils Relative: 1 %
HCT: 41.3 % (ref 40.0–52.0)
HEMOGLOBIN: 13.3 g/dL (ref 13.0–18.0)
LYMPHS PCT: 24 %
Lymphs Abs: 1.2 10*3/uL (ref 1.0–3.6)
MCH: 25.3 pg — ABNORMAL LOW (ref 26.0–34.0)
MCHC: 32.3 g/dL (ref 32.0–36.0)
MCV: 78.2 fL — AB (ref 80.0–100.0)
MONOS PCT: 6 %
Monocytes Absolute: 0.3 10*3/uL (ref 0.2–1.0)
NEUTROS PCT: 69 %
Neutro Abs: 3.4 10*3/uL (ref 1.4–6.5)
Platelets: 70 10*3/uL — ABNORMAL LOW (ref 150–440)
RBC: 5.28 MIL/uL (ref 4.40–5.90)
RDW: 15.6 % — ABNORMAL HIGH (ref 11.5–14.5)
WBC: 5 10*3/uL (ref 3.8–10.6)

## 2017-10-22 ENCOUNTER — Inpatient Hospital Stay: Payer: BLUE CROSS/BLUE SHIELD

## 2017-10-22 ENCOUNTER — Encounter: Payer: Self-pay | Admitting: Oncology

## 2017-10-22 ENCOUNTER — Inpatient Hospital Stay (HOSPITAL_BASED_OUTPATIENT_CLINIC_OR_DEPARTMENT_OTHER): Payer: BLUE CROSS/BLUE SHIELD | Admitting: Oncology

## 2017-10-22 VITALS — BP 137/83 | HR 64 | Temp 97.9°F | Resp 18 | Wt 364.2 lb

## 2017-10-22 DIAGNOSIS — D693 Immune thrombocytopenic purpura: Secondary | ICD-10-CM

## 2017-10-22 DIAGNOSIS — D696 Thrombocytopenia, unspecified: Secondary | ICD-10-CM

## 2017-10-22 LAB — CBC WITH DIFFERENTIAL/PLATELET
BASOS ABS: 0 10*3/uL (ref 0–0.1)
BASOS PCT: 1 %
EOS ABS: 0.1 10*3/uL (ref 0–0.7)
Eosinophils Relative: 1 %
HCT: 39.3 % — ABNORMAL LOW (ref 40.0–52.0)
Hemoglobin: 12.8 g/dL — ABNORMAL LOW (ref 13.0–18.0)
Lymphocytes Relative: 26 %
Lymphs Abs: 1.4 10*3/uL (ref 1.0–3.6)
MCH: 25.4 pg — ABNORMAL LOW (ref 26.0–34.0)
MCHC: 32.5 g/dL (ref 32.0–36.0)
MCV: 78.1 fL — ABNORMAL LOW (ref 80.0–100.0)
MONO ABS: 0.3 10*3/uL (ref 0.2–1.0)
MONOS PCT: 5 %
NEUTROS ABS: 3.5 10*3/uL (ref 1.4–6.5)
Neutrophils Relative %: 67 %
PLATELETS: 43 10*3/uL — AB (ref 150–440)
RBC: 5.03 MIL/uL (ref 4.40–5.90)
RDW: 15.9 % — AB (ref 11.5–14.5)
WBC: 5.3 10*3/uL (ref 3.8–10.6)

## 2017-10-22 NOTE — Progress Notes (Signed)
Patient denies any concerns today.  

## 2017-10-22 NOTE — Progress Notes (Signed)
[place]  Telephone:(336) [phone] Fax:(336) [phone]  ID: JOELL USMAN OB: 03-05-1990  MR#: [id number]  GMW#:[id number]  Patient Care Team: [facility] [place] as PCP - General (General Practice)  CHIEF COMPLAINT: ITP.  INTERVAL HISTORY: Patient returns to clinic today for further evaluation and lab results after completing 4 cycles of weekly Rituxan.  Last given on 10/08/2017.  Platelet count continues to trend down.  Last week his platelet count was 70 and today is 43.  He continues to feel well and he remains asymptomatic.  He denies any easy bleeding or bruising.  In the past, he has received short burst of prednisone with significant improvement of his platelets.  He denies any chest pain, shortness of breath, nausea, vomiting, constipation or diarrhea.  REVIEW OF SYSTEMS:   Review of Systems  Constitutional: Negative.  Negative for chills, fever, malaise/fatigue and weight loss.  HENT: Negative for congestion, ear pain and tinnitus.   Eyes: Negative.  Negative for blurred vision and double vision.  Respiratory: Negative.  Negative for cough, sputum production and shortness of breath.   Cardiovascular: Negative.  Negative for chest pain, palpitations and leg swelling.  Gastrointestinal: Negative.  Negative for abdominal pain, constipation, diarrhea, nausea and vomiting.  Genitourinary: Negative for dysuria, frequency and urgency.  Musculoskeletal: Negative for back pain and falls.  Skin: Negative.  Negative for rash.  Neurological: Negative.  Negative for weakness and headaches.  Endo/Heme/Allergies: Negative.  Does not bruise/bleed easily.  Psychiatric/Behavioral: Negative.  Negative for depression. The patient is not nervous/anxious and does not have insomnia.     As per HPI. Otherwise, a complete review of systems is negative.  PAST MEDICAL HISTORY: Past Medical History:  Diagnosis Date  . Cellulitis    left leg  .  Idiopathic thrombocytopenic purpura (ITP) (HCC)     PAST SURGICAL HISTORY: Past Surgical History:  Procedure Laterality Date  . PORTA CATH INSERTION N/A 09/20/2017   Procedure: PORTA CATH INSERTION;  Surgeon: Algernon Huxley, MD;  Location: [facility] [facility];  Service: Cardiovascular;  Laterality: N/A;  . TONSILLECTOMY      FAMILY HISTORY: Family History  Problem Relation Age of Onset  . Hypertension Mother   . Diabetes Mother        ADVANCED DIRECTIVES:    HEALTH MAINTENANCE: Social History   Tobacco Use  . Smoking status: Never Smoker  . Smokeless tobacco: Never Used  Substance Use Topics  . Alcohol use: No  . Drug use: No     Colonoscopy:  PAP:  Bone density:  Lipid panel:  Allergies  Allergen Reactions  . Augmentin [Amoxicillin-Pot Clavulanate] Hives    Current Outpatient Medications  Medication Sig Dispense Refill  . lidocaine-prilocaine (EMLA) cream Apply 1 application topically as needed. Apply to port 1-2 hours prior to chemotherapy appointment. Cover with plastic wrap. 30 g 0   No current facility-administered medications for this visit.    Facility-Administered Medications Ordered in Other Visits  Medication Dose Route Frequency Provider Last Rate Last Dose  . famotidine (PEPCID) IVPB 20 mg premix  20 mg Intravenous Q12H Lloyd Huger, MD   Stopped at 10/01/17 1001  . sodium chloride flush (NS) 0.9 % injection 10 mL  10 mL Intracatheter PRN Lloyd Huger, MD   10 mL at 10/01/17 0855    OBJECTIVE: Vitals:   10/22/17 0955  BP: 137/83  Pulse: 64  Resp: 18  Temp: 97.9 F (36.6 C)     Body  mass index is 58.78 kg/m.    ECOG FS:0 - Asymptomatic  Physical Exam  Constitutional: He is oriented to person, place, and time. Vital signs are normal. He appears well-developed and well-nourished.  obese  HENT:  Head: Normocephalic and atraumatic.  Eyes: Pupils are equal, round, and reactive to light.  Neck: Normal range of motion.    Cardiovascular: Normal rate, regular rhythm and normal heart sounds.  No murmur heard. Pulmonary/Chest: Effort normal and breath sounds normal. He has no wheezes.  Abdominal: Soft. Normal appearance and bowel sounds are normal. He exhibits no distension. There is no tenderness.  Musculoskeletal: Normal range of motion. He exhibits no edema.  Neurological: He is alert and oriented to person, place, and time.  Skin: Skin is warm and dry. No rash noted.  Psychiatric: Judgment normal.   LAB RESULTS:  Lab Results  Component Value Date   NA 138 07/01/2017   K 3.9 07/01/2017   CL 107 07/01/2017   CO2 27 07/01/2017   GLUCOSE 104 (H) 07/01/2017   BUN 15 07/01/2017   CREATININE 0.73 07/01/2017   CALCIUM 8.8 (L) 07/01/2017   PROT 8.1 07/01/2017   ALBUMIN 4.0 07/01/2017   AST 21 07/01/2017   ALT 18 07/01/2017   ALKPHOS 65 07/01/2017   BILITOT 0.8 07/01/2017   GFRNONAA >60 07/01/2017   GFRAA >60 07/01/2017    Lab Results  Component Value Date   WBC 5.3 10/22/2017   NEUTROABS 3.5 10/22/2017   HGB 12.8 (L) 10/22/2017   HCT 39.3 (L) 10/22/2017   MCV 78.1 (L) 10/22/2017   PLT 43 (L) 10/22/2017   Lab Results  Component Value Date   IRON 50 09/09/2015   TIBC 320 09/09/2015   IRONPCTSAT 16 (L) 09/09/2015   Lab Results  Component Value Date   FERRITIN 75 09/09/2015     STUDIES: No results found.  ASSESSMENT: ITP.  PLAN:     1.  ITP/Thrombocytopenia: Previously, patient received 5 doses of 100 mg prednisone with excellent response to his platelet count increasing to greater than 200.  Thrombocytopenia work-up was essentially negative therefore most likely thought diagnosis is ITP.  Unfortunately, bone marrow biopsy was not completed secondary to body habitus.  Splenic ultrasound did not reveal any evidence of splenomegaly.  Recently completed 4 out of 4 weekly Rituxan's.  Last given on 10/08/2017.  Given his platelets continue to drop, we will bring him back next week for labs  to be assessed by Dr. Grayland Ormond.  Patient may need further work-up with bone marrow biopsy (possibly in [place] d/t patient weight) for definitive diagnosis and further treatment planning/options.  Greater than 50% was spent in counseling and coordination of care with this patient including but not limited to discussion of the relevant topics above (See A&P) including, but not limited to diagnosis and management of acute and chronic medical conditions.   Patient expressed understanding and was in agreement with this plan. He also understands that He can call clinic at any time with any questions, concerns, or complaints.    Jacquelin Hawking, NP   10/22/2017 12:34 PM

## 2017-10-24 NOTE — Progress Notes (Signed)
[place]  Telephone:(336) [phone] Fax:(336) [phone]  ID: Samuel Rojas OB: August 20, 1990  MR#: [id number]  [id number]#:[id number]  Patient Care Team: [facility] [place] as PCP - General (General Practice)  CHIEF COMPLAINT: ITP.  INTERVAL HISTORY: Patient returns to clinic today for repeat laboratory work and further evaluation.  He continues to feel well and remains asymptomatic. He denies any easy bleeding or bruising. He has no neurologic complaints. He denies any recent fevers or illnesses. He has a good appetite and denies weight loss. He has no chest pain or shortness of breath. He denies any nausea, vomiting, constipation, or diarrhea. He has no urinary complaints.  Patient feels at his baseline offers no specific complaints today.  REVIEW OF SYSTEMS:   Review of Systems  Constitutional: Negative.  Negative for fever, malaise/fatigue and weight loss.  HENT: Negative.  Negative for nosebleeds.   Respiratory: Negative.  Negative for cough, hemoptysis and shortness of breath.   Cardiovascular: Negative.  Negative for chest pain and leg swelling.  Gastrointestinal: Negative.  Negative for abdominal pain, blood in stool and melena.  Genitourinary: Negative.  Negative for hematuria.  Musculoskeletal: Negative.   Skin: Negative.  Negative for rash.  Neurological: Negative.  Negative for sensory change, focal weakness and weakness.  Endo/Heme/Allergies: Does not bruise/bleed easily.  Psychiatric/Behavioral: Negative.  The patient is not nervous/anxious.     As per HPI. Otherwise, a complete review of systems is negative.  PAST MEDICAL HISTORY: Past Medical History:  Diagnosis Date  . Cellulitis    left leg  . Idiopathic thrombocytopenic purpura (ITP) (HCC)     PAST SURGICAL HISTORY: Past Surgical History:  Procedure Laterality Date  . PORTA CATH INSERTION N/A 09/20/2017   Procedure: PORTA CATH INSERTION;  Surgeon: Algernon Huxley, MD;   Location: [place] [facility];  Service: Cardiovascular;  Laterality: N/A;  . TONSILLECTOMY      FAMILY HISTORY: Family History  Problem Relation Age of Onset  . Hypertension Mother   . Diabetes Mother        ADVANCED DIRECTIVES:    HEALTH MAINTENANCE: Social History   Tobacco Use  . Smoking status: Never Smoker  . Smokeless tobacco: Never Used  Substance Use Topics  . Alcohol use: No  . Drug use: No     Colonoscopy:  PAP:  Bone density:  Lipid panel:  Allergies  Allergen Reactions  . Augmentin [Amoxicillin-Pot Clavulanate] Hives    Current Outpatient Medications  Medication Sig Dispense Refill  . lidocaine-prilocaine (EMLA) cream Apply 1 application topically as needed. Apply to port 1-2 hours prior to chemotherapy appointment. Cover with plastic wrap. 30 g 0   No current facility-administered medications for this visit.    Facility-Administered Medications Ordered in Other Visits  Medication Dose Route Frequency Provider Last Rate Last Dose  . famotidine (PEPCID) IVPB 20 mg premix  20 mg Intravenous Q12H Lloyd Huger, MD   Stopped at 10/01/17 1001  . sodium chloride flush (NS) 0.9 % injection 10 mL  10 mL Intracatheter PRN Lloyd Huger, MD   10 mL at 10/01/17 0855    OBJECTIVE: Vitals:   10/29/17 0917  BP: 129/78  Pulse: (!) 59  Resp: 20  Temp: 98.6 F (37 C)     Body mass index is 58.21 kg/m.    ECOG FS:0 - Asymptomatic  General: Well-developed, well-nourished, no acute distress. Eyes: Pink conjunctiva, anicteric sclera. HEENT: Normocephalic, moist mucous membranes. Lungs: Clear to auscultation bilaterally. Heart: Regular  rate and rhythm. No rubs, murmurs, or gallops. Abdomen: Soft, nontender, nondistended. No organomegaly noted, normoactive bowel sounds. Musculoskeletal: No edema, cyanosis, or clubbing. Neuro: Alert, answering all questions appropriately. Cranial nerves grossly intact. Skin: No rashes or petechiae noted. Psych:  Normal affect.  LAB RESULTS:  Lab Results  Component Value Date   NA 138 07/01/2017   K 3.9 07/01/2017   CL 107 07/01/2017   CO2 27 07/01/2017   GLUCOSE 104 (H) 07/01/2017   BUN 15 07/01/2017   CREATININE 0.73 07/01/2017   CALCIUM 8.8 (L) 07/01/2017   PROT 8.1 07/01/2017   ALBUMIN 4.0 07/01/2017   AST 21 07/01/2017   ALT 18 07/01/2017   ALKPHOS 65 07/01/2017   BILITOT 0.8 07/01/2017   GFRNONAA >60 07/01/2017   GFRAA >60 07/01/2017    Lab Results  Component Value Date   WBC 4.8 10/29/2017   NEUTROABS 3.2 10/29/2017   HGB 13.2 10/29/2017   HCT 41.1 10/29/2017   MCV 78.1 (L) 10/29/2017   PLT 85 (L) 10/29/2017   Lab Results  Component Value Date   IRON 50 09/09/2015   TIBC 320 09/09/2015   IRONPCTSAT 16 (L) 09/09/2015   Lab Results  Component Value Date   FERRITIN 75 09/09/2015     STUDIES: No results found.  ASSESSMENT: ITP.  PLAN:     1.  ITP: Previously, patient received 5 days of 100 mg prednisone with an excellent response to his platelets increasing to greater than 200.  Unfortunately, response was not durable. 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  Splenic ultrasound did not reveal any evidence of splenomegaly.  Bone marrow biopsy was not completed secondary to technical difficulties with patient's body habitus.  Patient completed cycle 4 of 4 of single agent weekly Rituxan on October 08, 2017.  His platelets are now trending up and over 85 today.  No intervention is needed.  Return to clinic in 2 weeks for laboratory work and then in 4 weeks for laboratory work and further evaluation.  2.  Poor venous access: Patient now has a port.  I spent a total of 20 minutes face-to-face with the patient of which greater than 50% of the visit was spent in counseling and coordination of care as detailed above.   Patient expressed understanding and was in agreement with this plan. He also understands that He can call  clinic at any time with any questions, concerns, or complaints.    Lloyd Huger, MD   10/29/2017 10:39 AM

## 2017-10-29 ENCOUNTER — Inpatient Hospital Stay: Payer: BLUE CROSS/BLUE SHIELD

## 2017-10-29 ENCOUNTER — Inpatient Hospital Stay (HOSPITAL_BASED_OUTPATIENT_CLINIC_OR_DEPARTMENT_OTHER): Payer: BLUE CROSS/BLUE SHIELD | Admitting: Oncology

## 2017-10-29 ENCOUNTER — Encounter: Payer: Self-pay | Admitting: Oncology

## 2017-10-29 VITALS — BP 129/78 | HR 59 | Temp 98.6°F | Resp 20 | Wt 360.7 lb

## 2017-10-29 DIAGNOSIS — D693 Immune thrombocytopenic purpura: Secondary | ICD-10-CM | POA: Diagnosis not present

## 2017-10-29 DIAGNOSIS — D696 Thrombocytopenia, unspecified: Secondary | ICD-10-CM

## 2017-10-29 LAB — CBC WITH DIFFERENTIAL/PLATELET
BASOS PCT: 0 %
Basophils Absolute: 0 10*3/uL (ref 0–0.1)
EOS ABS: 0.1 10*3/uL (ref 0–0.7)
Eosinophils Relative: 1 %
HEMATOCRIT: 41.1 % (ref 40.0–52.0)
Hemoglobin: 13.2 g/dL (ref 13.0–18.0)
Lymphocytes Relative: 25 %
Lymphs Abs: 1.2 10*3/uL (ref 1.0–3.6)
MCH: 25.1 pg — ABNORMAL LOW (ref 26.0–34.0)
MCHC: 32.1 g/dL (ref 32.0–36.0)
MCV: 78.1 fL — ABNORMAL LOW (ref 80.0–100.0)
MONO ABS: 0.3 10*3/uL (ref 0.2–1.0)
Monocytes Relative: 7 %
NEUTROS ABS: 3.2 10*3/uL (ref 1.4–6.5)
Neutrophils Relative %: 67 %
Platelets: 85 10*3/uL — ABNORMAL LOW (ref 150–440)
RBC: 5.26 MIL/uL (ref 4.40–5.90)
RDW: 15.6 % — ABNORMAL HIGH (ref 11.5–14.5)
WBC: 4.8 10*3/uL (ref 3.8–10.6)

## 2017-10-29 NOTE — Progress Notes (Signed)
Patient denies any concerns today.  

## 2017-11-12 ENCOUNTER — Inpatient Hospital Stay: Payer: BLUE CROSS/BLUE SHIELD | Attending: Oncology

## 2017-11-12 DIAGNOSIS — D696 Thrombocytopenia, unspecified: Secondary | ICD-10-CM

## 2017-11-12 DIAGNOSIS — D693 Immune thrombocytopenic purpura: Secondary | ICD-10-CM | POA: Insufficient documentation

## 2017-11-12 LAB — CBC WITH DIFFERENTIAL/PLATELET
Basophils Absolute: 0 10*3/uL (ref 0–0.1)
Basophils Relative: 0 %
Eosinophils Absolute: 0.1 10*3/uL (ref 0–0.7)
Eosinophils Relative: 1 %
HEMATOCRIT: 38.5 % — AB (ref 40.0–52.0)
HEMOGLOBIN: 12.5 g/dL — AB (ref 13.0–18.0)
LYMPHS ABS: 1.4 10*3/uL (ref 1.0–3.6)
LYMPHS PCT: 22 %
MCH: 25.3 pg — ABNORMAL LOW (ref 26.0–34.0)
MCHC: 32.6 g/dL (ref 32.0–36.0)
MCV: 77.7 fL — ABNORMAL LOW (ref 80.0–100.0)
MONO ABS: 0.3 10*3/uL (ref 0.2–1.0)
MONOS PCT: 5 %
NEUTROS ABS: 4.3 10*3/uL (ref 1.4–6.5)
NEUTROS PCT: 72 %
Platelets: 58 10*3/uL — ABNORMAL LOW (ref 150–440)
RBC: 4.95 MIL/uL (ref 4.40–5.90)
RDW: 15.3 % — AB (ref 11.5–14.5)
WBC: 6.1 10*3/uL (ref 3.8–10.6)

## 2017-11-21 NOTE — Progress Notes (Signed)
[place]  Telephone:(336) [phone] Fax:(336) [phone]  ID: Samuel Rojas OB: January 01, 1991  MR#: [id number]  [id number]#:[id number]  Patient Care Team: [facility] [place] as PCP - General (General Practice)  CHIEF COMPLAINT: ITP.  INTERVAL HISTORY: Patient returns to clinic today for repeat laboratory work and further evaluation.  He continues to feel well and remains asymptomatic. He denies any easy bleeding or bruising. He has no neurologic complaints. He denies any recent fevers or illnesses. He has a good appetite and denies weight loss. He has no chest pain or shortness of breath. He denies any nausea, vomiting, constipation, or diarrhea. He has no urinary complaints.  Patient feels at his baseline offers no specific complaints today.  REVIEW OF SYSTEMS:   Review of Systems  Constitutional: Negative.  Negative for fever, malaise/fatigue and weight loss.  HENT: Negative.  Negative for nosebleeds.   Respiratory: Negative.  Negative for cough, hemoptysis and shortness of breath.   Cardiovascular: Negative.  Negative for chest pain and leg swelling.  Gastrointestinal: Negative.  Negative for abdominal pain, blood in stool and melena.  Genitourinary: Negative.  Negative for hematuria.  Musculoskeletal: Negative.   Skin: Negative.  Negative for rash.  Neurological: Negative.  Negative for sensory change, focal weakness and weakness.  Endo/Heme/Allergies: Does not bruise/bleed easily.  Psychiatric/Behavioral: Negative.  The patient is not nervous/anxious.     As per HPI. Otherwise, a complete review of systems is negative.  PAST MEDICAL HISTORY: Past Medical History:  Diagnosis Date  . Cellulitis    left leg  . Idiopathic thrombocytopenic purpura (ITP) (HCC)     PAST SURGICAL HISTORY: Past Surgical History:  Procedure Laterality Date  . PORTA CATH INSERTION N/A 09/20/2017   Procedure: PORTA CATH INSERTION;  Surgeon: Algernon Huxley, MD;   Location: [place] [facility];  Service: Cardiovascular;  Laterality: N/A;  . TONSILLECTOMY      FAMILY HISTORY: Family History  Problem Relation Age of Onset  . Hypertension Mother   . Diabetes Mother        ADVANCED DIRECTIVES:    HEALTH MAINTENANCE: Social History   Tobacco Use  . Smoking status: Never Smoker  . Smokeless tobacco: Never Used  Substance Use Topics  . Alcohol use: No  . Drug use: No     Colonoscopy:  PAP:  Bone density:  Lipid panel:  Allergies  Allergen Reactions  . Augmentin [Amoxicillin-Pot Clavulanate] Hives    Current Outpatient Medications  Medication Sig Dispense Refill  . lidocaine-prilocaine (EMLA) cream Apply 1 application topically as needed. Apply to port 1-2 hours prior to chemotherapy appointment. Cover with plastic wrap. 30 g 0   No current facility-administered medications for this visit.    Facility-Administered Medications Ordered in Other Visits  Medication Dose Route Frequency Provider Last Rate Last Dose  . famotidine (PEPCID) IVPB 20 mg premix  20 mg Intravenous Q12H Lloyd Huger, MD   Stopped at 10/01/17 1001  . sodium chloride flush (NS) 0.9 % injection 10 mL  10 mL Intracatheter PRN Lloyd Huger, MD   10 mL at 10/01/17 0855    OBJECTIVE: Vitals:   11/26/17 1543  BP: (!) 162/77  Pulse: 70  Resp: 20  Temp: (!) 97.5 F (36.4 C)     Body mass index is 58.59 kg/m.    ECOG FS:0 - Asymptomatic  General: Well-developed, well-nourished, no acute distress. Eyes: Pink conjunctiva, anicteric sclera. HEENT: Normocephalic, moist mucous membranes. Lungs: Clear to auscultation bilaterally. Heart:  Regular rate and rhythm. No rubs, murmurs, or gallops. Abdomen: Soft, nontender, nondistended. No organomegaly noted, normoactive bowel sounds. Musculoskeletal: No edema, cyanosis, or clubbing. Neuro: Alert, answering all questions appropriately. Cranial nerves grossly intact. Skin: No rashes or petechiae  noted. Psych: Normal affect.  LAB RESULTS:  Lab Results  Component Value Date   NA 138 07/01/2017   K 3.9 07/01/2017   CL 107 07/01/2017   CO2 27 07/01/2017   GLUCOSE 104 (H) 07/01/2017   BUN 15 07/01/2017   CREATININE 0.73 07/01/2017   CALCIUM 8.8 (L) 07/01/2017   PROT 8.1 07/01/2017   ALBUMIN 4.0 07/01/2017   AST 21 07/01/2017   ALT 18 07/01/2017   ALKPHOS 65 07/01/2017   BILITOT 0.8 07/01/2017   GFRNONAA >60 07/01/2017   GFRAA >60 07/01/2017    Lab Results  Component Value Date   WBC 6.1 11/26/2017   NEUTROABS 4.4 11/26/2017   HGB 12.4 (L) 11/26/2017   HCT 38.8 (L) 11/26/2017   MCV 77.9 (L) 11/26/2017   PLT 101 (L) 11/26/2017   Lab Results  Component Value Date   IRON 50 09/09/2015   TIBC 320 09/09/2015   IRONPCTSAT 16 (L) 09/09/2015   Lab Results  Component Value Date   FERRITIN 75 09/09/2015     STUDIES: No results found.  ASSESSMENT: ITP.  PLAN:     1.  ITP: Previously, patient received 5 days of 100 mg prednisone with an excellent response to his platelets increasing to greater than 200.  Unfortunately, response was not durable. 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  Splenic ultrasound did not reveal any evidence of splenomegaly.  Bone marrow biopsy was not completed secondary to technical difficulties with patient's body habitus.  Patient completed cycle 4 of 4 of single agent weekly Rituxan on October 08, 2017.  His platelets continue to slowly trend up and and now are 101.  Return to clinic in 1 month for laboratory work only and then in 2 months for laboratory work and further evaluation.   2.  Poor venous access: Patient now has a port.  I spent a total of 20 minutes face-to-face with the patient of which greater than 50% of the visit was spent in counseling and coordination of care as detailed above.    Patient expressed understanding and was in agreement with this plan. He also understands that  He can call clinic at any time with any questions, concerns, or complaints.    Lloyd Huger, MD   11/28/2017 9:18 AM

## 2017-11-25 DIAGNOSIS — E668 Other obesity: Secondary | ICD-10-CM | POA: Insufficient documentation

## 2017-11-26 ENCOUNTER — Inpatient Hospital Stay (HOSPITAL_BASED_OUTPATIENT_CLINIC_OR_DEPARTMENT_OTHER): Payer: BLUE CROSS/BLUE SHIELD | Admitting: Oncology

## 2017-11-26 ENCOUNTER — Encounter: Payer: Self-pay | Admitting: Oncology

## 2017-11-26 ENCOUNTER — Inpatient Hospital Stay: Payer: BLUE CROSS/BLUE SHIELD

## 2017-11-26 VITALS — BP 162/77 | HR 70 | Temp 97.5°F | Resp 20 | Wt 363.0 lb

## 2017-11-26 DIAGNOSIS — D696 Thrombocytopenia, unspecified: Secondary | ICD-10-CM

## 2017-11-26 DIAGNOSIS — D693 Immune thrombocytopenic purpura: Secondary | ICD-10-CM

## 2017-11-26 LAB — CBC WITH DIFFERENTIAL/PLATELET
ABS IMMATURE GRANULOCYTES: 0.01 10*3/uL (ref 0.00–0.07)
BASOS PCT: 0 %
Basophils Absolute: 0 10*3/uL (ref 0.0–0.1)
EOS ABS: 0.1 10*3/uL (ref 0.0–0.5)
Eosinophils Relative: 2 %
HCT: 38.8 % — ABNORMAL LOW (ref 39.0–52.0)
Hemoglobin: 12.4 g/dL — ABNORMAL LOW (ref 13.0–17.0)
Immature Granulocytes: 0 %
Lymphocytes Relative: 21 %
Lymphs Abs: 1.3 10*3/uL (ref 0.7–4.0)
MCH: 24.9 pg — ABNORMAL LOW (ref 26.0–34.0)
MCHC: 32 g/dL (ref 30.0–36.0)
MCV: 77.9 fL — ABNORMAL LOW (ref 80.0–100.0)
MONO ABS: 0.3 10*3/uL (ref 0.1–1.0)
Monocytes Relative: 5 %
NEUTROS ABS: 4.4 10*3/uL (ref 1.7–7.7)
Neutrophils Relative %: 72 %
PLATELETS: 101 10*3/uL — AB (ref 150–400)
RBC: 4.98 MIL/uL (ref 4.22–5.81)
RDW: 14.4 % (ref 11.5–15.5)
WBC: 6.1 10*3/uL (ref 4.0–10.5)
nRBC: 0 % (ref 0.0–0.2)

## 2017-11-26 NOTE — Progress Notes (Signed)
Patient denies any concerns today.  

## 2017-12-24 ENCOUNTER — Inpatient Hospital Stay: Payer: BLUE CROSS/BLUE SHIELD | Attending: Oncology

## 2017-12-24 DIAGNOSIS — D693 Immune thrombocytopenic purpura: Secondary | ICD-10-CM | POA: Diagnosis present

## 2017-12-24 DIAGNOSIS — D696 Thrombocytopenia, unspecified: Secondary | ICD-10-CM

## 2017-12-24 LAB — CBC WITH DIFFERENTIAL/PLATELET
Abs Immature Granulocytes: 0.02 10*3/uL (ref 0.00–0.07)
Basophils Absolute: 0 10*3/uL (ref 0.0–0.1)
Basophils Relative: 0 %
EOS ABS: 0.1 10*3/uL (ref 0.0–0.5)
EOS PCT: 1 %
HCT: 41.1 % (ref 39.0–52.0)
Hemoglobin: 13.1 g/dL (ref 13.0–17.0)
Immature Granulocytes: 0 %
LYMPHS ABS: 1.7 10*3/uL (ref 0.7–4.0)
Lymphocytes Relative: 28 %
MCH: 24.9 pg — AB (ref 26.0–34.0)
MCHC: 31.9 g/dL (ref 30.0–36.0)
MCV: 78.1 fL — ABNORMAL LOW (ref 80.0–100.0)
MONO ABS: 0.4 10*3/uL (ref 0.1–1.0)
Monocytes Relative: 6 %
NRBC: 0 % (ref 0.0–0.2)
Neutro Abs: 4 10*3/uL (ref 1.7–7.7)
Neutrophils Relative %: 65 %
PLATELETS: 90 10*3/uL — AB (ref 150–400)
RBC: 5.26 MIL/uL (ref 4.22–5.81)
RDW: 14.7 % (ref 11.5–15.5)
WBC: 6.2 10*3/uL (ref 4.0–10.5)

## 2018-01-17 NOTE — Progress Notes (Signed)
[place]  Telephone:(336) [phone] Fax:(336) [phone]  ID: LAVON BOTHWELL OB: 12/10/1990  MR#: [id number]  WGY#:[id number]  Patient Care Team: [facility] [place] as PCP - General (General Practice)  CHIEF COMPLAINT: ITP.  INTERVAL HISTORY: Patient returns to clinic today for repeat laboratory work and further evaluation.  He continues to feel well and remains asymptomatic. He denies any easy bleeding or bruising. He has no neurologic complaints. He denies any recent fevers or illnesses. He has a good appetite and denies weight loss. He has no chest pain or shortness of breath. He denies any nausea, vomiting, constipation, or diarrhea. He has no urinary complaints.  Patient feels at his baseline offers no specific complaints today.  REVIEW OF SYSTEMS:   Review of Systems  Constitutional: Negative.  Negative for fever, malaise/fatigue and weight loss.  HENT: Negative.  Negative for nosebleeds.   Respiratory: Negative.  Negative for cough, hemoptysis and shortness of breath.   Cardiovascular: Negative.  Negative for chest pain and leg swelling.  Gastrointestinal: Negative.  Negative for abdominal pain, blood in stool and melena.  Genitourinary: Negative.  Negative for hematuria.  Musculoskeletal: Negative.   Skin: Negative.  Negative for rash.  Neurological: Negative.  Negative for sensory change, focal weakness and weakness.  Endo/Heme/Allergies: Does not bruise/bleed easily.  Psychiatric/Behavioral: Negative.  The patient is not nervous/anxious.     As per HPI. Otherwise, a complete review of systems is negative.  PAST MEDICAL HISTORY: Past Medical History:  Diagnosis Date  . Cellulitis    left leg  . Idiopathic thrombocytopenic purpura (ITP) (HCC)     PAST SURGICAL HISTORY: Past Surgical History:  Procedure Laterality Date  . PORTA CATH INSERTION N/A 09/20/2017   Procedure: PORTA CATH INSERTION;  Surgeon: Algernon Huxley, MD;   Location: [facility] [place] [facility];  Service: Cardiovascular;  Laterality: N/A;  . TONSILLECTOMY      FAMILY HISTORY: Family History  Problem Relation Age of Onset  . Hypertension Mother   . Diabetes Mother        ADVANCED DIRECTIVES:    HEALTH MAINTENANCE: Social History   Tobacco Use  . Smoking status: Never Smoker  . Smokeless tobacco: Never Used  Substance Use Topics  . Alcohol use: No  . Drug use: No     Colonoscopy:  PAP:  Bone density:  Lipid panel:  Allergies  Allergen Reactions  . Augmentin [Amoxicillin-Pot Clavulanate] Hives  . Sulfamethoxazole-Trimethoprim     Dehydration, confusion , high BP    Current Outpatient Medications  Medication Sig Dispense Refill  . lidocaine-prilocaine (EMLA) cream Apply 1 application topically as needed. Apply to port 1-2 hours prior to chemotherapy appointment. Cover with plastic wrap. 30 g 0   No current facility-administered medications for this visit.    Facility-Administered Medications Ordered in Other Visits  Medication Dose Route Frequency Provider Last Rate Last Dose  . famotidine (PEPCID) IVPB 20 mg premix  20 mg Intravenous Q12H Lloyd Huger, MD   Stopped at 10/01/17 1001  . sodium chloride flush (NS) 0.9 % injection 10 mL  10 mL Intracatheter PRN Lloyd Huger, MD   10 mL at 10/01/17 0855    OBJECTIVE: Vitals:   01/21/18 1504  BP: (!) 166/79  Pulse: 64  Temp: 97.8 F (36.6 C)     Body mass index is 59 kg/m.    ECOG FS:0 - Asymptomatic  General: Well-developed, well-nourished, no acute distress. Eyes: Pink conjunctiva, anicteric sclera. HEENT: Normocephalic, moist  mucous membranes, clear oropharnyx. Lungs: Clear to auscultation bilaterally. Heart: Regular rate and rhythm. No rubs, murmurs, or gallops. Abdomen: Soft, nontender, nondistended. No organomegaly noted, normoactive bowel sounds. Musculoskeletal: No edema, cyanosis, or clubbing. Neuro: Alert, answering all questions appropriately.  Cranial nerves grossly intact. Skin: No rashes or petechiae noted. Psych: Normal affect.  LAB RESULTS:  Lab Results  Component Value Date   NA 138 07/01/2017   K 3.9 07/01/2017   CL 107 07/01/2017   CO2 27 07/01/2017   GLUCOSE 104 (H) 07/01/2017   BUN 15 07/01/2017   CREATININE 0.73 07/01/2017   CALCIUM 8.8 (L) 07/01/2017   PROT 8.1 07/01/2017   ALBUMIN 4.0 07/01/2017   AST 21 07/01/2017   ALT 18 07/01/2017   ALKPHOS 65 07/01/2017   BILITOT 0.8 07/01/2017   GFRNONAA >60 07/01/2017   GFRAA >60 07/01/2017    Lab Results  Component Value Date   WBC 6.0 01/21/2018   NEUTROABS 4.0 01/21/2018   HGB 12.4 (L) 01/21/2018   HCT 38.9 (L) 01/21/2018   MCV 78.7 (L) 01/21/2018   PLT 101 (L) 01/21/2018   Lab Results  Component Value Date   IRON 50 09/09/2015   TIBC 320 09/09/2015   IRONPCTSAT 16 (L) 09/09/2015   Lab Results  Component Value Date   FERRITIN 75 09/09/2015     STUDIES: No results found.  ASSESSMENT: ITP.  PLAN:     1.  ITP: Previously, patient received 5 days of 100 mg prednisone with an excellent response to his platelets increasing to greater than 200.  Unfortunately, response was not durable. 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  Splenic ultrasound did not reveal any evidence of splenomegaly.  Bone marrow biopsy was not completed secondary to technical difficulties with patient's body habitus.  Patient completed cycle 4 of 4 of single agent weekly Rituxan on October 08, 2017.  His platelets remain decreased, but stable at 101.  No intervention is needed at this time.  Return to clinic in 6 weeks for repeat laboratory work only and then in 3 months for repeat laboratory work and further evaluation.   2.  Poor venous access: Patient now has a port.  Continue port flushes every 6 weeks.  I spent a total of 20 minutes face-to-face with the patient of which greater than 50% of the visit was spent in counseling and  coordination of care as detailed above.  Patient expressed understanding and was in agreement with this plan. He also understands that He can call clinic at any time with any questions, concerns, or complaints.    Lloyd Huger, MD   01/22/2018 9:22 AM

## 2018-01-21 ENCOUNTER — Inpatient Hospital Stay: Payer: BLUE CROSS/BLUE SHIELD

## 2018-01-21 ENCOUNTER — Inpatient Hospital Stay: Payer: BLUE CROSS/BLUE SHIELD | Attending: Oncology | Admitting: Oncology

## 2018-01-21 ENCOUNTER — Other Ambulatory Visit: Payer: Self-pay

## 2018-01-21 VITALS — BP 166/79 | HR 64 | Temp 97.8°F | Ht 66.0 in | Wt 365.5 lb

## 2018-01-21 DIAGNOSIS — D693 Immune thrombocytopenic purpura: Secondary | ICD-10-CM | POA: Insufficient documentation

## 2018-01-21 DIAGNOSIS — D696 Thrombocytopenia, unspecified: Secondary | ICD-10-CM

## 2018-01-21 LAB — CBC WITH DIFFERENTIAL/PLATELET
ABS IMMATURE GRANULOCYTES: 0.01 10*3/uL (ref 0.00–0.07)
BASOS ABS: 0 10*3/uL (ref 0.0–0.1)
Basophils Relative: 0 %
EOS ABS: 0.1 10*3/uL (ref 0.0–0.5)
Eosinophils Relative: 1 %
HCT: 38.9 % — ABNORMAL LOW (ref 39.0–52.0)
HEMOGLOBIN: 12.4 g/dL — AB (ref 13.0–17.0)
Immature Granulocytes: 0 %
LYMPHS ABS: 1.6 10*3/uL (ref 0.7–4.0)
LYMPHS PCT: 26 %
MCH: 25.1 pg — ABNORMAL LOW (ref 26.0–34.0)
MCHC: 31.9 g/dL (ref 30.0–36.0)
MCV: 78.7 fL — ABNORMAL LOW (ref 80.0–100.0)
Monocytes Absolute: 0.4 10*3/uL (ref 0.1–1.0)
Monocytes Relative: 6 %
NEUTROS PCT: 67 %
Neutro Abs: 4 10*3/uL (ref 1.7–7.7)
PLATELETS: 101 10*3/uL — AB (ref 150–400)
RBC: 4.94 MIL/uL (ref 4.22–5.81)
RDW: 14.7 % (ref 11.5–15.5)
WBC: 6 10*3/uL (ref 4.0–10.5)
nRBC: 0 % (ref 0.0–0.2)

## 2018-01-21 NOTE — Progress Notes (Signed)
Patient is here today thrombocytopenia. Patient stated that he had been doing well.

## 2018-03-04 ENCOUNTER — Inpatient Hospital Stay: Payer: BC Managed Care – PPO | Attending: Oncology

## 2018-03-04 ENCOUNTER — Other Ambulatory Visit: Payer: BLUE CROSS/BLUE SHIELD

## 2018-03-04 DIAGNOSIS — D693 Immune thrombocytopenic purpura: Secondary | ICD-10-CM | POA: Diagnosis present

## 2018-03-04 DIAGNOSIS — D696 Thrombocytopenia, unspecified: Secondary | ICD-10-CM

## 2018-03-04 LAB — CBC WITH DIFFERENTIAL/PLATELET
ABS IMMATURE GRANULOCYTES: 0 10*3/uL (ref 0.00–0.07)
Basophils Absolute: 0 10*3/uL (ref 0.0–0.1)
Basophils Relative: 0 %
Eosinophils Absolute: 0.1 10*3/uL (ref 0.0–0.5)
Eosinophils Relative: 1 %
HCT: 42.3 % (ref 39.0–52.0)
Hemoglobin: 13.1 g/dL (ref 13.0–17.0)
Immature Granulocytes: 0 %
LYMPHS ABS: 1.7 10*3/uL (ref 0.7–4.0)
LYMPHS PCT: 27 %
MCH: 24.4 pg — ABNORMAL LOW (ref 26.0–34.0)
MCHC: 31 g/dL (ref 30.0–36.0)
MCV: 78.9 fL — ABNORMAL LOW (ref 80.0–100.0)
Monocytes Absolute: 0.3 10*3/uL (ref 0.1–1.0)
Monocytes Relative: 5 %
Neutro Abs: 4.2 10*3/uL (ref 1.7–7.7)
Neutrophils Relative %: 67 %
Platelets: 46 10*3/uL — ABNORMAL LOW (ref 150–400)
RBC: 5.36 MIL/uL (ref 4.22–5.81)
RDW: 14.6 % (ref 11.5–15.5)
WBC: 6.3 10*3/uL (ref 4.0–10.5)
nRBC: 0 % (ref 0.0–0.2)

## 2018-06-22 ENCOUNTER — Emergency Department
Admission: EM | Admit: 2018-06-22 | Discharge: 2018-06-22 | Disposition: A | Discharge status: Home / Self Care | Payer: BC Managed Care – PPO | Attending: Emergency Medicine | Admitting: Emergency Medicine

## 2018-06-22 ENCOUNTER — Other Ambulatory Visit: Payer: Self-pay

## 2018-06-22 DIAGNOSIS — M6282 Rhabdomyolysis: Secondary | ICD-10-CM | POA: Insufficient documentation

## 2018-06-22 DIAGNOSIS — D696 Thrombocytopenia, unspecified: Secondary | ICD-10-CM | POA: Diagnosis not present

## 2018-06-22 DIAGNOSIS — M791 Myalgia, unspecified site: Secondary | ICD-10-CM | POA: Diagnosis present

## 2018-06-22 LAB — CK
Total CK: 2654 U/L — ABNORMAL HIGH (ref 49–397)
Total CK: 2519 U/L — ABNORMAL HIGH (ref 49–397)

## 2018-06-22 LAB — CBC WITH DIFFERENTIAL/PLATELET
Abs Immature Granulocytes: 0.01 10*3/uL (ref 0.00–0.07)
Basophils Absolute: 0 10*3/uL (ref 0.0–0.1)
Basophils Relative: 0 %
Eosinophils Absolute: 0.1 10*3/uL (ref 0.0–0.5)
Eosinophils Relative: 1 %
HCT: 41.3 % (ref 39.0–52.0)
Hemoglobin: 13.2 g/dL (ref 13.0–17.0)
Immature Granulocytes: 0 %
Lymphocytes Relative: 26 %
Lymphs Abs: 1.5 10*3/uL (ref 0.7–4.0)
MCH: 24.8 pg — ABNORMAL LOW (ref 26.0–34.0)
MCHC: 32 g/dL (ref 30.0–36.0)
MCV: 77.6 fL — ABNORMAL LOW (ref 80.0–100.0)
Monocytes Absolute: 0.3 10*3/uL (ref 0.1–1.0)
Monocytes Relative: 4 %
Neutro Abs: 4 10*3/uL (ref 1.7–7.7)
Neutrophils Relative %: 69 %
Platelets: 28 10*3/uL — CL (ref 150–400)
RBC: 5.32 MIL/uL (ref 4.22–5.81)
RDW: 13.9 % (ref 11.5–15.5)
Smear Review: DECREASED
WBC: 5.8 10*3/uL (ref 4.0–10.5)
nRBC: 0 % (ref 0.0–0.2)

## 2018-06-22 LAB — COMPREHENSIVE METABOLIC PANEL
ALT: 31 U/L (ref 0–44)
AST: 38 U/L (ref 15–41)
Albumin: 3.6 g/dL (ref 3.5–5.0)
Alkaline Phosphatase: 78 U/L (ref 38–126)
Anion gap: 7 (ref 5–15)
BUN: 12 mg/dL (ref 6–20)
CO2: 26 mmol/L (ref 22–32)
Calcium: 8.3 mg/dL — ABNORMAL LOW (ref 8.9–10.3)
Chloride: 109 mmol/L (ref 98–111)
Creatinine, Ser: 0.52 mg/dL — ABNORMAL LOW (ref 0.61–1.24)
GFR calc Af Amer: >60 mL/min (ref >60)
GFR calc non Af Amer: >60 mL/min (ref >60)
Glucose, Bld: 104 mg/dL — ABNORMAL HIGH (ref 70–99)
Potassium: 3.8 mmol/L (ref 3.5–5.1)
Sodium: 142 mmol/L (ref 135–145)
Total Bilirubin: 0.6 mg/dL (ref 0.3–1.2)
Total Protein: 6.8 g/dL (ref 6.5–8.1)

## 2018-06-22 MED ORDER — SODIUM CHLORIDE 0.9 % IV SOLN
1000.0000 mL | Freq: Once | INTRAVENOUS | Status: AC
  Administered 2018-06-22: 1000 mL via INTRAVENOUS

## 2018-06-22 MED ORDER — SODIUM CHLORIDE 0.9 % IV BOLUS
1000.0000 mL | Freq: Once | INTRAVENOUS | Status: AC
  Administered 2018-06-22: 1000 mL via INTRAVENOUS

## 2018-06-22 MED ORDER — HEPARIN SOD (PORK) LOCK FLUSH 100 UNIT/ML IV SOLN
500.0000 [IU] | Freq: Once | INTRAVENOUS | Status: AC
  Administered 2018-06-22: 500 [IU] via INTRAVENOUS
  Filled 2018-06-22: qty 5

## 2018-06-22 MED ORDER — SODIUM CHLORIDE 0.9 % IV SOLN: 1000.0000 mL | Freq: Once | INTRAVENOUS | Status: DC

## 2018-06-22 NOTE — ED Provider Notes (Signed)
-----------------------------------------   6:18 PM on 06/22/2018 -----------------------------------------   Blood pressure 125/71, pulse 61, temperature 98.3 F (36.8 C), temperature source Oral, resp. rate 18, height 5\' 6"  (1.676 m), weight (!) 163.3 kg, SpO2 100 %.  Assuming care from Dr. Cyril Loosen of Samuel Rojas is a 28 y.o. male with a chief complaint of Generalized swelling .    Please refer to H&P by previous MD for further details.  The current plan of care is to dc after repeat bolus  Patient finished his repeat bolus and continues to feel markedly improved.  Again offered admission and patient declined.  Will discharge home per plan left by Dr. Roselind Messier with close follow-up with.  Strict return precautions were discussed again.        Samuel Rojas, [place], MD 06/22/18 [phone]

## 2018-06-22 NOTE — ED Notes (Signed)
Pt did not advise nurse that he had a port - when asked where they usually obtained IV he stated in his right hand

## 2018-06-22 NOTE — ED Notes (Signed)
IV team at bedside and accessed port to get blood- informed that port had not been being flushed with heparin and flushes slow

## 2018-06-22 NOTE — ED Notes (Signed)
Rate changed to 532ml/hr - pt port will not tolerate faster rate - Dr Don Perking notified

## 2018-06-22 NOTE — ED Notes (Signed)
Dr Cyril Loosen notified of platelet count of 28- verbal order for 1L fluid bolus given

## 2018-06-22 NOTE — ED Notes (Signed)
Lab called critical platelet 28- will notify Dr Cyril Loosen

## 2018-06-22 NOTE — ED Notes (Addendum)
Pt reports pain that feels like tightness similar to a "swollen finger" when he walks in his thighs and chest that started Monday and has gotten better- noticed yellowing in his eyes as well- denies abdominal pain n/v- pt reports he still sees his hematologist

## 2018-06-22 NOTE — ED Notes (Signed)
IV attempted by Kathlen Mody RN without success - this nurse sees no access and used vein finder without success - will place IV team consult

## 2018-06-22 NOTE — ED Notes (Signed)
Per IV team the pt's port flushes slow- placed pt fluids on a pump at 131ml/hr to prevent problems with the port and instructed pt to call out if port has pain, swelling, or any other problems

## 2018-06-22 NOTE — ED Triage Notes (Signed)
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

## 2018-06-22 NOTE — ED Notes (Signed)
Per Dr Don Perking to finish bolus and then let her know so pt could be discharged - 2nd bolus not to be given

## 2018-06-22 NOTE — ED Provider Notes (Signed)
[facility] Emergency Department Provider Note   ____________________________________________    I have reviewed the triage vital signs and the nursing notes.   HISTORY  Chief Complaint Generalized swelling     HPI Samuel Rojas is a 28 y.o. male who presents with complaints of muscle aches, generalized swelling and yellowish discoloration to eyes.  Patient has a history of ITP although he reports this has not been a major issue for him recently, he does see hematology here in [place] [facility].  He notes over the last 4 to 5 days he has had discomfort in his thighs chest wall and lower abdomen that he describes as a muscle pain.  He notes this was at its worst 3 days ago and has been improving.  He is not take anything for this.  This morning he felt that he might have some mild swelling in his chest wall and he noticed that his eyes looked yellowish so he has presented to the emergency department.    No fevers or chills.  No exposure to COVID-19.  He denies new medications drug use.  No significant exercise  Past Medical History:  Diagnosis Date  . Cellulitis    left leg  . Idiopathic thrombocytopenic purpura (ITP) [facility])     Patient Active Problem List   Diagnosis Date Noted  . Thrombocytopenia (HCC) 09/09/2015  . Cellulitis 01/16/2014    Past Surgical History:  Procedure Laterality Date  . PORTA CATH INSERTION N/A 09/20/2017   Procedure: PORTA CATH INSERTION;  Surgeon: Annice Needy, MD;  Location: [facility] INVASIVE CV LAB;  Service: Cardiovascular;  Laterality: N/A;  . TONSILLECTOMY      Prior to Admission medications   Not on File     Allergies Augmentin [amoxicillin-pot clavulanate] and Sulfamethoxazole-trimethoprim  Family History  Problem Relation Age of Onset  . Hypertension Mother   . Diabetes Mother     Social History Social History   Tobacco Use  . Smoking status: Never Smoker  . Smokeless tobacco: Never Used   Substance Use Topics  . Alcohol use: No  . Drug use: No    Review of Systems  Constitutional: No fever/chills Eyes: No visual changes.  ENT: No sore throat. Cardiovascular: As above Respiratory: Denies shortness of breath. Gastrointestinal: No abdominal pain.  No nausea, no vomiting.   Genitourinary: Negative for dysuria. Musculoskeletal: As above Skin: Negative for rash. Neurological: Negative for headaches or weakness   ____________________________________________   PHYSICAL EXAM:  VITAL SIGNS: ED Triage Vitals  Enc Vitals Group     BP 06/22/18 0859 (!) 182/96     Pulse Rate 06/22/18 0857 74     Resp 06/22/18 0857 18     Temp 06/22/18 0857 98.3 F (36.8 C)     Temp Source 06/22/18 0857 Oral     SpO2 06/22/18 0857 100 %     Weight 06/22/18 0857 (!) 163.3 kg (360 lb)     Height 06/22/18 0857 1.676 m (5\' 6" )     Head Circumference --      Peak Flow --      Pain Score 06/22/18 0857 3     Pain Loc --      Pain Edu? --      Excl. in GC? --     Constitutional: Alert and oriented. No acute distress.  Eyes: Conjunctivae are normal.   Nose: No congestion/rhinnorhea. Mouth/Throat: Mucous membranes are moist.   Neck:  Painless ROM Cardiovascular: Normal rate,  regular rhythm. Grossly normal heart sounds.  Good peripheral circulation. Respiratory: Normal respiratory effort.  No retractions. Lungs CTAB. Gastrointestinal: Soft and nontender. No distention.   Genitourinary: deferred Musculoskeletal: Mild swelling lower extremities, patient has a history of lymphedema no other significant swelling or anasarca noted Neurologic:  Normal speech and language. No gross focal neurologic deficits are appreciated.  Skin:  Skin is warm, dry and intact. No rash noted. Psychiatric: Mood and affect are normal. Speech and behavior are normal.  ____________________________________________   LABS (all labs ordered are listed, but only abnormal results are displayed)  Labs Reviewed   CBC WITH DIFFERENTIAL/PLATELET - Abnormal; Notable for the following components:      Result Value   MCV 77.6 (*)    MCH 24.8 (*)    Platelets 28 (*)    All other components within normal limits  COMPREHENSIVE METABOLIC PANEL - Abnormal; Notable for the following components:   Glucose, Bld 104 (*)    Creatinine, Ser 0.52 (*)    Calcium 8.3 (*)    All other components within normal limits  CK - Abnormal; Notable for the following components:   Total CK 2,654 (*)    All other components within normal limits  CK - Abnormal; Notable for the following components:   Total CK 2,519 (*)    All other components within normal limits   ____________________________________________  EKG  None ____________________________________________  RADIOLOGY  None ____________________________________________   PROCEDURES  Procedure(s) performed: No  Procedures   Critical Care performed: No ____________________________________________   INITIAL IMPRESSION / ASSESSMENT AND PLAN / ED COURSE  Pertinent labs & imaging results that were available during my care of the patient were reviewed by me and considered in my medical decision making (see chart for details).  Patient presents with above complaints.  Most concerning is description of yellow eyes could indicate hepatitis, liver disease pending CMP, CBC, CK for possible myalgias versus rhabdo  Patient CK is elevated, will recheck after IV fluids.  Discussed with hematology regarding his platelets, Dr. Orlie DakinFinnegan recommends outpatient follow-up no specific treatment at this time  Patient CK has decreased slightly after fluids.  Given that patient symptoms are clearly improving over the last several days I discussed with the patient and we agreed to do additional fluids now followed by discharge.  I did offer admission but the patient would like to avoid this if at all possible.  I feel this is reasonable given his normal kidney function normal  LFTs and ability to follow-up closely with hematology.    ____________________________________________   FINAL CLINICAL IMPRESSION(S) / ED DIAGNOSES  Final diagnoses:  Non-traumatic rhabdomyolysis        Note:  This document was prepared using Dragon voice recognition software and may include unintentional dictation errors.   Jene EveryKinner, Caytlin Better, MD 06/22/18 1504

## 2018-06-22 NOTE — ED Notes (Signed)
Dr Don Perking notified of bolus finished

## 2018-06-22 NOTE — ED Notes (Signed)
Called pharmacy to verify dose of heparin for deaccess of port as 500u as 100u/51ml

## 2018-06-30 ENCOUNTER — Other Ambulatory Visit: Payer: Self-pay

## 2018-06-30 ENCOUNTER — Inpatient Hospital Stay: Payer: BC Managed Care – PPO | Attending: Oncology

## 2018-06-30 DIAGNOSIS — M6282 Rhabdomyolysis: Secondary | ICD-10-CM | POA: Insufficient documentation

## 2018-06-30 DIAGNOSIS — D693 Immune thrombocytopenic purpura: Secondary | ICD-10-CM | POA: Insufficient documentation

## 2018-06-30 DIAGNOSIS — D696 Thrombocytopenia, unspecified: Secondary | ICD-10-CM

## 2018-06-30 LAB — CBC WITH DIFFERENTIAL/PLATELET
Abs Immature Granulocytes: 0.01 10*3/uL (ref 0.00–0.07)
Basophils Absolute: 0 10*3/uL (ref 0.0–0.1)
Basophils Relative: 0 %
Eosinophils Absolute: 0.1 10*3/uL (ref 0.0–0.5)
Eosinophils Relative: 1 %
HCT: 42.7 % (ref 39.0–52.0)
Hemoglobin: 13.7 g/dL (ref 13.0–17.0)
Immature Granulocytes: 0 %
Lymphocytes Relative: 25 %
Lymphs Abs: 1.3 10*3/uL (ref 0.7–4.0)
MCH: 25 pg — ABNORMAL LOW (ref 26.0–34.0)
MCHC: 32.1 g/dL (ref 30.0–36.0)
MCV: 77.8 fL — ABNORMAL LOW (ref 80.0–100.0)
Monocytes Absolute: 0.2 10*3/uL (ref 0.1–1.0)
Monocytes Relative: 3 %
Neutro Abs: 3.6 10*3/uL (ref 1.7–7.7)
Neutrophils Relative %: 71 %
Platelets: 31 10*3/uL — ABNORMAL LOW (ref 150–400)
RBC: 5.49 MIL/uL (ref 4.22–5.81)
RDW: 13.8 % (ref 11.5–15.5)
WBC: 5.2 10*3/uL (ref 4.0–10.5)
nRBC: 0 % (ref 0.0–0.2)

## 2018-06-30 NOTE — Progress Notes (Signed)
[place]  [phone]) [phone] Fax:(336) [phone]  ID: JOSHAWA DUBIN OB: 05/31/1990  MR#: [id number]  QQP#:[id number]  Patient Care Team: Center, [facility] as PCP - General (General Practice)  I connected with Jamaar D Ditmer on 07/04/18 at 11:15 AM EDT by video enabled telemedicine visit and verified that I am speaking with the correct person using two identifiers.   I discussed the limitations, risks, security and privacy concerns of performing an evaluation and management service by telemedicine and the availability of in-person appointments. I also discussed with the patient that there may be a patient responsible charge related to this service. The patient expressed understanding and agreed to proceed.   Other persons participating in the visit and their role in the encounter: Patient, MD  Patient's location: Home Provider's location: Clinic  CHIEF COMPLAINT: ITP, nontraumatic rhabdomyolysis.  INTERVAL HISTORY: Patient agreed to video enabled telemedicine visit for ER follow-up and discussion of his laboratory work.  Patient was recently seen in the emergency room for myalgias and found to have significantly elevated CK levels.  Patient symptoms improved with IV fluids and it was determined that he had nontraumatic rhabdomyolysis secondary to dehydration.  He also is noted to have a significantly reduced platelet count.  Currently, patient feels well and is back to his baseline.  He has no further myalgias.  He denies any easy bleeding or bruising. He has no neurologic complaints. He denies any recent fevers or illnesses. He has a good appetite and denies weight loss.  He has no chest pain, shortness of breath, cough, or hemoptysis.  He denies any nausea, vomiting, constipation, or diarrhea. He has no urinary complaints.  Patient offers no further specific complaints today.  REVIEW OF SYSTEMS:   Review of Systems   Constitutional: Negative.  Negative for fever, malaise/fatigue and weight loss.  HENT: Negative.  Negative for nosebleeds.   Respiratory: Negative.  Negative for cough, hemoptysis and shortness of breath.   Cardiovascular: Negative.  Negative for chest pain and leg swelling.  Gastrointestinal: Negative.  Negative for abdominal pain, blood in stool and melena.  Genitourinary: Negative.  Negative for hematuria.  Musculoskeletal: Negative.   Skin: Negative.  Negative for rash.  Neurological: Negative.  Negative for sensory change, focal weakness and weakness.  Endo/Heme/Allergies: Does not bruise/bleed easily.  Psychiatric/Behavioral: Negative.  The patient is not nervous/anxious.     As per HPI. Otherwise, a complete review of systems is negative.  PAST MEDICAL HISTORY: Past Medical History:  Diagnosis Date  . Cellulitis    left leg  . Idiopathic thrombocytopenic purpura (ITP) (HCC)     PAST SURGICAL HISTORY: Past Surgical History:  Procedure Laterality Date  . PORTA CATH INSERTION N/A 09/20/2017   Procedure: PORTA CATH INSERTION;  Surgeon: Algernon Huxley, MD;  Location: [place] CV LAB;  Service: Cardiovascular;  Laterality: N/A;  . TONSILLECTOMY      FAMILY HISTORY: Family History  Problem Relation Age of Onset  . Hypertension Mother   . Diabetes Mother        ADVANCED DIRECTIVES:    HEALTH MAINTENANCE: Social History   Tobacco Use  . Smoking status: Never Smoker  . Smokeless tobacco: Never Used  Substance Use Topics  . Alcohol use: No  . Drug use: No     Colonoscopy:  PAP:  Bone density:  Lipid panel:  Allergies  Allergen Reactions  . Augmentin [Amoxicillin-Pot Clavulanate] Hives  . Sulfamethoxazole-Trimethoprim     Dehydration, confusion ,  high BP    No current outpatient medications on file.   No current facility-administered medications for this visit.    Facility-Administered Medications Ordered in Other Visits  Medication Dose Route  Frequency Provider Last Rate Last Dose  . famotidine (PEPCID) IVPB 20 mg premix  20 mg Intravenous Q12H Lloyd Huger, MD   Stopped at 10/01/17 1001  . sodium chloride flush (NS) 0.9 % injection 10 mL  10 mL Intracatheter PRN Lloyd Huger, MD   10 mL at 10/01/17 0855    OBJECTIVE: There were no vitals filed for this visit.   There is no height or weight on file to calculate BMI.    ECOG FS:0 - Asymptomatic  General: Well-developed, well-nourished, no acute distress. HEENT: Normocephalic. Neuro: Alert, answering all questions appropriately. Cranial nerves grossly intact. Skin: No rashes or petechiae noted. Psych: Normal affect.  LAB RESULTS:  Lab Results  Component Value Date   NA 142 06/22/2018   K 3.8 06/22/2018   CL 109 06/22/2018   CO2 26 06/22/2018   GLUCOSE 104 (H) 06/22/2018   BUN 12 06/22/2018   CREATININE 0.52 (L) 06/22/2018   CALCIUM 8.3 (L) 06/22/2018   PROT 6.8 06/22/2018   ALBUMIN 3.6 06/22/2018   AST 38 06/22/2018   ALT 31 06/22/2018   ALKPHOS 78 06/22/2018   BILITOT 0.6 06/22/2018   GFRNONAA >60 06/22/2018   GFRAA >60 06/22/2018    Lab Results  Component Value Date   WBC 5.2 06/30/2018   NEUTROABS 3.6 06/30/2018   HGB 13.7 06/30/2018   HCT 42.7 06/30/2018   MCV 77.8 (L) 06/30/2018   PLT 31 (L) 06/30/2018   Lab Results  Component Value Date   IRON 50 09/09/2015   TIBC 320 09/09/2015   IRONPCTSAT 16 (L) 09/09/2015   Lab Results  Component Value Date   FERRITIN 75 09/09/2015     STUDIES: No results found.  ASSESSMENT: ITP, nontraumatic rhabdomyolysis.  PLAN:     1.  ITP: Previously, patient received 5 days of 100 mg prednisone with an excellent response to his platelets increasing to greater than 200.  Unfortunately, response was not durable. 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  Splenic ultrasound did not reveal any evidence of splenomegaly.  Bone marrow biopsy was not completed  secondary to technical difficulties with patient's body habitus.  Patient completed cycle 4 of 4 of single agent weekly Rituxan on October 08, 2017.  Patient's platelet count has trended down again and is now 31.  He may require retreatment with Rituxan in the near future.  Return to clinic in 4 weeks with repeat laboratory work followed by video visit for further evaluation and treatment planning if necessary.     2.  Poor venous access: Patient now has a port.  Continue port flushes every 6 weeks. 3.  Nontraumatic rhabdomyolysis: Improved with IV fluids therefore thought secondary to dehydration.  No intervention needed.  Repeat CK levels with next laboratory draw.  I provided 25 minutes of face-to-face video visit time during this encounter, and > 50% was spent counseling as documented under my assessment & plan.   Patient expressed understanding and was in agreement with this plan. He also understands that He can call clinic at any time with any questions, concerns, or complaints.    Lloyd Huger, MD   07/04/2018 8:48 AM

## 2018-07-01 ENCOUNTER — Encounter: Payer: Self-pay | Admitting: Oncology

## 2018-07-01 ENCOUNTER — Inpatient Hospital Stay (HOSPITAL_BASED_OUTPATIENT_CLINIC_OR_DEPARTMENT_OTHER): Payer: BC Managed Care – PPO | Admitting: Oncology

## 2018-07-01 DIAGNOSIS — D696 Thrombocytopenia, unspecified: Secondary | ICD-10-CM | POA: Diagnosis not present

## 2018-07-01 NOTE — Progress Notes (Signed)
Patient scheduled for doximity visit today for follow up. Patient denies concerns today.

## 2018-07-22 ENCOUNTER — Other Ambulatory Visit: Payer: BLUE CROSS/BLUE SHIELD

## 2018-07-22 ENCOUNTER — Ambulatory Visit: Payer: BLUE CROSS/BLUE SHIELD | Admitting: Oncology

## 2018-07-30 NOTE — Progress Notes (Signed)
[place]  Telephone:(336) [phone] Fax:(336) [phone]  ID: Samuel Rojas OB: 1990-07-06  MR#: [id number]  CWC#:[id number]  Patient Care Team: Center, [facility] as PCP - General (General Practice)  I connected with Samuel Rojas on 08/06/18 at  2:00 PM EDT by video enabled telemedicine visit and verified that I am speaking with the correct person using two identifiers.   I discussed the limitations, risks, security and privacy concerns of performing an evaluation and management service by telemedicine and the availability of in-person appointments. I also discussed with the patient that there may be a patient responsible charge related to this service. The patient expressed understanding and agreed to proceed.   Other persons participating in the visit and their role in the encounter: Patient, MD  Patient's location: Home Provider's location: Clinic  CHIEF COMPLAINT: ITP.  INTERVAL HISTORY: Patient agreed to video enabled telemedicine visit for further evaluation and treatment planning.  He currently feels well and is asymptomatic.  He denies any easy bleeding or bruising. He has no neurologic complaints. He denies any recent fevers or illnesses. He has a good appetite and denies weight loss.  He has no chest pain, shortness of breath, cough, or hemoptysis.  He denies any nausea, vomiting, constipation, or diarrhea. He has no urinary complaints.  Patient feels at his baseline and offers no specific complaints today.  REVIEW OF SYSTEMS:   Review of Systems  Constitutional: Negative.  Negative for fever, malaise/fatigue and weight loss.  HENT: Negative.  Negative for nosebleeds.   Respiratory: Negative.  Negative for cough, hemoptysis and shortness of breath.   Cardiovascular: Negative.  Negative for chest pain and leg swelling.  Gastrointestinal: Negative.  Negative for abdominal pain, blood in stool and melena.  Genitourinary:  Negative.  Negative for hematuria.  Musculoskeletal: Negative.   Skin: Negative.  Negative for rash.  Neurological: Negative.  Negative for sensory change, focal weakness and weakness.  Endo/Heme/Allergies: Does not bruise/bleed easily.  Psychiatric/Behavioral: Negative.  The patient is not nervous/anxious.     As per HPI. Otherwise, a complete review of systems is negative.  PAST MEDICAL HISTORY: Past Medical History:  Diagnosis Date  . Cellulitis    left leg  . Idiopathic thrombocytopenic purpura (ITP) (HCC)     PAST SURGICAL HISTORY: Past Surgical History:  Procedure Laterality Date  . PORTA CATH INSERTION N/A 09/20/2017   Procedure: PORTA CATH INSERTION;  Surgeon: Algernon Huxley, MD;  Location: [place] CV LAB;  Service: Cardiovascular;  Laterality: N/A;  . TONSILLECTOMY      FAMILY HISTORY: Family History  Problem Relation Age of Onset  . Hypertension Mother   . Diabetes Mother        ADVANCED DIRECTIVES:    HEALTH MAINTENANCE: Social History   Tobacco Use  . Smoking status: Never Smoker  . Smokeless tobacco: Never Used  Substance Use Topics  . Alcohol use: No  . Drug use: No     Colonoscopy:  PAP:  Bone density:  Lipid panel:  Allergies  Allergen Reactions  . Augmentin [Amoxicillin-Pot Clavulanate] Hives  . Sulfamethoxazole-Trimethoprim     Dehydration, confusion , high BP    Current Outpatient Medications  Medication Sig Dispense Refill  . predniSONE (DELTASONE) 50 MG tablet Take 100 mg ( 2 tablets) daily for 7 days 14 tablet 0   No current facility-administered medications for this visit.    Facility-Administered Medications Ordered in Other Visits  Medication Dose Route Frequency Provider Last Rate  Last Dose  . famotidine (PEPCID) IVPB 20 mg premix  20 mg Intravenous Q12H Lloyd Huger, MD   Stopped at 10/01/17 1001  . sodium chloride flush (NS) 0.9 % injection 10 mL  10 mL Intracatheter PRN Lloyd Huger, MD   10 mL at  10/01/17 0855    OBJECTIVE: There were no vitals filed for this visit.   There is no height or weight on file to calculate BMI.    ECOG FS:0 - Asymptomatic  General: Well-developed, well-nourished, no acute distress. HEENT: Normocephalic. Neuro: Alert, answering all questions appropriately. Cranial nerves grossly intact. Skin: No rashes or petechiae noted. Psych: Normal affect.  LAB RESULTS:  Lab Results  Component Value Date   NA 138 08/02/2018   K 3.9 08/02/2018   CL 105 08/02/2018   CO2 25 08/02/2018   GLUCOSE 100 (H) 08/02/2018   BUN 13 08/02/2018   CREATININE 0.77 08/02/2018   CALCIUM 8.5 (L) 08/02/2018   PROT 7.1 08/02/2018   ALBUMIN 3.8 08/02/2018   AST 23 08/02/2018   ALT 36 08/02/2018   ALKPHOS 76 08/02/2018   BILITOT 0.6 08/02/2018   GFRNONAA >60 08/02/2018   GFRAA >60 08/02/2018    Lab Results  Component Value Date   WBC 4.5 08/02/2018   NEUTROABS 2.8 08/02/2018   HGB 12.9 (L) 08/02/2018   HCT 40.3 08/02/2018   MCV 77.8 (L) 08/02/2018   PLT 13 (LL) 08/02/2018   Lab Results  Component Value Date   IRON 50 09/09/2015   TIBC 320 09/09/2015   IRONPCTSAT 16 (L) 09/09/2015   Lab Results  Component Value Date   FERRITIN 75 09/09/2015     STUDIES: No results found.  ASSESSMENT: ITP.  PLAN:     1.  ITP: Previously, patient received 5 days of 100 mg prednisone with an excellent response to his platelets increasing to greater than 200.  Unfortunately, response was not durable. 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  Splenic ultrasound did not reveal any evidence of splenomegaly.  Bone marrow biopsy was not completed secondary to technical difficulties with patient's body habitus.  Patient completed cycle 4 of 4 of single agent weekly Rituxan on October 08, 2017.  Patient's platelet count is significantly reduced down to 13.  He was initiated on prednisone several days ago.  Given that prednisone treatment is  not durable as above, patient will return to clinic on August 19, 2018 initiate cycle 1 of 4 weekly Rituxan.   2.  Poor venous access: Patient now has a port.  Continue port flushes every 6 weeks. 3.  Nontraumatic rhabdomyolysis: Resolved.  CK levels are within normal limits.  I provided 25 minutes of face-to-face video visit time during this encounter, and > 50% was spent counseling as documented under my assessment & plan.   Patient expressed understanding and was in agreement with this plan. He also understands that He can call clinic at any time with any questions, concerns, or complaints.    Lloyd Huger, MD   08/06/2018 7:17 AM

## 2018-08-01 ENCOUNTER — Other Ambulatory Visit: Payer: Self-pay

## 2018-08-02 ENCOUNTER — Other Ambulatory Visit: Payer: Self-pay | Admitting: *Deleted

## 2018-08-02 ENCOUNTER — Other Ambulatory Visit: Payer: Self-pay

## 2018-08-02 ENCOUNTER — Inpatient Hospital Stay: Payer: BC Managed Care – PPO | Attending: Oncology | Admitting: *Deleted

## 2018-08-02 DIAGNOSIS — D693 Immune thrombocytopenic purpura: Secondary | ICD-10-CM | POA: Insufficient documentation

## 2018-08-02 DIAGNOSIS — Z95828 Presence of other vascular implants and grafts: Secondary | ICD-10-CM

## 2018-08-02 DIAGNOSIS — D696 Thrombocytopenia, unspecified: Secondary | ICD-10-CM

## 2018-08-02 DIAGNOSIS — Z452 Encounter for adjustment and management of vascular access device: Secondary | ICD-10-CM | POA: Diagnosis not present

## 2018-08-02 LAB — COMPREHENSIVE METABOLIC PANEL
ALT: 36 U/L (ref 0–44)
AST: 23 U/L (ref 15–41)
Albumin: 3.8 g/dL (ref 3.5–5.0)
Alkaline Phosphatase: 76 U/L (ref 38–126)
Anion gap: 8 (ref 5–15)
BUN: 13 mg/dL (ref 6–20)
CO2: 25 mmol/L (ref 22–32)
Calcium: 8.5 mg/dL — ABNORMAL LOW (ref 8.9–10.3)
Chloride: 105 mmol/L (ref 98–111)
Creatinine, Ser: 0.77 mg/dL (ref 0.61–1.24)
GFR calc Af Amer: >60 mL/min (ref >60)
GFR calc non Af Amer: >60 mL/min (ref >60)
Glucose, Bld: 100 mg/dL — ABNORMAL HIGH (ref 70–99)
Potassium: 3.9 mmol/L (ref 3.5–5.1)
Sodium: 138 mmol/L (ref 135–145)
Total Bilirubin: 0.6 mg/dL (ref 0.3–1.2)
Total Protein: 7.1 g/dL (ref 6.5–8.1)

## 2018-08-02 LAB — CBC WITH DIFFERENTIAL/PLATELET
Abs Immature Granulocytes: 0.01 10*3/uL (ref 0.00–0.07)
Basophils Absolute: 0 10*3/uL (ref 0.0–0.1)
Basophils Relative: 0 %
Eosinophils Absolute: 0.1 10*3/uL (ref 0.0–0.5)
Eosinophils Relative: 1 %
HCT: 40.3 % (ref 39.0–52.0)
Hemoglobin: 12.9 g/dL — ABNORMAL LOW (ref 13.0–17.0)
Immature Granulocytes: 0 %
Lymphocytes Relative: 30 %
Lymphs Abs: 1.3 10*3/uL (ref 0.7–4.0)
MCH: 24.9 pg — ABNORMAL LOW (ref 26.0–34.0)
MCHC: 32 g/dL (ref 30.0–36.0)
MCV: 77.8 fL — ABNORMAL LOW (ref 80.0–100.0)
Monocytes Absolute: 0.3 10*3/uL (ref 0.1–1.0)
Monocytes Relative: 6 %
Neutro Abs: 2.8 10*3/uL (ref 1.7–7.7)
Neutrophils Relative %: 63 %
Platelets: 13 10*3/uL — CL (ref 150–400)
RBC: 5.18 MIL/uL (ref 4.22–5.81)
RDW: 13.6 % (ref 11.5–15.5)
Smear Review: NORMAL
WBC: 4.5 10*3/uL (ref 4.0–10.5)
nRBC: 0 % (ref 0.0–0.2)

## 2018-08-02 LAB — CK: Total CK: 227 U/L (ref 49–397)

## 2018-08-02 MED ORDER — SODIUM CHLORIDE 0.9% FLUSH
10.0000 mL | Freq: Once | INTRAVENOUS | Status: AC
  Administered 2018-08-02: 10 mL via INTRAVENOUS
  Filled 2018-08-02: qty 10

## 2018-08-02 MED ORDER — HEPARIN SOD (PORK) LOCK FLUSH 100 UNIT/ML IV SOLN
500.0000 [IU] | Freq: Once | INTRAVENOUS | Status: AC
  Administered 2018-08-02: 500 [IU] via INTRAVENOUS

## 2018-08-02 MED ORDER — PREDNISONE 50 MG PO TABS: ORAL_TABLET | ORAL | 0 refills | Status: DC

## 2018-08-04 ENCOUNTER — Encounter: Payer: Self-pay | Admitting: Oncology

## 2018-08-04 ENCOUNTER — Inpatient Hospital Stay (HOSPITAL_BASED_OUTPATIENT_CLINIC_OR_DEPARTMENT_OTHER): Payer: BC Managed Care – PPO | Admitting: Oncology

## 2018-08-04 ENCOUNTER — Other Ambulatory Visit: Payer: Self-pay

## 2018-08-04 DIAGNOSIS — D693 Immune thrombocytopenic purpura: Secondary | ICD-10-CM | POA: Diagnosis not present

## 2018-08-04 DIAGNOSIS — D696 Thrombocytopenia, unspecified: Secondary | ICD-10-CM

## 2018-08-04 NOTE — Progress Notes (Signed)
Patient stated that he had been doing well. 

## 2018-08-13 DIAGNOSIS — Z7189 Other specified counseling: Secondary | ICD-10-CM | POA: Insufficient documentation

## 2018-08-13 NOTE — Progress Notes (Signed)
[place]  Telephone:(336) [phone] Fax:(336) [phone]  ID: AUGUSTE TEBBETTS OB: 27-Nov-1990  MR#: [id number]  DJS#:[id number]  Patient Care Team: [facility], [place] as PCP - General (General Practice)  CHIEF COMPLAINT: ITP.  INTERVAL HISTORY: The clinic today for further evaluation and initiation of cycle 1 of 4 of weekly Rituxan.  He continues to feel well and remains asymptomatic. He denies any easy bleeding or bruising. He has no neurologic complaints. He denies any recent fevers or illnesses. He has a good appetite and denies weight loss.  He has no chest pain, shortness of breath, cough, or hemoptysis.  He denies any nausea, vomiting, constipation, or diarrhea. He has no urinary complaints.  Patient offers no specific complaints today.  REVIEW OF SYSTEMS:   Review of Systems  Constitutional: Negative.  Negative for fever, malaise/fatigue and weight loss.  HENT: Negative.  Negative for nosebleeds.   Respiratory: Negative.  Negative for cough, hemoptysis and shortness of breath.   Cardiovascular: Negative.  Negative for chest pain and leg swelling.  Gastrointestinal: Negative.  Negative for abdominal pain, blood in stool and melena.  Genitourinary: Negative.  Negative for hematuria.  Musculoskeletal: Negative.   Skin: Negative.  Negative for rash.  Neurological: Negative.  Negative for sensory change, focal weakness and weakness.  Endo/Heme/Allergies: Does not bruise/bleed easily.  Psychiatric/Behavioral: Negative.  The patient is not nervous/anxious.     As per HPI. Otherwise, a complete review of systems is negative.  PAST MEDICAL HISTORY: Past Medical History:  Diagnosis Date  . Cellulitis    left leg  . Idiopathic thrombocytopenic purpura (ITP) (HCC)     PAST SURGICAL HISTORY: Past Surgical History:  Procedure Laterality Date  . PORTA CATH INSERTION N/A 09/20/2017   Procedure: PORTA CATH INSERTION;  Surgeon: Algernon Huxley,  MD;  Location: [facility];  Service: Cardiovascular;  Laterality: N/A;  . TONSILLECTOMY      FAMILY HISTORY: Family History  Problem Relation Age of Onset  . Hypertension Mother   . Diabetes Mother        ADVANCED DIRECTIVES:    HEALTH MAINTENANCE: Social History   Tobacco Use  . Smoking status: Never Smoker  . Smokeless tobacco: Never Used  Substance Use Topics  . Alcohol use: No  . Drug use: No     Colonoscopy:  PAP:  Bone density:  Lipid panel:  Allergies  Allergen Reactions  . Augmentin [Amoxicillin-Pot Clavulanate] Hives  . Sulfamethoxazole-Trimethoprim     Dehydration, confusion , high BP    No current outpatient medications on file.   No current facility-administered medications for this visit.    Facility-Administered Medications Ordered in Other Visits  Medication Dose Route Frequency Provider Last Rate Last Dose  . famotidine (PEPCID) IVPB 20 mg premix  20 mg Intravenous Q12H Lloyd Huger, MD   Stopped at 10/01/17 1001  . sodium chloride flush (NS) 0.9 % injection 10 mL  10 mL Intracatheter PRN Lloyd Huger, MD   10 mL at 10/01/17 0855    OBJECTIVE: Vitals:   08/19/18 0934  BP: (!) 145/74  Pulse: 76  Temp: (!) 97.2 F (36.2 C)     Body mass index is 60.17 kg/m.    ECOG FS:0 - Asymptomatic  General: Obese, no acute distress. Eyes: Pink conjunctiva, anicteric sclera. HEENT: Normocephalic, moist mucous membranes. Lungs: Clear to auscultation bilaterally. Heart: Regular rate and rhythm. No rubs, murmurs, or gallops. Abdomen: Soft, nontender, nondistended. No organomegaly noted, normoactive  bowel sounds. Musculoskeletal: No edema, cyanosis, or clubbing. Neuro: Alert, answering all questions appropriately. Cranial nerves grossly intact. Skin: No rashes or petechiae noted. Psych: Normal affect.  LAB RESULTS:  Lab Results  Component Value Date   NA 138 08/02/2018   K 3.9 08/02/2018   CL 105 08/02/2018   CO2 25  08/02/2018   GLUCOSE 100 (H) 08/02/2018   BUN 13 08/02/2018   CREATININE 0.77 08/02/2018   CALCIUM 8.5 (L) 08/02/2018   PROT 7.1 08/02/2018   ALBUMIN 3.8 08/02/2018   AST 23 08/02/2018   ALT 36 08/02/2018   ALKPHOS 76 08/02/2018   BILITOT 0.6 08/02/2018   GFRNONAA >60 08/02/2018   GFRAA >60 08/02/2018    Lab Results  Component Value Date   WBC 6.4 08/19/2018   NEUTROABS 4.4 08/19/2018   HGB 12.7 (L) 08/19/2018   HCT 39.7 08/19/2018   MCV 78.3 (L) 08/19/2018   PLT 13 (LL) 08/19/2018   Lab Results  Component Value Date   IRON 50 09/09/2015   TIBC 320 09/09/2015   IRONPCTSAT 16 (L) 09/09/2015   Lab Results  Component Value Date   FERRITIN 75 09/09/2015     STUDIES: No results found.  ASSESSMENT: ITP.  PLAN:     1.  ITP: Previously, patient received 5 days of 100 mg prednisone with an excellent response to his platelets increasing to greater than 200.  Unfortunately, response was not durable. 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  Splenic ultrasound did not reveal any evidence of splenomegaly.  Bone marrow biopsy was not completed secondary to technical difficulties with patient's body habitus.  Patient completed cycle 4 of 4 of single agent weekly Rituxan on October 08, 2017.  Patient's platelet count remains decreased at 13, but he admits to not completing his prednisone course given to him several weeks ago.  Proceed with cycle 1 of 4 weekly Rituxan.  Patient had mild reaction, therefore will increase premedication of Benadryl to 50 mg IV and Decadron to 20 mg IV.  Return to clinic in 1 week for consideration of cycle 2.   2.  Poor venous access: Patient now has a port.  Continue port flushes every 6 weeks. 3.  Nontraumatic rhabdomyolysis: Resolved.  CK levels are within normal limits.  I spent a total of 30 minutes face-to-face with the patient of which greater than 50% of the visit was spent in counseling and  coordination of care as detailed above.   Patient expressed understanding and was in agreement with this plan. He also understands that He can call clinic at any time with any questions, concerns, or complaints.    Lloyd Huger, MD   08/21/2018 8:50 AM

## 2018-08-19 ENCOUNTER — Inpatient Hospital Stay: Payer: BC Managed Care – PPO

## 2018-08-19 ENCOUNTER — Encounter: Payer: Self-pay | Admitting: Oncology

## 2018-08-19 ENCOUNTER — Inpatient Hospital Stay (HOSPITAL_BASED_OUTPATIENT_CLINIC_OR_DEPARTMENT_OTHER): Payer: BC Managed Care – PPO | Admitting: Oncology

## 2018-08-19 ENCOUNTER — Encounter: Payer: Self-pay | Admitting: Nurse Practitioner

## 2018-08-19 ENCOUNTER — Other Ambulatory Visit: Payer: Self-pay

## 2018-08-19 ENCOUNTER — Inpatient Hospital Stay: Payer: BC Managed Care – PPO | Attending: Oncology

## 2018-08-19 VITALS — BP 112/76 | HR 78 | Temp 97.6°F | Resp 20

## 2018-08-19 VITALS — BP 145/74 | HR 76 | Temp 97.2°F | Ht 66.0 in | Wt 372.8 lb

## 2018-08-19 DIAGNOSIS — D696 Thrombocytopenia, unspecified: Secondary | ICD-10-CM

## 2018-08-19 DIAGNOSIS — D693 Immune thrombocytopenic purpura: Secondary | ICD-10-CM

## 2018-08-19 DIAGNOSIS — Z7189 Other specified counseling: Secondary | ICD-10-CM

## 2018-08-19 LAB — CBC WITH DIFFERENTIAL/PLATELET
Abs Immature Granulocytes: 0.01 10*3/uL (ref 0.00–0.07)
Basophils Absolute: 0 10*3/uL (ref 0.0–0.1)
Basophils Relative: 0 %
Eosinophils Absolute: 0.1 10*3/uL (ref 0.0–0.5)
Eosinophils Relative: 1 %
HCT: 39.7 % (ref 39.0–52.0)
Hemoglobin: 12.7 g/dL — ABNORMAL LOW (ref 13.0–17.0)
Immature Granulocytes: 0 %
Lymphocytes Relative: 26 %
Lymphs Abs: 1.7 10*3/uL (ref 0.7–4.0)
MCH: 25 pg — ABNORMAL LOW (ref 26.0–34.0)
MCHC: 32 g/dL (ref 30.0–36.0)
MCV: 78.3 fL — ABNORMAL LOW (ref 80.0–100.0)
Monocytes Absolute: 0.3 10*3/uL (ref 0.1–1.0)
Monocytes Relative: 5 %
Neutro Abs: 4.4 10*3/uL (ref 1.7–7.7)
Neutrophils Relative %: 68 %
Platelets: 13 10*3/uL — CL (ref 150–400)
RBC: 5.07 MIL/uL (ref 4.22–5.81)
RDW: 13.9 % (ref 11.5–15.5)
Smear Review: DECREASED
WBC: 6.4 10*3/uL (ref 4.0–10.5)
nRBC: 0 % (ref 0.0–0.2)

## 2018-08-19 MED ORDER — FAMOTIDINE IN NACL 20-0.9 MG/50ML-% IV SOLN
20.0000 mg | Freq: Two times a day (BID) | INTRAVENOUS | Status: DC
  Administered 2018-08-19: 11:00:00 20 mg via INTRAVENOUS
  Filled 2018-08-19: qty 50

## 2018-08-19 MED ORDER — SODIUM CHLORIDE 0.9 % IV SOLN
375.0000 mg/m2 | Freq: Once | INTRAVENOUS | Status: AC
  Administered 2018-08-19: 11:00:00 1000 mg via INTRAVENOUS
  Filled 2018-08-19: qty 100

## 2018-08-19 MED ORDER — ACETAMINOPHEN 325 MG PO TABS
650.0000 mg | ORAL_TABLET | Freq: Once | ORAL | Status: AC
  Administered 2018-08-19: 11:00:00 650 mg via ORAL
  Filled 2018-08-19: qty 2

## 2018-08-19 MED ORDER — DIPHENHYDRAMINE HCL 50 MG/ML IJ SOLN
25.0000 mg | Freq: Once | INTRAMUSCULAR | Status: AC
  Administered 2018-08-19: 11:00:00 25 mg via INTRAVENOUS
  Filled 2018-08-19: qty 1

## 2018-08-19 MED ORDER — SODIUM CHLORIDE 0.9 % IV SOLN
Freq: Once | INTRAVENOUS | Status: AC
  Administered 2018-08-19: 10:00:00 via INTRAVENOUS
  Filled 2018-08-19: qty 250

## 2018-08-19 MED ORDER — SODIUM CHLORIDE 0.9% FLUSH
10.0000 mL | INTRAVENOUS | Status: DC | PRN
  Administered 2018-08-19: 09:00:00 10 mL via INTRAVENOUS
  Filled 2018-08-19: qty 10

## 2018-08-19 MED ORDER — DIPHENHYDRAMINE HCL 50 MG/ML IJ SOLN
25.0000 mg | Freq: Once | INTRAMUSCULAR | Status: AC
  Administered 2018-08-19: 13:00:00 25 mg via INTRAVENOUS

## 2018-08-19 MED ORDER — HEPARIN SOD (PORK) LOCK FLUSH 100 UNIT/ML IV SOLN
500.0000 [IU] | Freq: Once | INTRAVENOUS | Status: AC
  Administered 2018-08-19: 17:00:00 500 [IU] via INTRAVENOUS
  Filled 2018-08-19: qty 5

## 2018-08-19 MED ORDER — DEXAMETHASONE SODIUM PHOSPHATE 10 MG/ML IJ SOLN
10.0000 mg | Freq: Once | INTRAMUSCULAR | Status: AC
  Administered 2018-08-19: 11:00:00 10 mg via INTRAVENOUS
  Filled 2018-08-19: qty 1

## 2018-08-19 NOTE — Progress Notes (Signed)
Infusion Reaction Progress Note  Date: 08/19/18  Time: 1:23 PM  Pre-medications given: pepcid 20 mg IV, tylenol 650 mg, decadron 10 mg IV  Medication given: Rituxan  Reaction Type: itching  Hx of reaction: Patient denied and believed itching was secondary to steroids.   Note dated 09/17/2017 reads 'After third bump per protocol of Rituxan pt began complaining of "itching on my head and back", tx stopped and upon assessment found pts back to be red, provider notified. Benadryl given per standard and pt verbalized relief of symptoms. Tx restarted and pt tolerated remainder of tx well with no further s/s.' Review of treatment that day does not show steroids had been administered as pre-med (received benadryl and tylenol).   Reaction medication: benadryl 25 mg IV & 0.9% Sodium Chloride 250 ml  Assessment: symptoms resolved after benadryl IV and did not recur. No evidence of hives or redness of head or back. Vitals stable. Patient feels at baseline. Discussed re-challenge at 50% of administration rate when reaction occurred. Nursing denies any concerns. Patient agreeable with proceeding.   Re-challenge: Patient tolerated reduced rate well without adverse effects. I advised to bump up per protocol which he tolerated well without recurrence of symptoms.   Assessment: Consider holdings steroids as pre-med vs increasing benadryl to 50mg  as pre-med.   Beckey Rutter, DNP, AGNP-C [place] at [facility] [phone] (work cell) [phone] (office)

## 2018-08-19 NOTE — Progress Notes (Signed)
Patient stated that he had been doing well with no complaints. 

## 2018-08-19 NOTE — Progress Notes (Signed)
12:50 - at third bump up of Rituxan, patient complaining of head, ear and back itching.  Stopped Rituxan, called Beckey Rutter, NP, gave 25 mg of benadryl, checked vital signs, increased fluids.  Lauren assessed patient, itching subsided within five minutes of giving additional benadryl.  Per Ander Purpura, give patient 250 ml of fluid, reassess symptoms, if subsided restart Rituxan at half of last dose.  13:27 Reassessed patient, no longer having symptoms, began Rituxan at half of previous dose.  Patient tolerated remainder of RItuxan well and completed treatment.

## 2018-08-26 ENCOUNTER — Inpatient Hospital Stay: Payer: BC Managed Care – PPO

## 2018-08-26 ENCOUNTER — Encounter: Payer: Self-pay | Admitting: Oncology

## 2018-08-26 ENCOUNTER — Inpatient Hospital Stay (HOSPITAL_BASED_OUTPATIENT_CLINIC_OR_DEPARTMENT_OTHER): Payer: BC Managed Care – PPO | Admitting: Oncology

## 2018-08-26 ENCOUNTER — Other Ambulatory Visit: Payer: Self-pay

## 2018-08-26 VITALS — BP 135/78 | HR 78 | Resp 18

## 2018-08-26 DIAGNOSIS — D696 Thrombocytopenia, unspecified: Secondary | ICD-10-CM

## 2018-08-26 DIAGNOSIS — D693 Immune thrombocytopenic purpura: Secondary | ICD-10-CM

## 2018-08-26 LAB — CBC WITH DIFFERENTIAL/PLATELET
Abs Immature Granulocytes: 0.01 10*3/uL (ref 0.00–0.07)
Basophils Absolute: 0 10*3/uL (ref 0.0–0.1)
Basophils Relative: 0 %
Eosinophils Absolute: 0.1 10*3/uL (ref 0.0–0.5)
Eosinophils Relative: 2 %
HCT: 40.9 % (ref 39.0–52.0)
Hemoglobin: 12.8 g/dL — ABNORMAL LOW (ref 13.0–17.0)
Immature Granulocytes: 0 %
Lymphocytes Relative: 23 %
Lymphs Abs: 1.6 10*3/uL (ref 0.7–4.0)
MCH: 24.6 pg — ABNORMAL LOW (ref 26.0–34.0)
MCHC: 31.3 g/dL (ref 30.0–36.0)
MCV: 78.7 fL — ABNORMAL LOW (ref 80.0–100.0)
Monocytes Absolute: 0.3 10*3/uL (ref 0.1–1.0)
Monocytes Relative: 5 %
Neutro Abs: 4.8 10*3/uL (ref 1.7–7.7)
Neutrophils Relative %: 70 %
Platelets: 32 10*3/uL — ABNORMAL LOW (ref 150–400)
RBC: 5.2 MIL/uL (ref 4.22–5.81)
RDW: 14 % (ref 11.5–15.5)
WBC: 6.8 10*3/uL (ref 4.0–10.5)
nRBC: 0 % (ref 0.0–0.2)

## 2018-08-26 MED ORDER — DIPHENHYDRAMINE HCL 50 MG/ML IJ SOLN
50.0000 mg | Freq: Once | INTRAMUSCULAR | Status: AC
  Administered 2018-08-26: 50 mg via INTRAVENOUS
  Filled 2018-08-26: qty 1

## 2018-08-26 MED ORDER — SODIUM CHLORIDE 0.9 % IV SOLN
Freq: Once | INTRAVENOUS | Status: AC
  Administered 2018-08-26: 11:00:00 via INTRAVENOUS
  Filled 2018-08-26: qty 250

## 2018-08-26 MED ORDER — SODIUM CHLORIDE 0.9 % IV SOLN
375.0000 mg/m2 | Freq: Once | INTRAVENOUS | Status: AC
  Administered 2018-08-26: 1000 mg via INTRAVENOUS
  Filled 2018-08-26: qty 100

## 2018-08-26 MED ORDER — HEPARIN SOD (PORK) LOCK FLUSH 100 UNIT/ML IV SOLN
500.0000 [IU] | Freq: Once | INTRAVENOUS | Status: AC | PRN
  Administered 2018-08-26: 500 [IU]
  Filled 2018-08-26: qty 5

## 2018-08-26 MED ORDER — ACETAMINOPHEN 325 MG PO TABS
650.0000 mg | ORAL_TABLET | Freq: Once | ORAL | Status: AC
  Administered 2018-08-26: 650 mg via ORAL
  Filled 2018-08-26: qty 2

## 2018-08-26 MED ORDER — DEXAMETHASONE SODIUM PHOSPHATE 10 MG/ML IJ SOLN: 20.0000 mg | Freq: Once | INTRAMUSCULAR | Status: DC

## 2018-08-26 MED ORDER — FAMOTIDINE IN NACL 20-0.9 MG/50ML-% IV SOLN
20.0000 mg | Freq: Two times a day (BID) | INTRAVENOUS | Status: DC
  Administered 2018-08-26: 20 mg via INTRAVENOUS
  Filled 2018-08-26: qty 50

## 2018-08-26 MED ORDER — SODIUM CHLORIDE 0.9 % IV SOLN
20.0000 mg | Freq: Once | INTRAVENOUS | Status: AC
  Administered 2018-08-26: 20 mg via INTRAVENOUS
  Filled 2018-08-26: qty 2

## 2018-08-26 NOTE — Progress Notes (Signed)
Per Dr. Grayland Ormond give 20 mg of Decadron prior to Rituxan infusion.

## 2018-08-26 NOTE — Progress Notes (Signed)
[place]  Telephone:(336) [phone] Fax:(336) [phone]  ID: Samuel Rojas OB: 07-Mar-1990  MR#: [id number]  BLT#:[id number]  Patient Care Team: [facility], [place] as PCP - General (General Practice)  CHIEF COMPLAINT: ITP.  INTERVAL HISTORY: Patient returns to clinic today for further evaluation and consideration of cycle 2 of 4 of weekly Rituxan.  He had some itching during his first infusion which resolved with extra Benadryl.  He otherwise feels well and is asymptomatic. He denies any easy bleeding or bruising. He has no neurologic complaints. He denies any recent fevers or illnesses. He has a good appetite and denies weight loss.  He has no chest pain, shortness of breath, cough, or hemoptysis.  He denies any nausea, vomiting, constipation, or diarrhea. He has no urinary complaints.  Patient offers no specific complaints today.  REVIEW OF SYSTEMS:   Review of Systems  Constitutional: Negative.  Negative for fever, malaise/fatigue and weight loss.  HENT: Negative.  Negative for nosebleeds.   Respiratory: Negative.  Negative for cough, hemoptysis and shortness of breath.   Cardiovascular: Negative.  Negative for chest pain and leg swelling.  Gastrointestinal: Negative.  Negative for abdominal pain, blood in stool and melena.  Genitourinary: Negative.  Negative for hematuria.  Musculoskeletal: Negative.   Skin: Negative.  Negative for rash.  Neurological: Negative.  Negative for sensory change, focal weakness and weakness.  Endo/Heme/Allergies: Does not bruise/bleed easily.  Psychiatric/Behavioral: Negative.  The patient is not nervous/anxious.     As per HPI. Otherwise, a complete review of systems is negative.  PAST MEDICAL HISTORY: Past Medical History:  Diagnosis Date  . Cellulitis    left leg  . Idiopathic thrombocytopenic purpura (ITP) (HCC)     PAST SURGICAL HISTORY: Past Surgical History:  Procedure Laterality Date  .  PORTA CATH INSERTION N/A 09/20/2017   Procedure: PORTA CATH INSERTION;  Surgeon: Algernon Huxley, MD;  Location: [facility];  Service: Cardiovascular;  Laterality: N/A;  . TONSILLECTOMY      FAMILY HISTORY: Family History  Problem Relation Age of Onset  . Hypertension Mother   . Diabetes Mother        ADVANCED DIRECTIVES:    HEALTH MAINTENANCE: Social History   Tobacco Use  . Smoking status: Never Smoker  . Smokeless tobacco: Never Used  Substance Use Topics  . Alcohol use: No  . Drug use: No     Colonoscopy:  PAP:  Bone density:  Lipid panel:  Allergies  Allergen Reactions  . Augmentin [Amoxicillin-Pot Clavulanate] Hives  . Sulfamethoxazole-Trimethoprim     Dehydration, confusion , high BP    No current outpatient medications on file.   No current facility-administered medications for this visit.    Facility-Administered Medications Ordered in Other Visits  Medication Dose Route Frequency Provider Last Rate Last Dose  . famotidine (PEPCID) IVPB 20 mg premix  20 mg Intravenous Q12H Lloyd Huger, MD   Stopped at 10/01/17 1001  . famotidine (PEPCID) IVPB 20 mg premix  20 mg Intravenous Q12H Lloyd Huger, MD   Stopped at 08/26/18 1059  . sodium chloride flush (NS) 0.9 % injection 10 mL  10 mL Intracatheter PRN Lloyd Huger, MD   10 mL at 10/01/17 0855    OBJECTIVE: Vitals:   08/26/18 0935  BP: (!) 134/93  Pulse: 78  Temp: 97.9 F (36.6 C)     Body mass index is 60.17 kg/m.    ECOG FS:0 - Asymptomatic  General:  Obese, no acute distress. Eyes: Pink conjunctiva, anicteric sclera. HEENT: Normocephalic, moist mucous membranes, clear oropharnyx. Lungs: Clear to auscultation bilaterally. Heart: Regular rate and rhythm. No rubs, murmurs, or gallops. Abdomen: Soft, nontender, nondistended. No organomegaly noted, normoactive bowel sounds. Musculoskeletal: No edema, cyanosis, or clubbing. Neuro: Alert, answering all questions  appropriately. Cranial nerves grossly intact. Skin: No rashes or petechiae noted. Psych: Normal affect.  LAB RESULTS:  Lab Results  Component Value Date   NA 138 08/02/2018   K 3.9 08/02/2018   CL 105 08/02/2018   CO2 25 08/02/2018   GLUCOSE 100 (H) 08/02/2018   BUN 13 08/02/2018   CREATININE 0.77 08/02/2018   CALCIUM 8.5 (L) 08/02/2018   PROT 7.1 08/02/2018   ALBUMIN 3.8 08/02/2018   AST 23 08/02/2018   ALT 36 08/02/2018   ALKPHOS 76 08/02/2018   BILITOT 0.6 08/02/2018   GFRNONAA >60 08/02/2018   GFRAA >60 08/02/2018    Lab Results  Component Value Date   WBC 6.8 08/26/2018   NEUTROABS 4.8 08/26/2018   HGB 12.8 (L) 08/26/2018   HCT 40.9 08/26/2018   MCV 78.7 (L) 08/26/2018   PLT 32 (L) 08/26/2018   Lab Results  Component Value Date   IRON 50 09/09/2015   TIBC 320 09/09/2015   IRONPCTSAT 16 (L) 09/09/2015   Lab Results  Component Value Date   FERRITIN 75 09/09/2015     STUDIES: No results found.  ASSESSMENT: ITP.  PLAN:     1.  ITP: Previously, patient received 5 days of 100 mg prednisone with an excellent response to his platelets increasing to greater than 200.  Unfortunately, response was not durable. 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  Splenic ultrasound did not reveal any evidence of splenomegaly.  Bone marrow biopsy was not completed secondary to technical difficulties with patient's body habitus.  Patient completed his first round of weekly Rituxan x4 on October 08, 2017.  Patient's platelet count has improved to 32.  Proceed with cycle 2 of 4 of weekly Rituxan.  Have increased premedication of Benadryl to 50 mg IV and Decadron to 20 mg IV.  Return to clinic in 1 week for further evaluation and consideration of cycle 3. 2.  Poor venous access: Patient now has a port.  Continue port flushes every 6 weeks. 3.  Nontraumatic rhabdomyolysis: Resolved.  CK levels are within normal limits.  I spent a total  of 30 minutes face-to-face with the patient of which greater than 50% of the visit was spent in counseling and coordination of care as detailed above.  Patient expressed understanding and was in agreement with this plan. He also understands that He can call clinic at any time with any questions, concerns, or complaints.    Lloyd Huger, MD   08/26/2018 3:24 PM

## 2018-08-26 NOTE — Progress Notes (Signed)
Patient stated that he had been doing well with no complaints. 

## 2018-08-28 NOTE — Progress Notes (Signed)
[place]  Telephone:(336) [phone] Fax:(336) [phone]  ID: Samuel Rojas OB: 1990-06-02  MR#: [id number]  UDJ#:[id number]  Patient Care Team: [facility], [place] as PCP - General (General Practice)  CHIEF COMPLAINT: ITP.  INTERVAL HISTORY: Patient returns to clinic today for further evaluation and consideration of cycle 3 of 4 of weekly Rituxan.  He tolerated his second fusion well with increased premeds and slowing infusion rate.  He currently feels well and is asymptomatic. He denies any easy bleeding or bruising. He has no neurologic complaints. He denies any recent fevers or illnesses. He has a good appetite and denies weight loss.  He has no chest pain, shortness of breath, cough, or hemoptysis.  He denies any nausea, vomiting, constipation, or diarrhea. He has no urinary complaints.  Patient feels at his baseline offers no specific complaints today.  REVIEW OF SYSTEMS:   Review of Systems  Constitutional: Negative.  Negative for fever, malaise/fatigue and weight loss.  HENT: Negative.  Negative for nosebleeds.   Respiratory: Negative.  Negative for cough, hemoptysis and shortness of breath.   Cardiovascular: Negative.  Negative for chest pain and leg swelling.  Gastrointestinal: Negative.  Negative for abdominal pain, blood in stool and melena.  Genitourinary: Negative.  Negative for hematuria.  Musculoskeletal: Negative.   Skin: Negative.  Negative for rash.  Neurological: Negative.  Negative for sensory change, focal weakness and weakness.  Endo/Heme/Allergies: Does not bruise/bleed easily.  Psychiatric/Behavioral: Negative.  The patient is not nervous/anxious.     As per HPI. Otherwise, a complete review of systems is negative.  PAST MEDICAL HISTORY: Past Medical History:  Diagnosis Date  . Cellulitis    left leg  . Idiopathic thrombocytopenic purpura (ITP) (HCC)     PAST SURGICAL HISTORY: Past Surgical History:   Procedure Laterality Date  . PORTA CATH INSERTION N/A 09/20/2017   Procedure: PORTA CATH INSERTION;  Surgeon: Algernon Huxley, MD;  Location: [facility];  Service: Cardiovascular;  Laterality: N/A;  . TONSILLECTOMY      FAMILY HISTORY: Family History  Problem Relation Age of Onset  . Hypertension Mother   . Diabetes Mother        ADVANCED DIRECTIVES:    HEALTH MAINTENANCE: Social History   Tobacco Use  . Smoking status: Never Smoker  . Smokeless tobacco: Never Used  Substance Use Topics  . Alcohol use: No  . Drug use: No     Colonoscopy:  PAP:  Bone density:  Lipid panel:  Allergies  Allergen Reactions  . Augmentin [Amoxicillin-Pot Clavulanate] Hives  . Sulfamethoxazole-Trimethoprim     Dehydration, confusion , high BP    No current outpatient medications on file.   No current facility-administered medications for this visit.    Facility-Administered Medications Ordered in Other Visits  Medication Dose Route Frequency Provider Last Rate Last Dose  . famotidine (PEPCID) IVPB 20 mg premix  20 mg Intravenous Q12H Lloyd Huger, MD   Stopped at 10/01/17 1001  . sodium chloride flush (NS) 0.9 % injection 10 mL  10 mL Intracatheter PRN Lloyd Huger, MD   10 mL at 10/01/17 0855    OBJECTIVE: Vitals:   09/02/18 0843  BP: 124/82  Pulse: 60  Resp: 16  Temp: (!) 97.3 F (36.3 C)     Body mass index is 59.2 kg/m.    ECOG FS:0 - Asymptomatic  General: Obese, no acute distress. Eyes: Pink conjunctiva, anicteric sclera. HEENT: Normocephalic, moist mucous membranes. Lungs: Clear  to auscultation bilaterally. Heart: Regular rate and rhythm. No rubs, murmurs, or gallops. Abdomen: Soft, nontender, nondistended. No organomegaly noted, normoactive bowel sounds. Musculoskeletal: No edema, cyanosis, or clubbing. Neuro: Alert, answering all questions appropriately. Cranial nerves grossly intact. Skin: No rashes or petechiae noted. Psych: Normal affect.   LAB RESULTS:  Lab Results  Component Value Date   NA 138 08/02/2018   K 3.9 08/02/2018   CL 105 08/02/2018   CO2 25 08/02/2018   GLUCOSE 100 (H) 08/02/2018   BUN 13 08/02/2018   CREATININE 0.77 08/02/2018   CALCIUM 8.5 (L) 08/02/2018   PROT 7.1 08/02/2018   ALBUMIN 3.8 08/02/2018   AST 23 08/02/2018   ALT 36 08/02/2018   ALKPHOS 76 08/02/2018   BILITOT 0.6 08/02/2018   GFRNONAA >60 08/02/2018   GFRAA >60 08/02/2018    Lab Results  Component Value Date   WBC 7.1 09/02/2018   NEUTROABS 4.9 09/02/2018   HGB 12.7 (L) 09/02/2018   HCT 39.5 09/02/2018   MCV 77.1 (L) 09/02/2018   PLT 29 (LL) 09/02/2018   Lab Results  Component Value Date   IRON 50 09/09/2015   TIBC 320 09/09/2015   IRONPCTSAT 16 (L) 09/09/2015   Lab Results  Component Value Date   FERRITIN 75 09/09/2015     STUDIES: No results found.  ASSESSMENT: ITP.  PLAN:     1.  ITP: Previously, patient received 5 days of 100 mg prednisone with an excellent response to his platelets increasing to greater than 200.  Unfortunately, response was not durable. 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  Splenic ultrasound did not reveal any evidence of splenomegaly.  Bone marrow biopsy was not completed secondary to technical difficulties with patient's body habitus.  Patient completed his first round of weekly Rituxan x4 on October 08, 2017.  Patient's platelet count is unchanged at 29.  Proceed with cycle 3 of 4 of weekly Rituxan.  Continue with increased premedication of Benadryl 50 mg IV and Decadron 20 mg IV.  Return to clinic in 1 week for lab and treatment only patient will then return to clinic in 4 weeks for laboratory work with a virtual visit to discuss the results.   2.  Poor venous access: Patient now has a port.  Continue port flushes every 6 weeks. 3.  Nontraumatic rhabdomyolysis: Resolved.  CK levels are within normal limits.  I spent a total of 30 minutes  face-to-face with the patient of which greater than 50% of the visit was spent in counseling and coordination of care as detailed above.  Patient expressed understanding and was in agreement with this plan. He also understands that He can call clinic at any time with any questions, concerns, or complaints.    Lloyd Huger, MD   09/02/2018 9:04 AM

## 2018-09-02 ENCOUNTER — Inpatient Hospital Stay: Payer: BC Managed Care – PPO

## 2018-09-02 ENCOUNTER — Encounter: Payer: Self-pay | Admitting: Oncology

## 2018-09-02 ENCOUNTER — Inpatient Hospital Stay (HOSPITAL_BASED_OUTPATIENT_CLINIC_OR_DEPARTMENT_OTHER): Payer: BC Managed Care – PPO | Admitting: Oncology

## 2018-09-02 ENCOUNTER — Other Ambulatory Visit: Payer: Self-pay

## 2018-09-02 VITALS — BP 124/82 | HR 60 | Temp 97.3°F | Resp 16 | Ht 66.0 in | Wt 366.8 lb

## 2018-09-02 DIAGNOSIS — D696 Thrombocytopenia, unspecified: Secondary | ICD-10-CM

## 2018-09-02 DIAGNOSIS — D693 Immune thrombocytopenic purpura: Secondary | ICD-10-CM

## 2018-09-02 LAB — CBC WITH DIFFERENTIAL/PLATELET
Abs Immature Granulocytes: 0.04 10*3/uL (ref 0.00–0.07)
Basophils Absolute: 0 10*3/uL (ref 0.0–0.1)
Basophils Relative: 0 %
Eosinophils Absolute: 0.1 10*3/uL (ref 0.0–0.5)
Eosinophils Relative: 1 %
HCT: 39.5 % (ref 39.0–52.0)
Hemoglobin: 12.7 g/dL — ABNORMAL LOW (ref 13.0–17.0)
Immature Granulocytes: 1 %
Lymphocytes Relative: 24 %
Lymphs Abs: 1.7 10*3/uL (ref 0.7–4.0)
MCH: 24.8 pg — ABNORMAL LOW (ref 26.0–34.0)
MCHC: 32.2 g/dL (ref 30.0–36.0)
MCV: 77.1 fL — ABNORMAL LOW (ref 80.0–100.0)
Monocytes Absolute: 0.4 10*3/uL (ref 0.1–1.0)
Monocytes Relative: 6 %
Neutro Abs: 4.9 10*3/uL (ref 1.7–7.7)
Neutrophils Relative %: 68 %
Platelets: 29 10*3/uL — CL (ref 150–400)
RBC: 5.12 MIL/uL (ref 4.22–5.81)
RDW: 14 % (ref 11.5–15.5)
WBC: 7.1 10*3/uL (ref 4.0–10.5)
nRBC: 0 % (ref 0.0–0.2)

## 2018-09-02 MED ORDER — SODIUM CHLORIDE 0.9 % IV SOLN
20.0000 mg | Freq: Once | INTRAVENOUS | Status: AC
  Administered 2018-09-02: 20 mg via INTRAVENOUS
  Filled 2018-09-02: qty 2

## 2018-09-02 MED ORDER — FAMOTIDINE IN NACL 20-0.9 MG/50ML-% IV SOLN
20.0000 mg | Freq: Two times a day (BID) | INTRAVENOUS | Status: DC
  Administered 2018-09-02: 20 mg via INTRAVENOUS
  Filled 2018-09-02: qty 50

## 2018-09-02 MED ORDER — ACETAMINOPHEN 325 MG PO TABS
650.0000 mg | ORAL_TABLET | Freq: Once | ORAL | Status: AC
  Administered 2018-09-02: 09:00:00 650 mg via ORAL
  Filled 2018-09-02: qty 2

## 2018-09-02 MED ORDER — SODIUM CHLORIDE 0.9 % IV SOLN
375.0000 mg/m2 | Freq: Once | INTRAVENOUS | Status: AC
  Administered 2018-09-02: 1000 mg via INTRAVENOUS
  Filled 2018-09-02: qty 100

## 2018-09-02 MED ORDER — SODIUM CHLORIDE 0.9% FLUSH
10.0000 mL | Freq: Once | INTRAVENOUS | Status: AC
  Administered 2018-09-02: 10 mL via INTRAVENOUS
  Filled 2018-09-02: qty 10

## 2018-09-02 MED ORDER — DIPHENHYDRAMINE HCL 50 MG/ML IJ SOLN
50.0000 mg | Freq: Once | INTRAMUSCULAR | Status: AC
  Administered 2018-09-02: 09:00:00 50 mg via INTRAVENOUS
  Filled 2018-09-02: qty 1

## 2018-09-02 MED ORDER — SODIUM CHLORIDE 0.9 % IV SOLN
Freq: Once | INTRAVENOUS | Status: AC
  Administered 2018-09-02: 09:00:00 via INTRAVENOUS
  Filled 2018-09-02: qty 250

## 2018-09-02 MED ORDER — HEPARIN SOD (PORK) LOCK FLUSH 100 UNIT/ML IV SOLN
500.0000 [IU] | Freq: Once | INTRAVENOUS | Status: AC | PRN
  Administered 2018-09-02: 500 [IU]
  Filled 2018-09-02: qty 5

## 2018-09-02 NOTE — Progress Notes (Signed)
No issues and doing fine with rituxan

## 2018-09-04 ENCOUNTER — Other Ambulatory Visit: Payer: Self-pay | Admitting: Oncology

## 2018-09-09 ENCOUNTER — Inpatient Hospital Stay: Payer: BC Managed Care – PPO

## 2018-09-09 ENCOUNTER — Other Ambulatory Visit: Payer: Self-pay

## 2018-09-09 VITALS — BP 129/83 | HR 89 | Temp 99.3°F | Resp 18 | Wt 368.1 lb

## 2018-09-09 DIAGNOSIS — Z95828 Presence of other vascular implants and grafts: Secondary | ICD-10-CM

## 2018-09-09 DIAGNOSIS — D696 Thrombocytopenia, unspecified: Secondary | ICD-10-CM

## 2018-09-09 DIAGNOSIS — D693 Immune thrombocytopenic purpura: Secondary | ICD-10-CM | POA: Diagnosis not present

## 2018-09-09 LAB — CBC WITH DIFFERENTIAL/PLATELET
Abs Immature Granulocytes: 0.02 10*3/uL (ref 0.00–0.07)
Basophils Absolute: 0 10*3/uL (ref 0.0–0.1)
Basophils Relative: 0 %
Eosinophils Absolute: 0.1 10*3/uL (ref 0.0–0.5)
Eosinophils Relative: 1 %
HCT: 40.6 % (ref 39.0–52.0)
Hemoglobin: 13.1 g/dL (ref 13.0–17.0)
Immature Granulocytes: 0 %
Lymphocytes Relative: 20 %
Lymphs Abs: 1.7 10*3/uL (ref 0.7–4.0)
MCH: 25 pg — ABNORMAL LOW (ref 26.0–34.0)
MCHC: 32.3 g/dL (ref 30.0–36.0)
MCV: 77.5 fL — ABNORMAL LOW (ref 80.0–100.0)
Monocytes Absolute: 0.4 10*3/uL (ref 0.1–1.0)
Monocytes Relative: 5 %
Neutro Abs: 6 10*3/uL (ref 1.7–7.7)
Neutrophils Relative %: 74 %
Platelets: 18 10*3/uL — CL (ref 150–400)
RBC: 5.24 MIL/uL (ref 4.22–5.81)
RDW: 14.1 % (ref 11.5–15.5)
WBC: 8.2 10*3/uL (ref 4.0–10.5)
nRBC: 0 % (ref 0.0–0.2)

## 2018-09-09 MED ORDER — HEPARIN SOD (PORK) LOCK FLUSH 100 UNIT/ML IV SOLN
500.0000 [IU] | Freq: Once | INTRAVENOUS | Status: AC | PRN
  Administered 2018-09-09: 500 [IU]
  Filled 2018-09-09: qty 5

## 2018-09-09 MED ORDER — DIPHENHYDRAMINE HCL 50 MG/ML IJ SOLN
50.0000 mg | Freq: Once | INTRAMUSCULAR | Status: AC
  Administered 2018-09-09: 09:00:00 50 mg via INTRAVENOUS
  Filled 2018-09-09: qty 1

## 2018-09-09 MED ORDER — SODIUM CHLORIDE 0.9 % IV SOLN
20.0000 mg | Freq: Once | INTRAVENOUS | Status: AC
  Administered 2018-09-09: 09:00:00 20 mg via INTRAVENOUS
  Filled 2018-09-09: qty 2

## 2018-09-09 MED ORDER — ACETAMINOPHEN 325 MG PO TABS
650.0000 mg | ORAL_TABLET | Freq: Once | ORAL | Status: AC
  Administered 2018-09-09: 650 mg via ORAL
  Filled 2018-09-09: qty 2

## 2018-09-09 MED ORDER — SODIUM CHLORIDE 0.9 % IV SOLN
375.0000 mg/m2 | Freq: Once | INTRAVENOUS | Status: AC
  Administered 2018-09-09: 10:00:00 1000 mg via INTRAVENOUS
  Filled 2018-09-09: qty 100

## 2018-09-09 MED ORDER — FAMOTIDINE IN NACL 20-0.9 MG/50ML-% IV SOLN
20.0000 mg | Freq: Two times a day (BID) | INTRAVENOUS | Status: DC
  Administered 2018-09-09: 20 mg via INTRAVENOUS
  Filled 2018-09-09: qty 50

## 2018-09-09 MED ORDER — SODIUM CHLORIDE 0.9 % IV SOLN
Freq: Once | INTRAVENOUS | Status: AC
  Administered 2018-09-09: 09:00:00 via INTRAVENOUS
  Filled 2018-09-09: qty 250

## 2018-09-09 MED ORDER — SODIUM CHLORIDE 0.9% FLUSH
10.0000 mL | Freq: Once | INTRAVENOUS | Status: AC
  Administered 2018-09-09: 10 mL via INTRAVENOUS
  Filled 2018-09-09: qty 10

## 2018-09-09 NOTE — Progress Notes (Signed)
Pt tolerated infusion well. Pt and VS stable at discharge.  

## 2018-10-03 NOTE — Progress Notes (Signed)
[place]  [phone] [phone]) [phone]  ID: DAMANI KELEMEN OB: 05-13-1990  MR#: [id number]  VVK#:[id number]  Patient Care Team: Center, [facility] as PCP - General (General Practice)  I connected with Tajuan D Eschbach on 10/07/18 at  2:45 PM EDT by video enabled telemedicine visit and verified that I am speaking with the correct person using two identifiers.   I discussed the limitations, risks, security and privacy concerns of performing an evaluation and management service by telemedicine and the availability of in-person appointments. I also discussed with the patient that there may be a patient responsible charge related to this service. The patient expressed understanding and agreed to proceed.   Other persons participating in the visit and their role in the encounter: Patient, MD  Patient's location: Home Provider's location: Clinic  CHIEF COMPLAINT: ITP.  INTERVAL HISTORY: Patient agreed to video enabled telemedicine visit for further evaluation and discussion of his laboratory work.  He continues to feel well and remains asymptomatic. He denies any easy bleeding or bruising. He has no neurologic complaints. He denies any recent fevers or illnesses. He has a good appetite and denies weight loss.  He has no chest pain, shortness of breath, cough, or hemoptysis.  He denies any nausea, vomiting, constipation, or diarrhea. He has no urinary complaints.  Patient feels at his baseline offers no specific complaints today.  REVIEW OF SYSTEMS:   Review of Systems  Constitutional: Negative.  Negative for fever, malaise/fatigue and weight loss.  HENT: Negative.  Negative for nosebleeds.   Respiratory: Negative.  Negative for cough, hemoptysis and shortness of breath.   Cardiovascular: Negative.  Negative for chest pain and leg swelling.  Gastrointestinal: Negative.  Negative for abdominal pain, blood in stool and melena.   Genitourinary: Negative.  Negative for hematuria.  Musculoskeletal: Negative.   Skin: Negative.  Negative for rash.  Neurological: Negative.  Negative for sensory change, focal weakness and weakness.  Endo/Heme/Allergies: Does not bruise/bleed easily.  Psychiatric/Behavioral: Negative.  The patient is not nervous/anxious.     As per HPI. Otherwise, a complete review of systems is negative.  PAST MEDICAL HISTORY: Past Medical History:  Diagnosis Date  . Cellulitis    left leg  . Idiopathic thrombocytopenic purpura (ITP) (HCC)     PAST SURGICAL HISTORY: Past Surgical History:  Procedure Laterality Date  . PORTA CATH INSERTION N/A 09/20/2017   Procedure: PORTA CATH INSERTION;  Surgeon: Algernon Huxley, MD;  Location: [facility];  Service: Cardiovascular;  Laterality: N/A;  . TONSILLECTOMY      FAMILY HISTORY: Family History  Problem Relation Age of Onset  . Hypertension Mother   . Diabetes Mother        ADVANCED DIRECTIVES:    HEALTH MAINTENANCE: Social History   Tobacco Use  . Smoking status: Never Smoker  . Smokeless tobacco: Never Used  Substance Use Topics  . Alcohol use: No  . Drug use: No     Colonoscopy:  PAP:  Bone density:  Lipid panel:  Allergies  Allergen Reactions  . Augmentin [Amoxicillin-Pot Clavulanate] Hives  . Sulfamethoxazole-Trimethoprim     Dehydration, confusion , high BP    No current outpatient medications on file.   No current facility-administered medications for this visit.    Facility-Administered Medications Ordered in Other Visits  Medication Dose Route Frequency Provider Last Rate Last Dose  . famotidine (PEPCID) IVPB 20 mg premix  20 mg Intravenous Q12H Lloyd Huger, MD  Stopped at 10/01/17 1001  . sodium chloride flush (NS) 0.9 % injection 10 mL  10 mL Intracatheter PRN Lloyd Huger, MD   10 mL at 10/01/17 0855    OBJECTIVE: There were no vitals filed for this visit.   There is no height or  weight on file to calculate BMI.    ECOG FS:0 - Asymptomatic  General: Obese, no acute distress. HEENT: Normocephalic. Neuro: Alert, answering all questions appropriately. Cranial nerves grossly intact. Psych: Normal affect.  LAB RESULTS:  Lab Results  Component Value Date   NA 138 08/02/2018   K 3.9 08/02/2018   CL 105 08/02/2018   CO2 25 08/02/2018   GLUCOSE 100 (H) 08/02/2018   BUN 13 08/02/2018   CREATININE 0.77 08/02/2018   CALCIUM 8.5 (L) 08/02/2018   PROT 7.1 08/02/2018   ALBUMIN 3.8 08/02/2018   AST 23 08/02/2018   ALT 36 08/02/2018   ALKPHOS 76 08/02/2018   BILITOT 0.6 08/02/2018   GFRNONAA >60 08/02/2018   GFRAA >60 08/02/2018    Lab Results  Component Value Date   WBC 5.9 10/05/2018   NEUTROABS 4.3 10/05/2018   HGB 13.9 10/05/2018   HCT 43.7 10/05/2018   MCV 78.3 (L) 10/05/2018   PLT 47 (L) 10/05/2018   Lab Results  Component Value Date   IRON 50 09/09/2015   TIBC 320 09/09/2015   IRONPCTSAT 16 (L) 09/09/2015   Lab Results  Component Value Date   FERRITIN 75 09/09/2015     STUDIES: No results found.  ASSESSMENT: ITP.  PLAN:     1.  ITP: Previously, patient received 5 days of 100 mg prednisone with an excellent response to his platelets increasing to greater than 200.  Unfortunately, response was not durable. 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  Splenic ultrasound did not reveal any evidence of splenomegaly.  Bone marrow biopsy was not completed secondary to technical difficulties with patient's body habitus.  Patient completed his first round of weekly Rituxan x4 on October 08, 2017.  He then completed his second round of weekly Rituxan on September 09, 2018.  Patient platelet count remains decreased, but is improved to 47.  Return to clinic in 6 weeks for laboratory work only and then in 3 months for laboratory work and video assisted telemedicine visit.  2.  Poor venous access: Patient now has a port.   Continue port flushes every 6 weeks. 3.  Nontraumatic rhabdomyolysis: Resolved.  CK levels are within normal limits.  I provided 15 minutes of face-to-face video visit time during this encounter, and > 50% was spent counseling as documented under my assessment & plan.   Patient expressed understanding and was in agreement with this plan. He also understands that He can call clinic at any time with any questions, concerns, or complaints.    Lloyd Huger, MD   10/07/2018 6:46 AM

## 2018-10-04 ENCOUNTER — Other Ambulatory Visit: Payer: Self-pay

## 2018-10-05 ENCOUNTER — Other Ambulatory Visit: Payer: Self-pay

## 2018-10-05 ENCOUNTER — Inpatient Hospital Stay: Payer: BC Managed Care – PPO | Attending: Oncology

## 2018-10-05 DIAGNOSIS — D693 Immune thrombocytopenic purpura: Secondary | ICD-10-CM | POA: Diagnosis present

## 2018-10-05 DIAGNOSIS — D696 Thrombocytopenia, unspecified: Secondary | ICD-10-CM

## 2018-10-05 LAB — CBC WITH DIFFERENTIAL/PLATELET
Abs Immature Granulocytes: 0.01 10*3/uL (ref 0.00–0.07)
Basophils Absolute: 0 10*3/uL (ref 0.0–0.1)
Basophils Relative: 0 %
Eosinophils Absolute: 0.1 10*3/uL (ref 0.0–0.5)
Eosinophils Relative: 1 %
HCT: 43.7 % (ref 39.0–52.0)
Hemoglobin: 13.9 g/dL (ref 13.0–17.0)
Immature Granulocytes: 0 %
Lymphocytes Relative: 21 %
Lymphs Abs: 1.3 10*3/uL (ref 0.7–4.0)
MCH: 24.9 pg — ABNORMAL LOW (ref 26.0–34.0)
MCHC: 31.8 g/dL (ref 30.0–36.0)
MCV: 78.3 fL — ABNORMAL LOW (ref 80.0–100.0)
Monocytes Absolute: 0.3 10*3/uL (ref 0.1–1.0)
Monocytes Relative: 5 %
Neutro Abs: 4.3 10*3/uL (ref 1.7–7.7)
Neutrophils Relative %: 73 %
Platelets: 47 10*3/uL — ABNORMAL LOW (ref 150–400)
RBC: 5.58 MIL/uL (ref 4.22–5.81)
RDW: 13.9 % (ref 11.5–15.5)
WBC: 5.9 10*3/uL (ref 4.0–10.5)
nRBC: 0 % (ref 0.0–0.2)

## 2018-10-06 ENCOUNTER — Inpatient Hospital Stay (HOSPITAL_BASED_OUTPATIENT_CLINIC_OR_DEPARTMENT_OTHER): Payer: BC Managed Care – PPO | Admitting: Oncology

## 2018-10-06 ENCOUNTER — Other Ambulatory Visit: Payer: Self-pay

## 2018-10-06 DIAGNOSIS — D693 Immune thrombocytopenic purpura: Secondary | ICD-10-CM | POA: Diagnosis not present

## 2018-10-06 NOTE — Progress Notes (Signed)
Patient denies any concerns today.  

## 2018-11-17 ENCOUNTER — Inpatient Hospital Stay: Payer: BC Managed Care – PPO

## 2018-11-18 ENCOUNTER — Inpatient Hospital Stay: Payer: BC Managed Care – PPO | Attending: Oncology

## 2018-11-18 ENCOUNTER — Other Ambulatory Visit: Payer: Self-pay

## 2018-11-18 DIAGNOSIS — D696 Thrombocytopenia, unspecified: Secondary | ICD-10-CM

## 2018-11-18 DIAGNOSIS — D693 Immune thrombocytopenic purpura: Secondary | ICD-10-CM | POA: Insufficient documentation

## 2018-11-18 DIAGNOSIS — Z95828 Presence of other vascular implants and grafts: Secondary | ICD-10-CM

## 2018-11-18 DIAGNOSIS — Z452 Encounter for adjustment and management of vascular access device: Secondary | ICD-10-CM | POA: Diagnosis not present

## 2018-11-18 LAB — CBC WITH DIFFERENTIAL/PLATELET
Abs Immature Granulocytes: 0.02 10*3/uL (ref 0.00–0.07)
Basophils Absolute: 0 10*3/uL (ref 0.0–0.1)
Basophils Relative: 0 %
Eosinophils Absolute: 0.1 10*3/uL (ref 0.0–0.5)
Eosinophils Relative: 1 %
HCT: 40.2 % (ref 39.0–52.0)
Hemoglobin: 12.8 g/dL — ABNORMAL LOW (ref 13.0–17.0)
Immature Granulocytes: 0 %
Lymphocytes Relative: 24 %
Lymphs Abs: 1.8 10*3/uL (ref 0.7–4.0)
MCH: 25 pg — ABNORMAL LOW (ref 26.0–34.0)
MCHC: 31.8 g/dL (ref 30.0–36.0)
MCV: 78.7 fL — ABNORMAL LOW (ref 80.0–100.0)
Monocytes Absolute: 0.5 10*3/uL (ref 0.1–1.0)
Monocytes Relative: 7 %
Neutro Abs: 5.1 10*3/uL (ref 1.7–7.7)
Neutrophils Relative %: 68 %
Platelets: 55 10*3/uL — ABNORMAL LOW (ref 150–400)
RBC: 5.11 MIL/uL (ref 4.22–5.81)
RDW: 13.8 % (ref 11.5–15.5)
WBC: 7.5 10*3/uL (ref 4.0–10.5)
nRBC: 0 % (ref 0.0–0.2)

## 2018-11-18 MED ORDER — HEPARIN SOD (PORK) LOCK FLUSH 100 UNIT/ML IV SOLN
500.0000 [IU] | Freq: Once | INTRAVENOUS | Status: AC
  Administered 2018-11-18: 15:00:00 500 [IU] via INTRAVENOUS

## 2018-11-18 MED ORDER — SODIUM CHLORIDE 0.9% FLUSH
10.0000 mL | Freq: Once | INTRAVENOUS | Status: AC
  Administered 2018-11-18: 10 mL via INTRAVENOUS
  Filled 2018-11-18: qty 10

## 2018-12-30 ENCOUNTER — Other Ambulatory Visit: Payer: BC Managed Care – PPO

## 2019-01-15 NOTE — Progress Notes (Signed)
[place]  [phone]) [phone] Fax:(336) [phone]  ID: Samuel Rojas OB: November 10, 1990  MR#: [id number]  UYQ#:[id number]  Patient Care Team: Center, [facility] as PCP - General (General Practice)  I connected with Samuel Rojas on 01/18/19 at  2:30 PM EST by video enabled telemedicine visit and verified that I am speaking with the correct person using two identifiers.   I discussed the limitations, risks, security and privacy concerns of performing an evaluation and management service by telemedicine and the availability of in-person appointments. I also discussed with the patient that there may be a patient responsible charge related to this service. The patient expressed understanding and agreed to proceed.   Other persons participating in the visit and their role in the encounter: Patient, MD  Patient's location: Home Provider's location: Clinic  CHIEF COMPLAINT: ITP.  INTERVAL HISTORY: Patient agreed to video enabled telemedicine visit for further evaluation and discussion of his laboratory work.  He currently feels well and is asymptomatic.  He denies any easy bleeding or bruising. He has no neurologic complaints. He denies any recent fevers or illnesses. He has a good appetite and denies weight loss.  He has no chest pain, shortness of breath, cough, or hemoptysis.  He denies any nausea, vomiting, constipation, or diarrhea. He has no urinary complaints.  Patient offers no specific complaints today.    REVIEW OF SYSTEMS:   Review of Systems  Constitutional: Negative.  Negative for fever, malaise/fatigue and weight loss.  HENT: Negative.  Negative for nosebleeds.   Respiratory: Negative.  Negative for cough, hemoptysis and shortness of breath.   Cardiovascular: Negative.  Negative for chest pain and leg swelling.  Gastrointestinal: Negative.  Negative for abdominal pain, blood in stool and melena.  Genitourinary: Negative.   Negative for hematuria.  Musculoskeletal: Negative.   Skin: Negative.  Negative for rash.  Neurological: Negative.  Negative for sensory change, focal weakness and weakness.  Endo/Heme/Allergies: Does not bruise/bleed easily.  Psychiatric/Behavioral: Negative.  The patient is not nervous/anxious.     As per HPI. Otherwise, a complete review of systems is negative.  PAST MEDICAL HISTORY: Past Medical History:  Diagnosis Date  . Cellulitis    left leg  . Idiopathic thrombocytopenic purpura (ITP) (HCC)     PAST SURGICAL HISTORY: Past Surgical History:  Procedure Laterality Date  . PORTA CATH INSERTION N/A 09/20/2017   Procedure: PORTA CATH INSERTION;  Surgeon: Algernon Huxley, MD;  Location: [place] [facility] [facility];  Service: Cardiovascular;  Laterality: N/A;  . TONSILLECTOMY      FAMILY HISTORY: Family History  Problem Relation Age of Onset  . Hypertension Mother   . Diabetes Mother        ADVANCED DIRECTIVES:    HEALTH MAINTENANCE: Social History   Tobacco Use  . Smoking status: Never Smoker  . Smokeless tobacco: Never Used  Substance Use Topics  . Alcohol use: No  . Drug use: No     Colonoscopy:  PAP:  Bone density:  Lipid panel:  Allergies  Allergen Reactions  . Augmentin [Amoxicillin-Pot Clavulanate] Hives  . Sulfamethoxazole-Trimethoprim     Dehydration, confusion , high BP    No current outpatient medications on file.   No current facility-administered medications for this visit.    Facility-Administered Medications Ordered in Other Visits  Medication Dose Route Frequency Provider Last Rate Last Dose  . famotidine (PEPCID) IVPB 20 mg premix  20 mg Intravenous Q12H Lloyd Huger, MD   Stopped  at 10/01/17 1001  . sodium chloride flush (NS) 0.9 % injection 10 mL  10 mL Intracatheter PRN Lloyd Huger, MD   10 mL at 10/01/17 0855    OBJECTIVE: There were no vitals filed for this visit.   There is no height or weight on file to calculate BMI.     ECOG FS:0 - Asymptomatic  General: Obese, no acute distress. HEENT: Normocephalic. Neuro: Alert, answering all questions appropriately. Cranial nerves grossly intact. Psych: Normal affect.  LAB RESULTS:  Lab Results  Component Value Date   NA 138 08/02/2018   K 3.9 08/02/2018   CL 105 08/02/2018   CO2 25 08/02/2018   GLUCOSE 100 (H) 08/02/2018   BUN 13 08/02/2018   CREATININE 0.77 08/02/2018   CALCIUM 8.5 (L) 08/02/2018   PROT 7.1 08/02/2018   ALBUMIN 3.8 08/02/2018   AST 23 08/02/2018   ALT 36 08/02/2018   ALKPHOS 76 08/02/2018   BILITOT 0.6 08/02/2018   GFRNONAA >60 08/02/2018   GFRAA >60 08/02/2018    Lab Results  Component Value Date   WBC 6.6 01/16/2019   NEUTROABS 4.9 01/16/2019   HGB 13.6 01/16/2019   HCT 43.5 01/16/2019   MCV 79.7 (L) 01/16/2019   PLT 49 (L) 01/16/2019   Lab Results  Component Value Date   IRON 50 09/09/2015   TIBC 320 09/09/2015   IRONPCTSAT 16 (L) 09/09/2015   Lab Results  Component Value Date   FERRITIN 75 09/09/2015     STUDIES: No results found.  ASSESSMENT: ITP.  PLAN:     1.  ITP: Previously, patient received 5 days of 100 mg prednisone with an excellent response to his platelets increasing to greater than 200.  Unfortunately, response was not durable. 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  Splenic ultrasound did not reveal any evidence of splenomegaly.  Bone marrow biopsy was not completed secondary to technical difficulties with patient's body habitus.  Patient completed his first round of weekly Rituxan x4 on October 08, 2017.  He then completed his second round of weekly Rituxan on September 09, 2018.  Platelet count remains chronically decreased, but essentially unchanged at 46.  Return to clinic in 2 months for laboratory work only and then in 4 months for laboratory work and video assisted telemedicine visit. 2.  Poor venous access: Patient now has a port.  Continue port  flushes every 6-8 weeks. 3.  Nontraumatic rhabdomyolysis: Resolved.  CK levels are within normal limits.  I provided 15 minutes of face-to-face video visit time during this encounter, and > 50% was spent counseling as documented under my assessment & plan.   Patient expressed understanding and was in agreement with this plan. He also understands that He can call clinic at any time with any questions, concerns, or complaints.    Lloyd Huger, MD   01/18/2019 8:55 AM

## 2019-01-16 ENCOUNTER — Inpatient Hospital Stay: Payer: BC Managed Care – PPO | Attending: Oncology

## 2019-01-16 ENCOUNTER — Other Ambulatory Visit: Payer: Self-pay

## 2019-01-16 DIAGNOSIS — Z452 Encounter for adjustment and management of vascular access device: Secondary | ICD-10-CM | POA: Diagnosis not present

## 2019-01-16 DIAGNOSIS — D696 Thrombocytopenia, unspecified: Secondary | ICD-10-CM

## 2019-01-16 DIAGNOSIS — D693 Immune thrombocytopenic purpura: Secondary | ICD-10-CM | POA: Diagnosis not present

## 2019-01-16 DIAGNOSIS — Z95828 Presence of other vascular implants and grafts: Secondary | ICD-10-CM

## 2019-01-16 LAB — CBC WITH DIFFERENTIAL/PLATELET
Abs Immature Granulocytes: 0.02 10*3/uL (ref 0.00–0.07)
Basophils Absolute: 0 10*3/uL (ref 0.0–0.1)
Basophils Relative: 0 %
Eosinophils Absolute: 0.1 10*3/uL (ref 0.0–0.5)
Eosinophils Relative: 1 %
HCT: 43.5 % (ref 39.0–52.0)
Hemoglobin: 13.6 g/dL (ref 13.0–17.0)
Immature Granulocytes: 0 %
Lymphocytes Relative: 21 %
Lymphs Abs: 1.4 10*3/uL (ref 0.7–4.0)
MCH: 24.9 pg — ABNORMAL LOW (ref 26.0–34.0)
MCHC: 31.3 g/dL (ref 30.0–36.0)
MCV: 79.7 fL — ABNORMAL LOW (ref 80.0–100.0)
Monocytes Absolute: 0.3 10*3/uL (ref 0.1–1.0)
Monocytes Relative: 4 %
Neutro Abs: 4.9 10*3/uL (ref 1.7–7.7)
Neutrophils Relative %: 74 %
Platelets: 49 10*3/uL — ABNORMAL LOW (ref 150–400)
RBC: 5.46 MIL/uL (ref 4.22–5.81)
RDW: 13.8 % (ref 11.5–15.5)
WBC: 6.6 10*3/uL (ref 4.0–10.5)
nRBC: 0 % (ref 0.0–0.2)

## 2019-01-16 MED ORDER — HEPARIN SOD (PORK) LOCK FLUSH 100 UNIT/ML IV SOLN
500.0000 [IU] | Freq: Once | INTRAVENOUS | Status: AC
  Administered 2019-01-16: 15:00:00 500 [IU] via INTRAVENOUS

## 2019-01-16 MED ORDER — SODIUM CHLORIDE 0.9% FLUSH
10.0000 mL | Freq: Once | INTRAVENOUS | Status: AC
  Administered 2019-01-16: 10 mL via INTRAVENOUS
  Filled 2019-01-16: qty 10

## 2019-01-17 ENCOUNTER — Inpatient Hospital Stay (HOSPITAL_BASED_OUTPATIENT_CLINIC_OR_DEPARTMENT_OTHER): Payer: BC Managed Care – PPO | Admitting: Oncology

## 2019-01-17 ENCOUNTER — Encounter: Payer: Self-pay | Admitting: Oncology

## 2019-01-17 DIAGNOSIS — D693 Immune thrombocytopenic purpura: Secondary | ICD-10-CM

## 2019-01-17 NOTE — Progress Notes (Signed)
Patient prescreened for appointment. Patient has no concerns or questions.  

## 2019-03-21 ENCOUNTER — Inpatient Hospital Stay: Payer: BC Managed Care – PPO | Attending: Oncology

## 2019-05-08 ENCOUNTER — Emergency Department
Admission: EM | Admit: 2019-05-08 | Discharge: 2019-05-08 | Disposition: A | Discharge status: Home / Self Care | Payer: BC Managed Care – PPO | Attending: Emergency Medicine | Admitting: Emergency Medicine

## 2019-05-08 ENCOUNTER — Other Ambulatory Visit: Payer: Self-pay

## 2019-05-08 ENCOUNTER — Encounter: Payer: Self-pay | Admitting: Emergency Medicine

## 2019-05-08 DIAGNOSIS — K0510 Chronic gingivitis, plaque induced: Secondary | ICD-10-CM | POA: Insufficient documentation

## 2019-05-08 DIAGNOSIS — R220 Localized swelling, mass and lump, head: Secondary | ICD-10-CM | POA: Diagnosis present

## 2019-05-08 MED ORDER — LIDOCAINE VISCOUS HCL 2 % MT SOLN: 15.0000 mL | OROMUCOSAL | 0 refills | Status: DC | PRN

## 2019-05-08 MED ORDER — DOXYCYCLINE HYCLATE 100 MG PO TABS
100.0000 mg | ORAL_TABLET | Freq: Once | ORAL | Status: AC
  Administered 2019-05-08: 100 mg via ORAL
  Filled 2019-05-08: qty 1

## 2019-05-08 MED ORDER — DOXYCYCLINE HYCLATE 100 MG PO CAPS: 100.0000 mg | ORAL_CAPSULE | Freq: Two times a day (BID) | ORAL | 0 refills | Status: AC

## 2019-05-08 MED ORDER — CHLORHEXIDINE GLUCONATE 0.12 % MT SOLN: 15.0000 mL | Freq: Two times a day (BID) | OROMUCOSAL | 0 refills | Status: DC

## 2019-05-08 MED ORDER — LIDOCAINE VISCOUS HCL 2 % MT SOLN
15.0000 mL | Freq: Once | OROMUCOSAL | Status: AC
  Administered 2019-05-08: 15 mL via OROMUCOSAL
  Filled 2019-05-08: qty 15

## 2019-05-08 NOTE — Discharge Instructions (Addendum)
OPTIONS FOR DENTAL FOLLOW UP CARE ° °[place] [facility] and Human Services - Local Safety Net Dental Clinics °http://www.ncdhhs.gov/dph/oralhealth/services/safetynetclinics.htm °  °[facility] (336-562-3123) ° °[facility] [place] ([phone]) ° °[facility] ([phone]) ° °[facility] ([phone]) ° °[facility] ([phone]) °This clinic caters to the indigent population and is on a lottery system. °Location: °[facility], [address], [place] °Clinic Hours: °Wednesdays from 6pm - 9pm, patients seen by a lottery system. °For dates, call or go to www.med.[facility].edu/[facility]/patients/Dental-[facility] °Services: °Cleanings, fillings and simple extractions. °Payment Options: °DENTAL WORK IS FREE OF CHARGE. Bring proof of income or support. °Best way to get seen: °Arrive at 5:15 pm - this is a lottery, NOT first come/first serve, so arriving earlier will not increase your chances of being seen. °  °  °[facility] °[phone] °Select option 1 for emergencies °  °Location: °[facility], [address], [place] °Clinic Hours: °No walk-ins accepted - call the day before to schedule an appointment. °Check in times are 9:30 am and 1:30 pm. °Services: °Simple extractions, temporary fillings, pulpectomy/pulp debridement, uncomplicated abscess drainage. °Payment Options: °PAYMENT IS DUE AT THE TIME OF SERVICE.  Fee is usually $100-200, additional surgical procedures (e.g. abscess drainage) may be extra. °Cash, checks, Visa/MasterCard accepted.  Can file Medicaid if patient is covered for dental - patient should call case worker to check. °No discount for [facility] patients. °Best way to get seen: °MUST call the day before and get onto the schedule. Can usually be seen the next 1-2 days. No walk-ins accepted. °  °  °[facility] °[phone] °   °Location: °[facility], [address], [place] °Clinic Hours: °M, W, Th, F 8am or 1:30pm, Tues 9a or 1:30 - first come/first served. °Services: °Simple extractions, temporary fillings, uncomplicated abscess drainage.  You do not need to be an [place] resident. °Payment Options: °PAYMENT IS DUE AT THE TIME OF SERVICE. °Dental insurance, otherwise sliding scale - bring proof of income or support. °Depending on income and treatment needed, cost is usually $50-200. °Best way to get seen: °Arrive early as it is first come/first served. °  °  °[facility] °[phone] °  °Location: °[address] °Clinic Hours: °Mon-Thu 8a-5p °Services: °Most basic dental services including extractions and fillings. °Payment Options: °PAYMENT IS DUE AT THE TIME OF SERVICE. °Sliding scale, up to 50% off - bring proof if income or support. °Medicaid with dental option accepted. °Best way to get seen: °Call to schedule an appointment, can usually be seen within 2 weeks OR they will try to see walk-ins - show up at 8a or 2p (you may have to wait). °  °  °[facility] °[phone] °[place] RESIDENTS ONLY °  °Location: °[facility], [address], [place], [place] [address] °Clinic Hours: By appointment only. °Monday - Thursday 8am-5pm, Friday 8am-12pm °Services: Cleanings, fillings, extractions. °Payment Options: °PAYMENT IS DUE AT THE TIME OF SERVICE. °Cash, Visa or MasterCard. Sliding scale - $30 minimum per service. °Best way to get seen: °Come in to office, complete packet and make an appointment - need proof of income °or support monies for each household member and proof of [place] residence. °Usually takes about a month to get in. °  °  °[facility] °[phone] °  °Location: °[address]   [place] °Clinic Hours: Walk-in Urgent Care Dental Services are offered Monday-Friday  mornings only. °The numbers of emergencies accepted daily is limited to the number of °providers available. °Maximum 15 - Mondays, Wednesdays & Thursdays °Maximum 10 - Tuesdays & Fridays °Services: °You do not need to be a [place] resident to be seen for a dental emergency. °Emergencies are defined as pain, swelling, abnormal bleeding, or dental trauma. Walkins will receive x-rays if needed. °NOTE: Dental cleaning is not an emergency. °Payment Options: °PAYMENT IS DUE AT THE TIME OF SERVICE. °Minimum co-pay is $40.00 for uninsured patients. °Minimum co-pay is $3.00 for Medicaid with dental coverage. °Dental Insurance is accepted and must be presented at time of visit. °Medicare does not cover dental. °Forms of payment: Cash, credit card, checks. °Best way to get seen: °If not previously registered with the clinic, walk-in dental registration begins at 7:15 am and is on a first come/first serve basis. °If previously registered with the clinic, call to make an appointment. °  °  °[facility] °[phone] °[place] RESIDENTS ONLY °  °Location: °[address], [place], [place] °Clinic Hours: °Mon-Thu 10a-2p °Services: Extractions only! °Payment Options: °FREE (donations accepted) - bring proof of income or support °Best way to get seen: °Call and schedule an appointment OR come at 8am on the 1st Monday of every month (except for holidays) when it is first come/first served. °  °  °[facility] °[phone] °  °Location: °[address], [place] °Clinic Hours: °Friday mornings °Services, Payment Options, Best way to get seen: °Call for info °

## 2019-05-08 NOTE — ED Triage Notes (Signed)
Pt arrives POV and ambulatory to triage with c/o bottom rt oral swelling. Pt states that this started yesterday morning. Pt is in NAD.

## 2019-05-08 NOTE — ED Notes (Signed)
Pt to room, pt states that pain is in the right side of face and gum on lower side. States that it is swollen and is painful to close mouth. 9/10. Reports that he has experienced pain for last 2 days. Denies any injury to site.

## 2019-05-08 NOTE — ED Provider Notes (Signed)
[facility] Emergency Department Provider Note  ____________________________________________  Time seen: Approximately 5:05 AM  I have reviewed the triage vital signs and the nursing notes.   HISTORY  Chief Complaint Oral Swelling   HPI Samuel Rojas is a 29 y.o. male who presents for evaluation of gum pain.  Patient reports swelling and pain on his gums on the right lower part.  No tooth pain, no facial swelling, no fever or chills, no trismus, no sore throat, no difficulty swallowing.  He denies any injuries.  The pain is 9 out of 10, sharp and throbbing, constant and nonradiating.   Past Medical History:  Diagnosis Date  . Cellulitis    left leg  . Idiopathic thrombocytopenic purpura (ITP) [facility])     Patient Active Problem List   Diagnosis Date Noted  . Chronic ITP (idiopathic thrombocytopenic purpura) (HCC) 08/26/2018  . Goals of care, counseling/discussion 08/13/2018  . Thrombocytopenia ([place]) 09/09/2015  . Cellulitis 01/16/2014    Past Surgical History:  Procedure Laterality Date  . PORTA CATH INSERTION N/A 09/20/2017   Procedure: PORTA CATH INSERTION;  Surgeon: Algernon Huxley, MD;  Location: [place] [facility];  Service: Cardiovascular;  Laterality: N/A;  . TONSILLECTOMY      Prior to Admission medications   Medication Sig Start Date End Date Taking? Authorizing Provider  chlorhexidine (PERIDEX) 0.12 % solution Use as directed 15 mLs in the mouth or throat 2 (two) times daily. 05/08/19   Rudene Re, MD  doxycycline (VIBRAMYCIN) 100 MG capsule Take 1 capsule (100 mg total) by mouth 2 (two) times daily for 7 days. 05/08/19 05/15/19  Rudene Re, MD  lidocaine (XYLOCAINE) 2 % solution Use as directed 15 mLs in the mouth or throat as needed for mouth pain. 05/08/19   Rudene Re, MD    Allergies Augmentin [amoxicillin-pot clavulanate] and Sulfamethoxazole-trimethoprim  Family History  Problem Relation Age of Onset    . Hypertension Mother   . Diabetes Mother     Social History Social History   Tobacco Use  . Smoking status: Never Smoker  . Smokeless tobacco: Never Used  Substance Use Topics  . Alcohol use: No  . Drug use: No    Review of Systems  Constitutional: Negative for fever. Eyes: Negative for visual changes. ENT: Negative for sore throat. + gum pain Neck: No neck pain  Cardiovascular: Negative for chest pain. Respiratory: Negative for shortness of breath. Gastrointestinal: Negative for abdominal pain, vomiting or diarrhea. Genitourinary: Negative for dysuria. Musculoskeletal: Negative for back pain. Skin: Negative for rash. Neurological: Negative for headaches, weakness or numbness. Psych: No SI or HI  ____________________________________________   PHYSICAL EXAM:  VITAL SIGNS: ED Triage Vitals [05/08/19 0338]  Enc Vitals Group     BP (!) 161/86     Pulse Rate 78     Resp 18     Temp 98.7 F (37.1 C)     Temp Source Oral     SpO2 100 %     Weight (!) 360 lb (163.3 kg)     Height 5\' 6"  (1.676 m)     Head Circumference      Peak Flow      Pain Score 9     Pain Loc      Pain Edu?      Excl. in [place]?     Constitutional: Alert and oriented. Well appearing and in no apparent distress. HEENT:      Head: Normocephalic and atraumatic.  Eyes: Conjunctivae are normal. Sclera is non-icteric.       Mouth/Throat: Mucous membranes are moist.  Mild gingivitis of the right lower gum with no abscess, no tooth infection.,  No trismus, floor of the mouth is soft, no induration, no peritonsillar abscess, oropharynx is otherwise clear with normal tonsils and uvula.      Neck: Supple with no signs of meningismus.  No swelling of the neck or induration. Cardiovascular: Regular rate and rhythm.  Respiratory: Normal respiratory effort.  Musculoskeletal: No edema, cyanosis, or erythema of extremities. Neurologic: Normal speech and language. Face is symmetric. Moving all  extremities. No gross focal neurologic deficits are appreciated. Skin: Skin is warm, dry and intact. No rash noted. Psychiatric: Mood and affect are normal. Speech and behavior are normal.  ____________________________________________   LABS (all labs ordered are listed, but only abnormal results are displayed)  Labs Reviewed - No data to display ____________________________________________  EKG  none  ____________________________________________  RADIOLOGY  none  ____________________________________________   PROCEDURES  Procedure(s) performed: None Procedures Critical Care performed:  None ____________________________________________   INITIAL IMPRESSION / ASSESSMENT AND PLAN / ED COURSE   29 y.o. male who presents for evaluation of gum pain.  Exam consistent with gingivitis.  No evidence of dental abscess, parotitis, Ludwig's angina, or any other active infection.  Will discharge home on chlorhexidine, doxycycline, viscous lidocaine for pain and referred to dentist.  Discussed my standard return precautions for   I have reviewed patient's previous medical records and PMH.       _____________________________________________ Please note:  Patient was evaluated in Emergency Department today for the symptoms described in the history of present illness. Patient was evaluated in the context of the global COVID-19 pandemic, which necessitated consideration that the patient might be at risk for infection with the SARS-CoV-2 virus that causes COVID-19. Institutional protocols and algorithms that pertain to the evaluation of patients at risk for COVID-19 are in a state of rapid change based on information released by regulatory bodies including the CDC and federal and state organizations. These policies and algorithms were followed during the patient's care in the ED.  Some ED evaluations and interventions may be delayed as a result of limited staffing during the  pandemic.   ____________________________________________   FINAL CLINICAL IMPRESSION(S) / ED DIAGNOSES   Final diagnoses:  Gingivitis      NEW MEDICATIONS STARTED DURING THIS VISIT:  ED Discharge Orders         Ordered    doxycycline (VIBRAMYCIN) 100 MG capsule  2 times daily     05/08/19 0504    chlorhexidine (PERIDEX) 0.12 % solution  2 times daily     05/08/19 0504    lidocaine (XYLOCAINE) 2 % solution  As needed     05/08/19 3532           Note:  This document was prepared using Dragon voice recognition software and may include unintentional dictation errors.    Don Perking, [place], MD 05/08/19 [phone]

## 2019-05-11 NOTE — Progress Notes (Signed)
[place]  [phone] Fax:([phone]) [phone]  ID: Samuel Rojas OB: [birth year]  MR#: [id number]  XYI#:[id number]  Patient Care Team: [facility] [place] as PCP - General (General Practice) Lloyd Huger, MD as Consulting Physician (Hematology and Oncology)  I connected with Samuel Rojas on 05/17/19 at  2:00 PM EDT by video enabled telemedicine visit and verified that I am speaking with the correct person using two identifiers.   I discussed the limitations, risks, security and privacy concerns of performing an evaluation and management service by telemedicine and the availability of in-person appointments. I also discussed with the patient that there may be a patient responsible charge related to this service. The patient expressed understanding and agreed to proceed.   Other persons participating in the visit and their role in the encounter: Patient, MD.  Patient's location: Home. Provider's location: Clinic.  CHIEF COMPLAINT: ITP.  INTERVAL HISTORY: Patient agreed to video enabled telemedicine visit for further evaluation and discussion of his laboratory work.  He continues to feel well and remains asymptomatic. He denies any easy bleeding or bruising. He has no neurologic complaints. He denies any recent fevers or illnesses. He has a good appetite and denies weight loss.  He has no chest pain, shortness of breath, cough, or hemoptysis.  He denies any nausea, vomiting, constipation, or diarrhea. He has no urinary complaints.  Patient offers no specific complaints today.  REVIEW OF SYSTEMS:   Review of Systems  Constitutional: Negative.  Negative for fever, malaise/fatigue and weight loss.  HENT: Negative.  Negative for nosebleeds.   Respiratory: Negative.  Negative for cough, hemoptysis and shortness of breath.   Cardiovascular: Negative.  Negative for chest pain and leg swelling.  Gastrointestinal: Negative.   Negative for abdominal pain, blood in stool and melena.  Genitourinary: Negative.  Negative for hematuria.  Musculoskeletal: Negative.   Skin: Negative.  Negative for rash.  Neurological: Negative.  Negative for sensory change, focal weakness and weakness.  Endo/Heme/Allergies: Does not bruise/bleed easily.  Psychiatric/Behavioral: Negative.  The patient is not nervous/anxious.     As per HPI. Otherwise, a complete review of systems is negative.  PAST MEDICAL HISTORY: Past Medical History:  Diagnosis Date  . Cellulitis    left leg  . Idiopathic thrombocytopenic purpura (ITP) (HCC)     PAST SURGICAL HISTORY: Past Surgical History:  Procedure Laterality Date  . PORTA CATH INSERTION N/A 09/20/2017   Procedure: PORTA CATH INSERTION;  Surgeon: Algernon Huxley, MD;  Location: [place] [facility];  Service: Cardiovascular;  Laterality: N/A;  . TONSILLECTOMY      FAMILY HISTORY: Family History  Problem Relation Age of Onset  . Hypertension Mother   . Diabetes Mother        ADVANCED DIRECTIVES:    HEALTH MAINTENANCE: Social History   Tobacco Use  . Smoking status: Never Smoker  . Smokeless tobacco: Never Used  Substance Use Topics  . Alcohol use: No  . Drug use: No     Colonoscopy:  PAP:  Bone density:  Lipid panel:  Allergies  Allergen Reactions  . Augmentin [Amoxicillin-Pot Clavulanate] Hives  . Sulfamethoxazole-Trimethoprim     Dehydration, confusion , high BP    Current Outpatient Medications  Medication Sig Dispense Refill  . chlorhexidine (PERIDEX) 0.12 % solution Use as directed 15 mLs in the mouth or throat 2 (two) times daily. (Patient not taking: Reported on 05/16/2019) 120 mL 0  . lidocaine (XYLOCAINE) 2 % solution Use  as directed 15 mLs in the mouth or throat as needed for mouth pain. (Patient not taking: Reported on 05/16/2019) 50 mL 0   No current facility-administered medications for this visit.   Facility-Administered Medications Ordered in Other  Visits  Medication Dose Route Frequency Provider Last Rate Last Admin  . famotidine (PEPCID) IVPB 20 mg premix  20 mg Intravenous Q12H Lloyd Huger, MD   Stopped at 10/01/17 1001  . sodium chloride flush (NS) 0.9 % injection 10 mL  10 mL Intracatheter PRN Lloyd Huger, MD   10 mL at 10/01/17 0855    OBJECTIVE: There were no vitals filed for this visit.   There is no height or weight on file to calculate BMI.    ECOG FS:0 - Asymptomatic  General: Well-developed, well-nourished, no acute distress. HEENT: Normocephalic. Neuro: Alert, answering all questions appropriately. Cranial nerves grossly intact. Psych: Normal affect.   LAB RESULTS:  Lab Results  Component Value Date   NA 138 08/02/2018   K 3.9 08/02/2018   CL 105 08/02/2018   CO2 25 08/02/2018   GLUCOSE 100 (H) 08/02/2018   BUN 13 08/02/2018   CREATININE 0.77 08/02/2018   CALCIUM 8.5 (L) 08/02/2018   PROT 7.1 08/02/2018   ALBUMIN 3.8 08/02/2018   AST 23 08/02/2018   ALT 36 08/02/2018   ALKPHOS 76 08/02/2018   BILITOT 0.6 08/02/2018   GFRNONAA >60 08/02/2018   GFRAA >60 08/02/2018    Lab Results  Component Value Date   WBC 6.3 05/15/2019   NEUTROABS 4.0 05/15/2019   HGB 13.5 05/15/2019   HCT 41.9 05/15/2019   MCV 77.9 (L) 05/15/2019   PLT 78 (L) 05/15/2019   Lab Results  Component Value Date   IRON 50 09/09/2015   TIBC 320 09/09/2015   IRONPCTSAT 16 (L) 09/09/2015   Lab Results  Component Value Date   FERRITIN 75 09/09/2015     STUDIES: No results found.  ASSESSMENT: ITP.  PLAN:     1.  ITP: Previously, patient received 5 days of 100 mg prednisone with an excellent response to his platelets increasing to greater than 200.  Unfortunately, response was not durable. 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  Splenic ultrasound did not reveal any evidence of splenomegaly.  Bone marrow biopsy was not completed secondary to technical difficulties  with patient's body habitus.  Patient completed his first round of weekly Rituxan x4 on October 08, 2017.  He then completed his second round of weekly Rituxan on September 09, 2018.  Patient's platelet count remains chronically decreased, but has trended up slightly to 78.  No intervention is needed at this time.  Return to clinic in 2 months for laboratory work only and then in 4 months for laboratory work and video assisted telemedicine visit.   2.  Poor venous access: Patient now has a port.  Continue port flushes every 6-8 weeks. 3.  Nontraumatic rhabdomyolysis: Resolved.  CK levels are within normal limits.  I provided 20 minutes of face-to-face video visit time during this encounter which included chart review, counseling, and coordination of care as documented above.   Patient expressed understanding and was in agreement with this plan. He also understands that He can call clinic at any time with any questions, concerns, or complaints.    Lloyd Huger, MD   05/17/2019 1:35 PM

## 2019-05-15 ENCOUNTER — Inpatient Hospital Stay: Payer: BC Managed Care – PPO | Attending: Oncology

## 2019-05-15 ENCOUNTER — Other Ambulatory Visit: Payer: Self-pay

## 2019-05-15 DIAGNOSIS — Z452 Encounter for adjustment and management of vascular access device: Secondary | ICD-10-CM | POA: Diagnosis not present

## 2019-05-15 DIAGNOSIS — Z95828 Presence of other vascular implants and grafts: Secondary | ICD-10-CM

## 2019-05-15 DIAGNOSIS — D693 Immune thrombocytopenic purpura: Secondary | ICD-10-CM | POA: Insufficient documentation

## 2019-05-15 DIAGNOSIS — D696 Thrombocytopenia, unspecified: Secondary | ICD-10-CM

## 2019-05-15 LAB — CBC WITH DIFFERENTIAL/PLATELET
Abs Immature Granulocytes: 0.01 10*3/uL (ref 0.00–0.07)
Basophils Absolute: 0 10*3/uL (ref 0.0–0.1)
Basophils Relative: 0 %
Eosinophils Absolute: 0.1 10*3/uL (ref 0.0–0.5)
Eosinophils Relative: 2 %
HCT: 41.9 % (ref 39.0–52.0)
Hemoglobin: 13.5 g/dL (ref 13.0–17.0)
Immature Granulocytes: 0 %
Lymphocytes Relative: 29 %
Lymphs Abs: 1.8 10*3/uL (ref 0.7–4.0)
MCH: 25.1 pg — ABNORMAL LOW (ref 26.0–34.0)
MCHC: 32.2 g/dL (ref 30.0–36.0)
MCV: 77.9 fL — ABNORMAL LOW (ref 80.0–100.0)
Monocytes Absolute: 0.4 10*3/uL (ref 0.1–1.0)
Monocytes Relative: 7 %
Neutro Abs: 4 10*3/uL (ref 1.7–7.7)
Neutrophils Relative %: 62 %
Platelets: 78 10*3/uL — ABNORMAL LOW (ref 150–400)
RBC: 5.38 MIL/uL (ref 4.22–5.81)
RDW: 13.8 % (ref 11.5–15.5)
WBC: 6.3 10*3/uL (ref 4.0–10.5)
nRBC: 0 % (ref 0.0–0.2)

## 2019-05-15 MED ORDER — HEPARIN SOD (PORK) LOCK FLUSH 100 UNIT/ML IV SOLN
500.0000 [IU] | Freq: Once | INTRAVENOUS | Status: AC
  Administered 2019-05-15: 14:00:00 500 [IU] via INTRAVENOUS
  Filled 2019-05-15: qty 5

## 2019-05-15 MED ORDER — SODIUM CHLORIDE 0.9% FLUSH
10.0000 mL | Freq: Once | INTRAVENOUS | Status: AC
  Administered 2019-05-15: 10 mL via INTRAVENOUS
  Filled 2019-05-15: qty 10

## 2019-05-16 ENCOUNTER — Encounter: Payer: Self-pay | Admitting: Oncology

## 2019-05-16 ENCOUNTER — Inpatient Hospital Stay (HOSPITAL_BASED_OUTPATIENT_CLINIC_OR_DEPARTMENT_OTHER): Payer: BC Managed Care – PPO | Admitting: Oncology

## 2019-05-16 DIAGNOSIS — D693 Immune thrombocytopenic purpura: Secondary | ICD-10-CM

## 2019-05-16 NOTE — Progress Notes (Signed)
Patient prescreened for appointment. Patient has no concerns or questions.  

## 2019-06-29 ENCOUNTER — Emergency Department
Admission: EM | Admit: 2019-06-29 | Discharge: 2019-06-29 | Disposition: A | Discharge status: Home / Self Care | Payer: BC Managed Care – PPO | Attending: Emergency Medicine | Admitting: Emergency Medicine

## 2019-06-29 ENCOUNTER — Encounter: Payer: Self-pay | Admitting: Emergency Medicine

## 2019-06-29 ENCOUNTER — Other Ambulatory Visit: Payer: Self-pay

## 2019-06-29 DIAGNOSIS — D693 Immune thrombocytopenic purpura: Secondary | ICD-10-CM | POA: Insufficient documentation

## 2019-06-29 DIAGNOSIS — R040 Epistaxis: Secondary | ICD-10-CM | POA: Diagnosis not present

## 2019-06-29 LAB — BASIC METABOLIC PANEL
Anion gap: 8 (ref 5–15)
BUN: 12 mg/dL (ref 6–20)
CO2: 26 mmol/L (ref 22–32)
Calcium: 8.9 mg/dL (ref 8.9–10.3)
Chloride: 107 mmol/L (ref 98–111)
Creatinine, Ser: 0.76 mg/dL (ref 0.61–1.24)
GFR calc Af Amer: >60 mL/min (ref >60)
GFR calc non Af Amer: >60 mL/min (ref >60)
Glucose, Bld: 110 mg/dL — ABNORMAL HIGH (ref 70–99)
Potassium: 3.7 mmol/L (ref 3.5–5.1)
Sodium: 141 mmol/L (ref 135–145)

## 2019-06-29 LAB — CBC
HCT: 48 % (ref 39.0–52.0)
Hemoglobin: 15.5 g/dL (ref 13.0–17.0)
MCH: 25.4 pg — ABNORMAL LOW (ref 26.0–34.0)
MCHC: 32.3 g/dL (ref 30.0–36.0)
MCV: 78.7 fL — ABNORMAL LOW (ref 80.0–100.0)
Platelets: 54 10*3/uL — ABNORMAL LOW (ref 150–400)
RBC: 6.1 MIL/uL — ABNORMAL HIGH (ref 4.22–5.81)
RDW: 14.1 % (ref 11.5–15.5)
WBC: 5.5 10*3/uL (ref 4.0–10.5)
nRBC: 0 % (ref 0.0–0.2)

## 2019-06-29 MED ORDER — OXYMETAZOLINE HCL 0.05 % NA SOLN
3.0000 | Freq: Once | NASAL | Status: AC
  Administered 2019-06-29: 3 via NASAL
  Filled 2019-06-29: qty 30

## 2019-06-29 NOTE — ED Notes (Signed)
MD to bedside to update patient.

## 2019-06-29 NOTE — ED Notes (Signed)
Patient discharged to home per MD order. Patient in stable condition, and deemed medically cleared by ED provider for discharge. Discharge instructions reviewed with patient/family using "Teach Back"; verbalized understanding of medication education and administration, and information about follow-up care. Denies further concerns. ° °

## 2019-06-29 NOTE — ED Triage Notes (Addendum)
Patient ambulatory to triage with steady gait, without difficulty or distress noted; pt reports left sided nosebleed since 3am; st recent nasal congestion; denies any accomp symptoms; nose clamp applied

## 2019-06-29 NOTE — ED Notes (Signed)
Clip remains in place. Pt continues to feel as though nose is bleeding.

## 2019-06-29 NOTE — ED Notes (Signed)
Pt resting comfortably, no bleeding noted at this time.

## 2019-06-29 NOTE — ED Provider Notes (Signed)
[facility] Emergency Department Provider Note  Time seen: 5:16 AM  I have reviewed the triage vital signs and the nursing notes.   HISTORY  Chief Complaint Epistaxis   HPI Samuel Rojas is a 29 y.o. male with a past medical history of ITP presents to the emergency department for epistaxis.  According to the patient he has a history of epistaxis usually due to his ITP and low platelets.  States over the past few days he has been feeling some nasal congestion like an upper respiratory infection.  Was tested for Covid and test came back negative.  Patient states he woke around 230 this morning and found his nose was bleeding.  States he usually has difficulty stopping his nosebleeds due to his ITP so he came to the emergency department.  Here the patient appears well, nasal clamp applied upon arrival.  Patient denies any fever.  Largely negative review of systems otherwise.   Past Medical History:  Diagnosis Date  . Cellulitis    left leg  . Idiopathic thrombocytopenic purpura (ITP) [facility])     Patient Active Problem List   Diagnosis Date Noted  . Chronic ITP (idiopathic thrombocytopenic purpura) (HCC) 08/26/2018  . Goals of care, counseling/discussion 08/13/2018  . Thrombocytopenia ([place]) 09/09/2015  . Cellulitis 01/16/2014    Past Surgical History:  Procedure Laterality Date  . PORTA CATH INSERTION N/A 09/20/2017   Procedure: PORTA CATH INSERTION;  Surgeon: Algernon Huxley, MD;  Location: [place] [facility];  Service: Cardiovascular;  Laterality: N/A;  . TONSILLECTOMY      Prior to Admission medications   Medication Sig Start Date End Date Taking? Authorizing Provider  chlorhexidine (PERIDEX) 0.12 % solution Use as directed 15 mLs in the mouth or throat 2 (two) times daily. Patient not taking: Reported on 05/16/2019 05/08/19   Rudene Re, MD  lidocaine (XYLOCAINE) 2 % solution Use as directed 15 mLs in the mouth or throat as needed for mouth  pain. Patient not taking: Reported on 05/16/2019 05/08/19   Rudene Re, MD    Allergies  Allergen Reactions  . Augmentin [Amoxicillin-Pot Clavulanate] Hives  . Sulfamethoxazole-Trimethoprim     Dehydration, confusion , high BP    Family History  Problem Relation Age of Onset  . Hypertension Mother   . Diabetes Mother     Social History Social History   Tobacco Use  . Smoking status: Never Smoker  . Smokeless tobacco: Never Used  Substance Use Topics  . Alcohol use: No  . Drug use: No    Review of Systems Constitutional: Negative for fever. ENT: Positive for nasal congestion.  Positive for epistaxis Cardiovascular: Negative for chest pain. Respiratory: Negative for shortness of breath.  Mild cough Gastrointestinal: Negative for abdominal pain Musculoskeletal: Negative for musculoskeletal complaints Neurological: Negative for headache All other ROS negative  ____________________________________________   PHYSICAL EXAM:  VITAL SIGNS: ED Triage Vitals  Enc Vitals Group     BP 06/29/19 0359 (!) 157/83     Pulse Rate 06/29/19 0359 100     Resp 06/29/19 0359 20     Temp 06/29/19 0359 99 F (37.2 C)     Temp Source 06/29/19 0359 Oral     SpO2 06/29/19 0359 98 %     Weight 06/29/19 0357 (!) 360 lb (163.3 kg)     Height 06/29/19 0357 5\' 6"  (1.676 m)     Head Circumference --      Peak Flow --  Pain Score 06/29/19 0357 0     Pain Loc --      Pain Edu? --      Excl. in [place]? --    Constitutional: Alert and oriented. Well appearing and in no distress. Eyes: Normal exam ENT      Head: Normocephalic and atraumatic.      Nose: Dried blood in right nostril, wet appearing blood in left nostril but not actively bleeding.      Mouth/Throat: Mucous membranes are moist.  No blood running down the throat. Cardiovascular: Normal rate, regular rhythm. No murmurs, rubs, or gallops. Respiratory: Normal respiratory effort without tachypnea nor retractions. Breath  sounds are clear  Gastrointestinal: Soft and nontender. No distention.   Musculoskeletal: Nontender with normal range of motion in all extremities.  Neurologic:  Normal speech and language. No gross focal neurologic deficits  Skin:  Skin is warm, dry and intact.  Psychiatric: Mood and affect are normal.    INITIAL IMPRESSION / ASSESSMENT AND PLAN / ED COURSE  Pertinent labs & imaging results that were available during my care of the patient were reviewed by me and considered in my medical decision making (see chart for details).   Patient presents to the emergency department with a nosebleed.  Has a history of ITP.  We will check lab work to check platelet count.  Does not appear to be actively bleeding at this time we will spray Afrin and reclamp.  We will continue to monitor closely in the emergency department.  Overall the patient appears quite well.  Patient's platelet count is 54,000.  Remains hemostatic.  We will discharge home.  [name] was evaluated in Emergency Department on 06/29/2019 for the symptoms described in the history of present illness. He was evaluated in the context of the global COVID-19 pandemic, which necessitated consideration that the patient might be at risk for infection with the SARS-CoV-2 virus that causes COVID-19. Institutional protocols and algorithms that pertain to the evaluation of patients at risk for COVID-19 are in a state of rapid change based on information released by regulatory bodies including the CDC and federal and state organizations. These policies and algorithms were followed during the patient's care in the ED.  ____________________________________________   FINAL CLINICAL IMPRESSION(S) / ED DIAGNOSES  Epistaxis   Minna Antis, MD 06/29/19 [phone]

## 2019-07-17 ENCOUNTER — Other Ambulatory Visit: Payer: Self-pay

## 2019-07-17 ENCOUNTER — Inpatient Hospital Stay: Payer: BC Managed Care – PPO | Attending: Oncology

## 2019-07-17 DIAGNOSIS — D696 Thrombocytopenia, unspecified: Secondary | ICD-10-CM

## 2019-07-17 DIAGNOSIS — Z95828 Presence of other vascular implants and grafts: Secondary | ICD-10-CM

## 2019-07-17 DIAGNOSIS — Z452 Encounter for adjustment and management of vascular access device: Secondary | ICD-10-CM | POA: Insufficient documentation

## 2019-07-17 DIAGNOSIS — D693 Immune thrombocytopenic purpura: Secondary | ICD-10-CM | POA: Insufficient documentation

## 2019-07-17 LAB — CBC WITH DIFFERENTIAL/PLATELET
Abs Immature Granulocytes: 0.01 10*3/uL (ref 0.00–0.07)
Basophils Absolute: 0 10*3/uL (ref 0.0–0.1)
Basophils Relative: 0 %
Eosinophils Absolute: 0.2 10*3/uL (ref 0.0–0.5)
Eosinophils Relative: 2 %
HCT: 39.3 % (ref 39.0–52.0)
Hemoglobin: 12.9 g/dL — ABNORMAL LOW (ref 13.0–17.0)
Immature Granulocytes: 0 %
Lymphocytes Relative: 22 %
Lymphs Abs: 1.6 10*3/uL (ref 0.7–4.0)
MCH: 25.6 pg — ABNORMAL LOW (ref 26.0–34.0)
MCHC: 32.8 g/dL (ref 30.0–36.0)
MCV: 78.1 fL — ABNORMAL LOW (ref 80.0–100.0)
Monocytes Absolute: 0.3 10*3/uL (ref 0.1–1.0)
Monocytes Relative: 4 %
Neutro Abs: 5.1 10*3/uL (ref 1.7–7.7)
Neutrophils Relative %: 72 %
Platelets: 77 10*3/uL — ABNORMAL LOW (ref 150–400)
RBC: 5.03 MIL/uL (ref 4.22–5.81)
RDW: 14.1 % (ref 11.5–15.5)
WBC: 7.2 10*3/uL (ref 4.0–10.5)
nRBC: 0 % (ref 0.0–0.2)

## 2019-07-17 MED ORDER — HEPARIN SOD (PORK) LOCK FLUSH 100 UNIT/ML IV SOLN
500.0000 [IU] | Freq: Once | INTRAVENOUS | Status: AC
  Administered 2019-07-17: 500 [IU] via INTRAVENOUS
  Filled 2019-07-17: qty 5

## 2019-07-17 MED ORDER — SODIUM CHLORIDE 0.9% FLUSH
10.0000 mL | Freq: Once | INTRAVENOUS | Status: AC
  Administered 2019-07-17: 10 mL via INTRAVENOUS
  Filled 2019-07-17: qty 10

## 2019-07-17 MED ORDER — HEPARIN SOD (PORK) LOCK FLUSH 100 UNIT/ML IV SOLN
INTRAVENOUS | Status: AC
  Filled 2019-07-17: qty 5

## 2019-08-25 ENCOUNTER — Other Ambulatory Visit: Payer: Self-pay

## 2019-08-25 ENCOUNTER — Observation Stay
Admission: EM | Admit: 2019-08-25 | Discharge: 2019-08-26 | Disposition: A | Discharge status: Home / Self Care | Payer: BC Managed Care – PPO | Attending: Emergency Medicine | Admitting: Emergency Medicine

## 2019-08-25 DIAGNOSIS — E876 Hypokalemia: Secondary | ICD-10-CM | POA: Diagnosis not present

## 2019-08-25 DIAGNOSIS — M6282 Rhabdomyolysis: Secondary | ICD-10-CM | POA: Diagnosis not present

## 2019-08-25 DIAGNOSIS — Z20822 Contact with and (suspected) exposure to covid-19: Secondary | ICD-10-CM | POA: Insufficient documentation

## 2019-08-25 DIAGNOSIS — D693 Immune thrombocytopenic purpura: Secondary | ICD-10-CM

## 2019-08-25 DIAGNOSIS — R030 Elevated blood-pressure reading, without diagnosis of hypertension: Secondary | ICD-10-CM | POA: Diagnosis not present

## 2019-08-25 LAB — URINALYSIS, COMPLETE (UACMP) WITH MICROSCOPIC
Bilirubin Urine: NEGATIVE
Glucose, UA: NEGATIVE mg/dL
Ketones, ur: NEGATIVE mg/dL
Leukocytes,Ua: NEGATIVE
Nitrite: NEGATIVE
Protein, ur: NEGATIVE mg/dL
Specific Gravity, Urine: 1.026 (ref 1.005–1.030)
pH: 5 (ref 5.0–8.0)

## 2019-08-25 LAB — CBC WITH DIFFERENTIAL/PLATELET
Abs Immature Granulocytes: 0.01 10*3/uL (ref 0.00–0.07)
Basophils Absolute: 0 10*3/uL (ref 0.0–0.1)
Basophils Relative: 0 %
Eosinophils Absolute: 0.2 10*3/uL (ref 0.0–0.5)
Eosinophils Relative: 3 %
HCT: 45.9 % (ref 39.0–52.0)
Hemoglobin: 15.2 g/dL (ref 13.0–17.0)
Immature Granulocytes: 0 %
Lymphocytes Relative: 29 %
Lymphs Abs: 1.5 10*3/uL (ref 0.7–4.0)
MCH: 25.5 pg — ABNORMAL LOW (ref 26.0–34.0)
MCHC: 33.1 g/dL (ref 30.0–36.0)
MCV: 77 fL — ABNORMAL LOW (ref 80.0–100.0)
Monocytes Absolute: 0.3 10*3/uL (ref 0.1–1.0)
Monocytes Relative: 6 %
Neutro Abs: 3.2 10*3/uL (ref 1.7–7.7)
Neutrophils Relative %: 62 %
Platelets: 26 10*3/uL — CL (ref 150–400)
RBC: 5.96 MIL/uL — ABNORMAL HIGH (ref 4.22–5.81)
RDW: 14 % (ref 11.5–15.5)
WBC: 5.1 10*3/uL (ref 4.0–10.5)
nRBC: 0 % (ref 0.0–0.2)

## 2019-08-25 LAB — CK
Total CK: 4110 U/L — ABNORMAL HIGH (ref 49–397)
Total CK: 3723 U/L — ABNORMAL HIGH (ref 49–397)

## 2019-08-25 LAB — COMPREHENSIVE METABOLIC PANEL
ALT: 37 U/L (ref 0–44)
AST: 70 U/L — ABNORMAL HIGH (ref 15–41)
Albumin: 4.4 g/dL (ref 3.5–5.0)
Alkaline Phosphatase: 72 U/L (ref 38–126)
Anion gap: 9 (ref 5–15)
BUN: 14 mg/dL (ref 6–20)
CO2: 27 mmol/L (ref 22–32)
Calcium: 8.9 mg/dL (ref 8.9–10.3)
Chloride: 104 mmol/L (ref 98–111)
Creatinine, Ser: 0.95 mg/dL (ref 0.61–1.24)
GFR calc Af Amer: >60 mL/min (ref >60)
GFR calc non Af Amer: >60 mL/min (ref >60)
Glucose, Bld: 85 mg/dL (ref 70–99)
Potassium: 3.4 mmol/L — ABNORMAL LOW (ref 3.5–5.1)
Sodium: 140 mmol/L (ref 135–145)
Total Bilirubin: 0.8 mg/dL (ref 0.3–1.2)
Total Protein: 7.9 g/dL (ref 6.5–8.1)

## 2019-08-25 LAB — MAGNESIUM: Magnesium: 2 mg/dL (ref 1.7–2.4)

## 2019-08-25 LAB — SARS CORONAVIRUS 2 BY RT PCR (HOSPITAL ORDER, PERFORMED IN ~~LOC~~ HOSPITAL LAB): SARS Coronavirus 2: NEGATIVE

## 2019-08-25 MED ORDER — ONDANSETRON HCL 4 MG/2ML IJ SOLN: 4.0000 mg | Freq: Four times a day (QID) | INTRAMUSCULAR | Status: DC | PRN

## 2019-08-25 MED ORDER — POTASSIUM CHLORIDE 20 MEQ PO PACK
40.0000 meq | PACK | Freq: Once | ORAL | Status: AC
  Administered 2019-08-25: 40 meq via ORAL
  Filled 2019-08-25: qty 2

## 2019-08-25 MED ORDER — SODIUM CHLORIDE 0.9 % IV BOLUS
1000.0000 mL | Freq: Once | INTRAVENOUS | Status: AC
  Administered 2019-08-25: 1000 mL via INTRAVENOUS

## 2019-08-25 MED ORDER — MAGIC MOUTHWASH: ORAL | 0 refills | Status: DC

## 2019-08-25 MED ORDER — SODIUM CHLORIDE 0.9 % IV SOLN: Freq: Once | INTRAVENOUS | Status: DC

## 2019-08-25 MED ORDER — ACETAMINOPHEN 650 MG RE SUPP: 650.0000 mg | Freq: Four times a day (QID) | RECTAL | Status: DC | PRN

## 2019-08-25 MED ORDER — KETOROLAC TROMETHAMINE 15 MG/ML IJ SOLN: 15.0000 mg | Freq: Four times a day (QID) | INTRAMUSCULAR | Status: DC | PRN

## 2019-08-25 MED ORDER — ONDANSETRON HCL 4 MG PO TABS: 4.0000 mg | ORAL_TABLET | Freq: Four times a day (QID) | ORAL | Status: DC | PRN

## 2019-08-25 MED ORDER — TRAZODONE HCL 50 MG PO TABS
25.0000 mg | ORAL_TABLET | Freq: Every evening | ORAL | Status: DC | PRN
  Administered 2019-08-25: 25 mg via ORAL
  Filled 2019-08-25: qty 1

## 2019-08-25 MED ORDER — MAGNESIUM HYDROXIDE 400 MG/5ML PO SUSP: 30.0000 mL | Freq: Every day | ORAL | Status: DC | PRN

## 2019-08-25 MED ORDER — ENOXAPARIN SODIUM 40 MG/0.4ML ~~LOC~~ SOLN: 40.0000 mg | SUBCUTANEOUS | Status: DC

## 2019-08-25 MED ORDER — SODIUM CHLORIDE 0.9 % IV SOLN: INTRAVENOUS | Status: DC

## 2019-08-25 MED ORDER — ACETAMINOPHEN 325 MG PO TABS: 650.0000 mg | ORAL_TABLET | Freq: Four times a day (QID) | ORAL | Status: DC | PRN

## 2019-08-25 MED ORDER — LABETALOL HCL 5 MG/ML IV SOLN: 20.0000 mg | INTRAVENOUS | Status: DC | PRN

## 2019-08-25 NOTE — ED Triage Notes (Signed)
Pt arrives to ER c/o of generalized muscle soreness. Denies fever. Symptoms began today. Denies doing anything yesterday. A&O, ambulatory.

## 2019-08-25 NOTE — ED Provider Notes (Signed)
[facility] Emergency Department Provider Note  ____________________________________________   First MD Initiated Contact with Patient 08/25/19 1605     (approximate)  I have reviewed the triage vital signs and the nursing notes.   HISTORY  Chief Complaint Generalized Body Aches    HPI Samuel Rojas is a 29 y.o. male presents to the ED after waking up stating that his muscles are sore.  He denies any fever or chills.  He states that he also has ulcers on his tongue.  He denies any change in taste or smell or exposure to anyone with Covid.  No cough, vomiting or diarrhea.  Patient shows discharge information in my chart about rhabdomyolysis which he was diagnosed last year and is concerned that this is what is going on this year.  He denies any excessive exercise, yard work, walking or repetitive motion.  He rates his pain as a 2 out of 10. Patient also has a history of chronic ITP and is being seen at the cancer center at [facility].       Past Medical History:  Diagnosis Date  . Cellulitis    left leg  . Idiopathic thrombocytopenic purpura (ITP) [facility])     Patient Active Problem List   Diagnosis Date Noted  . Rhabdomyolysis 08/25/2019  . Chronic ITP (idiopathic thrombocytopenic purpura) (HCC) 08/26/2018  . Goals of care, counseling/discussion 08/13/2018  . Thrombocytopenia (HCC) 09/09/2015  . Cellulitis 01/16/2014    Past Surgical History:  Procedure Laterality Date  . PORTA CATH INSERTION N/A 09/20/2017   Procedure: PORTA CATH INSERTION;  Surgeon: Annice Needy, MD;  Location: [facility] INVASIVE CV LAB;  Service: Cardiovascular;  Laterality: N/A;  . TONSILLECTOMY      Prior to Admission medications   Medication Sig Start Date End Date Taking? Authorizing Provider  chlorhexidine (PERIDEX) 0.12 % solution Use as directed 15 mLs in the mouth or throat 2 (two) times daily. Patient not taking: Reported on 05/16/2019 05/08/19    Nita Sickle, MD  lidocaine (XYLOCAINE) 2 % solution Use as directed 15 mLs in the mouth or throat as needed for mouth pain. Patient not taking: Reported on 05/16/2019 05/08/19   Nita Sickle, MD  magic mouthwash SOLN 1 to 2 teaspoons before meals and at bedtime.  Swish for approximately 5 minutes and spit. 08/25/19   Tommi Rumps, PA-C    Allergies Augmentin [amoxicillin-pot clavulanate] and Sulfamethoxazole-trimethoprim  Family History  Problem Relation Age of Onset  . Hypertension Mother   . Diabetes Mother     Social History Social History   Tobacco Use  . Smoking status: Never Smoker  . Smokeless tobacco: Never Used  Vaping Use  . Vaping Use: Never used  Substance Use Topics  . Alcohol use: No  . Drug use: No    Review of Systems Constitutional: No fever/chills Eyes: No visual changes. Cardiovascular: Denies chest pain. Respiratory: Denies shortness of breath.  Negative for cough. Gastrointestinal: No abdominal pain.  No nausea, no vomiting.  No diarrhea.   Genitourinary: Negative for dysuria.  Denies "tea colored" urine Musculoskeletal: Positive for muscle aches. Skin: Negative for rash. Neurological: Negative for headaches, focal weakness or numbness. ____________________________________________   PHYSICAL EXAM:  VITAL SIGNS: ED Triage Vitals  Enc Vitals Group     BP 08/25/19 1443 134/81     Pulse Rate 08/25/19 1443 76     Resp 08/25/19 1443 16     Temp 08/25/19 1443 99 F (37.2  C)     Temp Source 08/25/19 1443 Oral     SpO2 08/25/19 1443 96 %     Weight 08/25/19 1444 (!) 360 lb (163.3 kg)     Height 08/25/19 1444 5\' 6"  (1.676 m)     Head Circumference --      Peak Flow --      Pain Score 08/25/19 1444 2     Pain Loc --      Pain Edu? --      Excl. in GC? --     Constitutional: Alert and oriented. Well appearing and in no acute distress.  BMI 58. Eyes: Conjunctivae are normal. PERRL. EOMI. Head: Atraumatic. Nose: No  congestion/rhinnorhea. Mouth/Throat: Mucous membranes are moist.  Oropharynx non-erythematous.  Mid tongue lateral aspect bilaterally has superficial ulcerations consistent with a aphthous ulcer.  No erythema or drainage from these areas. Neck: No stridor.   Hematological/Lymphatic/Immunilogical: No cervical lymphadenopathy. Cardiovascular: Normal rate, regular rhythm. Grossly normal heart sounds.  Good peripheral circulation. Respiratory: Normal respiratory effort.  No retractions. Lungs CTAB. Gastrointestinal: Soft and nontender. No distention.  Musculoskeletal: Patient is able to move upper and lower extremities without any assistance and can get from a sitting position to standing.  His lower extremities are edematous with mild pitting edema at the ankles.  Patient states this is a chronic situation and nothing new.  Dorsal aspect of the feet is also edematous.  Patient is able move digits without any difficulty and motor sensory function intact. Neurologic:  Normal speech and language. No gross focal neurologic deficits are appreciated.   Skin:  Skin is warm, dry and intact. No rash, no discoloration or abrasions noted. Psychiatric: Mood and affect are normal. Speech and behavior are normal.  ____________________________________________   LABS (all labs ordered are listed, but only abnormal results are displayed)  Labs Reviewed  URINALYSIS, COMPLETE (UACMP) WITH MICROSCOPIC - Abnormal; Notable for the following components:      Result Value   Color, Urine YELLOW (*)    APPearance HAZY (*)    Hgb urine dipstick MODERATE (*)    Bacteria, UA RARE (*)    All other components within normal limits  CBC WITH DIFFERENTIAL/PLATELET - Abnormal; Notable for the following components:   RBC 5.96 (*)    MCV 77.0 (*)    MCH 25.5 (*)    Platelets 26 (*)    All other components within normal limits  COMPREHENSIVE METABOLIC PANEL - Abnormal; Notable for the following components:   Potassium 3.4  (*)    AST 70 (*)    All other components within normal limits  CK - Abnormal; Notable for the following components:   Total CK 4,110 (*)    All other components within normal limits  SARS CORONAVIRUS 2 BY RT PCR (HOSPITAL ORDER, PERFORMED IN [place] [facility])     PROCEDURES  Procedure(s) performed (including Critical Care):  Procedures   ____________________________________________   INITIAL IMPRESSION / ASSESSMENT AND PLAN / ED COURSE  As part of my medical decision making, I reviewed the following data within the electronic MEDICAL RECORD NUMBER Notes from prior ED visits and [place] Controlled Substance Database  29 year old male presents to the ED with complaint of muscle aches and sore on his tongue that started today when he woke up.  Patient denied any symptoms of Covid or exposure.  He denies any fever or chills.  Patient has a history of rhabdomyolysis 1 year ago.  States that this feels very  much like last year.  Abnormal labs included CK  elevated at 4,110.  Platelets 26 and urinalysis was negative however visually urine is tea colored.  After discussing this with the patient he is agreeable to be hospitalized for aggressive fluids.    ____________________________________________   FINAL CLINICAL IMPRESSION(S) / ED DIAGNOSES  Final diagnoses:  Non-traumatic rhabdomyolysis     ED Discharge Orders         Ordered    magic mouthwash SOLN  Status:  Discontinued     Reprint     08/25/19 1620    magic mouthwash SOLN  Status:  Discontinued     Reprint    Note to Pharmacy: May also use [facility] mouth wash   08/25/19 1627    magic mouthwash SOLN     Discontinue  Reprint    Note to Pharmacy: May also use [facility] mouth wash   08/25/19 1629           Note:  This document was prepared using Dragon voice recognition software and may include unintentional dictation errors.    Tommi Rumps, PA-C 08/25/19 1951    Emily Filbert, MD 08/25/19 2017

## 2019-08-25 NOTE — ED Notes (Signed)
See triage note  Presents with muscle soreness   States sx's started today  Denies any injury or fever

## 2019-08-25 NOTE — ED Notes (Signed)
PA Rhonda notified of Platelet 26.

## 2019-08-25 NOTE — Discharge Instructions (Addendum)
Keep yourself hydrated 

## 2019-08-25 NOTE — H&P (Addendum)
[place] at [facility]   PATIENT NAME: Samuel Rojas    MR#:  [id number]  DATE OF BIRTH:  23-Apr-1990  DATE OF ADMISSION:  08/25/2019  PRIMARY CARE PHYSICIAN: [facility]   REQUESTING/REFERRING PHYSICIAN: Eather Colas  CHIEF COMPLAINT:   Chief Complaint  Patient presents with  . Generalized Body Aches    HISTORY OF PRESENT ILLNESS:  Samuel Rojas  is a 29 y.o. morbidly obese African-American male with a known history of chronic ITP, who presented to the emergency room with acute onset of myalgia that started this morning when he woke up as well as tongue ulceration without loss of taste or smell.  He denied a COVID-19 exposure.  No nausea or vomiting or diarrhea or abdominal pain.  He denied any falls or injuries.  He admitted to similar episode last year when he had rhabdomyolysis without injury.  He has not been outside in hot weather or doing any walking or exercise.  He grades his pain as 2/10 in severity.  No bleeding diathesis.  He is followed by Dr. Meredeth Ide for his ITP.  Upon presentation to the emergency room, temperature was 99 vital signs otherwise were normal.  Labs revealed mild hypokalemia 3.4 and AST was 70.  CK came back 4100 and CBC was remarkable for thrombocytopenia of 26 down from 7 7 on 07/17/2019.  COVID-19 PCR came back negative and UA was unremarkable.  Patient was given 2 L bolus of IV normal saline.  He will be placed in observation and admitted-surg bed for further evaluation and management  PAST MEDICAL HISTORY:   Past Medical History:  Diagnosis Date  . Cellulitis    left leg  . Idiopathic thrombocytopenic purpura (ITP) (HCC)     PAST SURGICAL HISTORY:   Past Surgical History:  Procedure Laterality Date  . PORTA CATH INSERTION N/A 09/20/2017   Procedure: PORTA CATH INSERTION;  Surgeon: Annice Needy, MD;  Location: [facility] INVASIVE CV LAB;  Service: Cardiovascular;  Laterality: N/A;  .  TONSILLECTOMY      SOCIAL HISTORY:   Social History   Tobacco Use  . Smoking status: Never Smoker  . Smokeless tobacco: Never Used  Substance Use Topics  . Alcohol use: No    FAMILY HISTORY:   Family History  Problem Relation Age of Onset  . Hypertension Mother   . Diabetes Mother     DRUG ALLERGIES:   Allergies  Allergen Reactions  . Augmentin [Amoxicillin-Pot Clavulanate] Hives  . Sulfamethoxazole-Trimethoprim     Dehydration, confusion , high BP    REVIEW OF SYSTEMS:   ROS As per history of present illness. All pertinent systems were reviewed above. Constitutional, HEENT, cardiovascular, respiratory, GI, GU, musculoskeletal, neuro, psychiatric, endocrine, integumentary and hematologic systems were reviewed and are otherwise negative/unremarkable except for positive findings mentioned above in the HPI.  MEDICATIONS AT HOME:   Prior to Admission medications   Medication Sig Start Date End Date Taking? Authorizing Provider  chlorhexidine (PERIDEX) 0.12 % solution Use as directed 15 mLs in the mouth or throat 2 (two) times daily. Patient not taking: Reported on 05/16/2019 05/08/19   Nita Sickle, MD  lidocaine (XYLOCAINE) 2 % solution Use as directed 15 mLs in the mouth or throat as needed for mouth pain. Patient not taking: Reported on 05/16/2019 05/08/19   Nita Sickle, MD  magic mouthwash SOLN 1 to 2 teaspoons before meals and at bedtime.  Swish for approximately 5 minutes  and spit. 08/25/19   Tommi Rumps, PA-C      VITAL SIGNS:  Blood pressure 134/81, pulse 76, temperature 99 F (37.2 C), temperature source Oral, resp. rate 16, height 5\' 6"  (1.676 m), weight (!) 163.3 kg, SpO2 96 %.  PHYSICAL EXAMINATION:  Physical Exam  GENERAL:  29 y.o.-year-old morbidly obese African-American male patient lying in the bed with no acute distress.  EYES: Pupils equal, round, reactive to light and accommodation. No scleral icterus. Extraocular muscles intact.    HEENT: Head atraumatic, normocephalic. Oropharynx and nasopharynx clear.  NECK:  Supple, no jugular venous distention. No thyroid enlargement, no tenderness.  LUNGS: Normal breath sounds bilaterally, no wheezing, rales,rhonchi or crepitation. No use of accessory muscles of respiration.  CARDIOVASCULAR: Regular rate and rhythm, S1, S2 normal. No murmurs, rubs, or gallops.  ABDOMEN: Soft, nondistended, nontender. Bowel sounds present. No organomegaly or mass.  EXTREMITIES: No pedal edema, cyanosis, or clubbing.  NEUROLOGIC: Cranial nerves II through XII are intact. Muscle strength 5/5 in all extremities. Sensation intact. Gait not checked.  PSYCHIATRIC: The patient is alert and oriented x 3.  Normal affect and good eye contact. SKIN: No obvious rash, lesion, or ulcer.   LABORATORY PANEL:   CBC Recent Labs  Lab 08/25/19 1709  WBC 5.1  HGB 15.2  HCT 45.9  PLT 26*   ------------------------------------------------------------------------------------------------------------------  Chemistries  Recent Labs  Lab 08/25/19 1709  NA 140  K 3.4*  CL 104  CO2 27  GLUCOSE 85  BUN 14  CREATININE 0.95  CALCIUM 8.9  AST 70*  ALT 37  ALKPHOS 72  BILITOT 0.8   ------------------------------------------------------------------------------------------------------------------  Cardiac Enzymes No results for input(s): TROPONINI in the last 168 hours. ------------------------------------------------------------------------------------------------------------------  RADIOLOGY:  No results found.    IMPRESSION AND PLAN:   1.  Acute rhabdomyolysis of questionable etiology. -Patient will be admitted to an observation medical-surgical bed. -He will be aggressively hydrated with IV normal saline. -We will follow his CK levels. -With improvement of his myalgia and with lack of nausea, he can be placed on p.o. Gatorade or hydrating solution like Pedialyte with high sodium content.  2.   Hypokalemia. -We will replace his potassium and check magnesium level.  3.  Worsening thrombocytopenia with history of ITP. -Hematology consultation will be obtained. -We will follow CBC. -Fall precautions.  4.  Elevated blood pressure. -The patient will be placed on as needed IV labetalol. -This could be partly related to myalgia.  5.  DVT prophylaxis. -SCDs. -Medical prophylaxis currently contraindicated due to thrombocytopenia.  All the records are reviewed and case discussed with ED provider. The plan of care was discussed in details with the patient (and family). I answered all questions. The patient agreed to proceed with the above mentioned plan. Further management will depend upon hospital course.   CODE STATUS: Full code  Status is: Observation  The patient remains OBS appropriate and will d/c before 2 midnights.  Dispo: The patient is from: Home              Anticipated d/c is to: Home              Anticipated d/c date is: 1 day              Patient currently is not medically stable to d/c.   TOTAL TIME TAKING CARE OF THIS PATIENT: 50 minutes.    08/27/19 M.D on 08/25/2019 at 7:58 PM  [facility]   From 7 PM-7  AM, contact night-coverage [email or web address]  CC: Primary care physician; [facility]   Note: This dictation was prepared with Nurse, children's dictation along with smaller phrase technology. Any transcriptional typo errors that result from this process are unintentional.

## 2019-08-26 ENCOUNTER — Telehealth: Payer: Self-pay | Admitting: Oncology

## 2019-08-26 DIAGNOSIS — D693 Immune thrombocytopenic purpura: Secondary | ICD-10-CM | POA: Diagnosis not present

## 2019-08-26 DIAGNOSIS — M6282 Rhabdomyolysis: Secondary | ICD-10-CM | POA: Diagnosis not present

## 2019-08-26 LAB — BASIC METABOLIC PANEL
Anion gap: 5 (ref 5–15)
BUN: 11 mg/dL (ref 6–20)
CO2: 26 mmol/L (ref 22–32)
Calcium: 8.1 mg/dL — ABNORMAL LOW (ref 8.9–10.3)
Chloride: 108 mmol/L (ref 98–111)
Creatinine, Ser: 0.79 mg/dL (ref 0.61–1.24)
GFR calc Af Amer: >60 mL/min (ref >60)
GFR calc non Af Amer: >60 mL/min (ref >60)
Glucose, Bld: 135 mg/dL — ABNORMAL HIGH (ref 70–99)
Potassium: 3.4 mmol/L — ABNORMAL LOW (ref 3.5–5.1)
Sodium: 139 mmol/L (ref 135–145)

## 2019-08-26 LAB — CBC
HCT: 37 % — ABNORMAL LOW (ref 39.0–52.0)
Hemoglobin: 12.3 g/dL — ABNORMAL LOW (ref 13.0–17.0)
MCH: 25.5 pg — ABNORMAL LOW (ref 26.0–34.0)
MCHC: 33.2 g/dL (ref 30.0–36.0)
MCV: 76.8 fL — ABNORMAL LOW (ref 80.0–100.0)
Platelets: 27 10*3/uL — CL (ref 150–400)
RBC: 4.82 MIL/uL (ref 4.22–5.81)
RDW: 13.8 % (ref 11.5–15.5)
WBC: 5 10*3/uL (ref 4.0–10.5)
nRBC: 0 % (ref 0.0–0.2)

## 2019-08-26 LAB — TECHNOLOGIST SMEAR REVIEW: Plt Morphology: NORMAL

## 2019-08-26 LAB — FOLATE: Folate: 8.5 ng/mL (ref >5.9)

## 2019-08-26 LAB — CK
Total CK: 3554 U/L — ABNORMAL HIGH (ref 49–397)
Total CK: 3906 U/L — ABNORMAL HIGH (ref 49–397)

## 2019-08-26 LAB — IMMATURE PLATELET FRACTION: Immature Platelet Fraction: 9.7 % — ABNORMAL HIGH (ref 1.2–8.6)

## 2019-08-26 LAB — HIV ANTIBODY (ROUTINE TESTING W REFLEX): HIV Screen 4th Generation wRfx: NONREACTIVE

## 2019-08-26 MED ORDER — SODIUM CHLORIDE 0.9% FLUSH: 10.0000 mL | INTRAVENOUS | Status: DC | PRN

## 2019-08-26 MED ORDER — DEXAMETHASONE 20 MG PO TABS: 40.0000 mg | ORAL_TABLET | Freq: Every day | ORAL | 0 refills | Status: AC

## 2019-08-26 MED ORDER — SODIUM CHLORIDE 0.9% FLUSH: 10.0000 mL | Freq: Two times a day (BID) | INTRAVENOUS | Status: DC

## 2019-08-26 MED ORDER — CHLORHEXIDINE GLUCONATE CLOTH 2 % EX PADS: 6.0000 | MEDICATED_PAD | Freq: Every day | CUTANEOUS | Status: DC

## 2019-08-26 MED ORDER — DEXAMETHASONE 4 MG PO TABS
40.0000 mg | ORAL_TABLET | Freq: Every day | ORAL | Status: DC
  Administered 2019-08-26: 40 mg via ORAL
  Filled 2019-08-26: qty 10

## 2019-08-26 NOTE — Progress Notes (Signed)
Samuel Rojas to be D/C'd Home per MD order.  Discussed prescriptions and follow up appointments with the patient. Prescriptions given to patient, medication list explained in detail. Pt verbalized understanding.  Allergies as of 08/26/2019      Reactions   Augmentin [amoxicillin-pot Clavulanate] Hives   Sulfamethoxazole-trimethoprim    Dehydration, confusion , high BP      Medication List    TAKE these medications   Dexamethasone 20 MG Tabs Take 40 mg by mouth daily for 4 days.   magic mouthwash Soln 1 to 2 teaspoons before meals and at bedtime.  Swish for approximately 5 minutes and spit.       Vitals:   08/25/19 2038 08/26/19 0504  BP: 105/77 119/61  Pulse: 77 74  Resp: 20 20  Temp: 98.5 F (36.9 C) (!) 97.4 F (36.3 C)  SpO2: 100% 100%    Skin clean, dry and intact without evidence of skin break down, no evidence of skin tears noted. IV catheter discontinued intact. Site without signs and symptoms of complications. Dressing and pressure applied. Pt denies pain at this time. No complaints noted.  An After Visit Summary was printed and given to the patient. Patient escorted via WC, and D/C home via private auto.  Madie Reno, RN

## 2019-08-26 NOTE — Discharge Summary (Signed)
[facility] -  at [facility]   PATIENT NAME: Samuel Rojas    MR#:  [id number]  DATE OF BIRTH:  [birth year]  DATE OF ADMISSION:  08/25/2019 ADMITTING PHYSICIAN: Hannah Beat, MD  DATE OF DISCHARGE: 08/26/2019  PRIMARY CARE PHYSICIAN: [facility]    ADMISSION DIAGNOSIS:  Rhabdomyolysis [M62.82] Non-traumatic rhabdomyolysis [M62.82]  DISCHARGE DIAGNOSIS:  Non traumatic Rhabdomyolysis--recurrent Chronic ITP SECONDARY DIAGNOSIS:   Past Medical History:  Diagnosis Date  . Cellulitis    left leg  . Idiopathic thrombocytopenic purpura (ITP) [facility])     HOSPITAL COURSE:  Samuel Rojas  is a 29 y.o. morbidly obese African-American male with a known history of chronic ITP, who presented to the emergency room with acute onset of myalgia that started this morning when he woke up as well as tongue ulceration without loss of taste or smell.  He denied a COVID-19 exposure.  No nausea or vomiting or diarrhea or abdominal pain.  Labs revealed mild hypokalemia 3.4 and AST was 70.  CK came back 4100 and CBC was remarkable for thrombocytopenia of 26 down from 7 7 on 07/17/2019.  COVID-19 PCR came back negative and UA was unremarkable.  1.  Acute non traumatic rhabdomyolysis of questionable etiology. -recieved aggressively hydrated with IV normal saline. -CK 4100--3500-3900 -pt making good urine (yellow) -no myalgias --advised to cont oral hydration  2.  Hypokalemia. -potassium 3.4 and  magnesium  2.0level.  3.  Worsening thrombocytopenia with history of ITP. -Hematology -->curbsided Dr Cathie Hoops -will start pt on po Dexamethasone 40 mg qd x4 days---f/u dr Orlie Dakin on aug 9th appt.  --pt is desperate to go home and therefore above plan was made after d/w oncology -platelet 26K--27K   4.  Elevated blood pressure. -BP 119/61 -This could be partly related to myalgia.  5.  DVT prophylaxis. -SCDs. -Medical prophylaxis currently  contraindicated due to thrombocytopenia.   CODE STATUS: Full code  Status is: Observation  Dispo: The patient is from: Home  Anticipated d/c is to: Home  Anticipated d/c date is: today  Patient currently is medically  Improving--He is desperate to go home.   CONSULTS OBTAINED:  Treatment Team:  Rickard Patience, MD  DRUG ALLERGIES:   Allergies  Allergen Reactions  . Augmentin [Amoxicillin-Pot Clavulanate] Hives  . Sulfamethoxazole-Trimethoprim     Dehydration, confusion , high BP    DISCHARGE MEDICATIONS:   Allergies as of 08/26/2019      Reactions   Augmentin [amoxicillin-pot Clavulanate] Hives   Sulfamethoxazole-trimethoprim    Dehydration, confusion , high BP      Medication List    TAKE these medications   Dexamethasone 20 MG Tabs Take 40 mg by mouth daily for 4 days.   magic mouthwash Soln 1 to 2 teaspoons before meals and at bedtime.  Swish for approximately 5 minutes and spit.       If you experience worsening of your admission symptoms, develop shortness of breath, life threatening emergency, suicidal or homicidal thoughts you must seek medical attention immediately by calling 911 or calling your MD immediately  if symptoms less severe.  You Must read complete instructions/literature along with all the possible adverse reactions/side effects for all the Medicines you take and that have been prescribed to you. Take any new Medicines after you have completely understood and accept all the possible adverse reactions/side effects.   Please note  You were cared for by a hospitalist during your hospital stay. If you have any questions about  your discharge medications or the care you received while you were in the hospital after you are discharged, you can call the unit and asked to speak with the hospitalist on call if the hospitalist that took care of you is not available. Once you are discharged, your primary care physician will  handle any further medical issues. Please note that NO REFILLS for any discharge medications will be authorized once you are discharged, as it is imperative that you return to your primary care physician (or establish a relationship with a primary care physician if you do not have one) for your aftercare needs so that they can reassess your need for medications and monitor your lab values. Today   SUBJECTIVE   I am feeling well. I don't have any myalgias and would like to go home. My urine is yellow  VITAL SIGNS:  Blood pressure 119/61, pulse 74, temperature (!) 97.4 F (36.3 C), temperature source Oral, resp. rate 20, height 5\' 6"  (1.676 m), weight (!) 170.4 kg, SpO2 100 %.  I/O:    Intake/Output Summary (Last 24 hours) at 08/26/2019 1107 Last data filed at 08/26/2019 0900 Gross per 24 hour  Intake 1911.54 ml  Output 0 ml  Net 1911.54 ml    PHYSICAL EXAMINATION:  GENERAL:  29 y.o.-year-old patient lying in the bed with no acute distress. Obese EYES: Pupils equal, round, reactive to light and accommodation. No scleral icterus.  HEENT: Head atraumatic, normocephalic. Oropharynx and nasopharynx clear.  NECK:  Supple, no jugular venous distention. No thyroid enlargement, no tenderness.  LUNGS: Normal breath sounds bilaterally, no wheezing, rales,rhonchi or crepitation. No use of accessory muscles of respiration.  CARDIOVASCULAR: S1, S2 normal. No murmurs, rubs, or gallops.  ABDOMEN: Soft, non-tender, non-distended. Bowel sounds present. No organomegaly or mass.  EXTREMITIES: No pedal edema, cyanosis, or clubbing.  NEUROLOGIC: Cranial nerves II through XII are intact. Muscle strength 5/5 in all extremities. Sensation intact. Gait not checked.  PSYCHIATRIC: The patient is alert and oriented x 3.  SKIN: No obvious rash, lesion, or ulcer.   DATA REVIEW:   CBC  Recent Labs  Lab 08/26/19 0325  WBC 5.0  HGB 12.3*  HCT 37.0*  PLT 27*    Chemistries  Recent Labs  Lab  08/25/19 1709 08/25/19 1709 08/25/19 2125 08/26/19 0325  NA 140   < >  --  139  K 3.4*   < >  --  3.4*  CL 104   < >  --  108  CO2 27   < >  --  26  GLUCOSE 85   < >  --  135*  BUN 14   < >  --  11  CREATININE 0.95   < >  --  0.79  CALCIUM 8.9   < >  --  8.1*  MG  --   --  2.0  --   AST 70*  --   --   --   ALT 37  --   --   --   ALKPHOS 72  --   --   --   BILITOT 0.8  --   --   --    < > = values in this interval not displayed.    Microbiology Results   Recent Results (from the past 240 hour(s))  SARS Coronavirus 2 by RT PCR (hospital order, performed in [facility] hospital lab) Nasopharyngeal Nasopharyngeal Swab     Status: None   Collection Time: 08/25/19  7:20  PM   Specimen: Nasopharyngeal Swab  Result Value Ref Range Status   SARS Coronavirus 2 NEGATIVE NEGATIVE Final    Comment: (NOTE) SARS-CoV-2 target nucleic acids are NOT DETECTED.  The SARS-CoV-2 RNA is generally detectable in upper and lower respiratory specimens during the acute phase of infection. The lowest concentration of SARS-CoV-2 viral copies this assay can detect is 250 copies / mL. A negative result does not preclude SARS-CoV-2 infection and should not be used as the sole basis for treatment or other patient management decisions.  A negative result may occur with improper specimen collection / handling, submission of specimen other than nasopharyngeal swab, presence of viral mutation(s) within the areas targeted by this assay, and inadequate number of viral copies (<250 copies / mL). A negative result must be combined with clinical observations, patient history, and epidemiological information.  Fact Sheet for Patients:   BoilerBrush.com.cy  Fact Sheet for Healthcare Providers: https://pope.com/  This test is not yet approved or  cleared by the [place] FDA and has been authorized for detection and/or diagnosis of SARS-CoV-2 by FDA under an  Emergency Use Authorization (EUA).  This EUA will remain in effect (meaning this test can be used) for the duration of the COVID-19 declaration under Section 564(b)(1) of the Act, 21 U.S.C. section 360bbb-3(b)(1), unless the authorization is terminated or revoked sooner.  Performed at [facility], [address] [place], [place] [address]     RADIOLOGY:  No results found.   CODE STATUS:     Code Status Orders  (From admission, onward)         Start     Ordered   08/25/19 1953  Full code  Continuous        08/25/19 1954        Code Status History    Date Active Date Inactive Code Status Order ID Comments User Context   01/16/2014 0349 01/19/2014 1842 Full Code [id number]  Doree Albee, MD ED   Advance Care Planning Activity       TOTAL TIME TAKING CARE OF THIS PATIENT: 35* minutes.    Enedina Finner M.D  [facility]    CC: Primary care physician; [facility]

## 2019-08-26 NOTE — Telephone Encounter (Signed)
Discussed with Dr.Patel. worsening of thrombocytopenia, history of chronic ITP. Platelet count is <30,000, recommend a course of Dexamethasone 40mg  daily x 4 day. He previously responded to sterid treatments. Consult request will be cancelled as patient desires to go home now.  Pt needs to follow up with Dr.Finnegan outpatient.

## 2019-08-26 NOTE — Progress Notes (Signed)
Patient ID: Samuel Rojas, male   DOB: 1990-02-15, 29 y.o.   MRN: [id number]  Since patient wants to go home I curb sided Dr Cathie Hoops on the phone. Treatment plan was  Discussed. Formal oncology consult will not be done. Pt will f/u Dr Orlie Dakin on aug 9th

## 2019-08-28 NOTE — Telephone Encounter (Signed)
Thank you!  Samuel Rojas- can we see him next week with lab when I get back?  Thanks!

## 2019-09-04 NOTE — Progress Notes (Signed)
[facility]  [phone] [phone]  ID: Samuel Rojas OB: January 23, 1991  MR#: [id number]  KTG#:[id number]  Patient Care Team: [facility] as PCP - General (General Practice) Lloyd Huger, MD as Consulting Physician (Hematology and Oncology)  CHIEF COMPLAINT: ITP, rhabdomyolysis.  INTERVAL HISTORY: Patient returns to clinic today for repeat laboratory work and hospital follow-up.  He was admitted recently with dehydration and rhabdomyolysis for the second time, but now is fully recovered and back to his baseline.  He currently feels well and is asymptomatic. He denies any easy bleeding or bruising. He has no neurologic complaints. He denies any recent fevers or illnesses.  He has a good appetite and intentional weight loss. He has no chest pain, shortness of breath, cough, or hemoptysis.  He denies any nausea, vomiting, constipation, or diarrhea. He has no urinary complaints.  Patient offers no specific complaints today.  REVIEW OF SYSTEMS:   Review of Systems  Constitutional: Positive for weight loss. Negative for fever and malaise/fatigue.  HENT: Negative.  Negative for nosebleeds.   Respiratory: Negative.  Negative for cough, hemoptysis and shortness of breath.   Cardiovascular: Negative.  Negative for chest pain and leg swelling.  Gastrointestinal: Negative.  Negative for abdominal pain, blood in stool and melena.  Genitourinary: Negative.  Negative for hematuria.  Musculoskeletal: Negative.  Negative for back pain and myalgias.  Skin: Negative.  Negative for rash.  Neurological: Negative.  Negative for sensory change, focal weakness and weakness.  Endo/Heme/Allergies: Does not bruise/bleed easily.  Psychiatric/Behavioral: Negative.  The patient is not nervous/anxious.     As per HPI. Otherwise, a complete review of systems is negative.  PAST MEDICAL HISTORY: Past Medical History:  Diagnosis Date  .  Cellulitis    left leg  . Idiopathic thrombocytopenic purpura (ITP) (HCC)     PAST SURGICAL HISTORY: Past Surgical History:  Procedure Laterality Date  . PORTA CATH INSERTION N/A 09/20/2017   Procedure: PORTA CATH INSERTION;  Surgeon: Algernon Huxley, MD;  Location: [facility];  Service: Cardiovascular;  Laterality: N/A;  . TONSILLECTOMY      FAMILY HISTORY: Family History  Problem Relation Age of Onset  . Hypertension Mother   . Diabetes Mother        ADVANCED DIRECTIVES:    HEALTH MAINTENANCE: Social History   Tobacco Use  . Smoking status: Never Smoker  . Smokeless tobacco: Never Used  Vaping Use  . Vaping Use: Never used  Substance Use Topics  . Alcohol use: No  . Drug use: No     Colonoscopy:  PAP:  Bone density:  Lipid panel:  Allergies  Allergen Reactions  . Augmentin [Amoxicillin-Pot Clavulanate] Hives  . Sulfamethoxazole-Trimethoprim     Dehydration, confusion , high BP    Current Outpatient Medications  Medication Sig Dispense Refill  . magic mouthwash SOLN 1 to 2 teaspoons before meals and at bedtime.  Swish for approximately 5 minutes and spit. 100 mL 0   No current facility-administered medications for this visit.   Facility-Administered Medications Ordered in Other Visits  Medication Dose Route Frequency Provider Last Rate Last Admin  . famotidine (PEPCID) IVPB 20 mg premix  20 mg Intravenous Q12H Lloyd Huger, MD   Stopped at 10/01/17 1001  . sodium chloride flush (NS) 0.9 % injection 10 mL  10 mL Intracatheter PRN Lloyd Huger, MD   10 mL at 10/01/17 0855    OBJECTIVE: Vitals:   09/08/19 1035  BP: (!) 149/98  Pulse: 61  Resp: 20  Temp: 98 F (36.7 C)  SpO2: 100%     Body mass index is 59.59 kg/m.    ECOG FS:0 - Asymptomatic  General: Well-developed, well-nourished, no acute distress. Eyes: Pink conjunctiva, anicteric sclera. HEENT: Normocephalic, moist mucous membranes. Lungs: No audible wheezing or  coughing. Heart: Regular rate and rhythm. Abdomen: Soft, nontender, no obvious distention. Musculoskeletal: No edema, cyanosis, or clubbing. Neuro: Alert, answering all questions appropriately. Cranial nerves grossly intact. Skin: No rashes or petechiae noted. Psych: Normal affect.   LAB RESULTS:  Lab Results  Component Value Date   NA 139 08/26/2019   K 3.4 (L) 08/26/2019   CL 108 08/26/2019   CO2 26 08/26/2019   GLUCOSE 135 (H) 08/26/2019   BUN 11 08/26/2019   CREATININE 0.79 08/26/2019   CALCIUM 8.1 (L) 08/26/2019   PROT 7.9 08/25/2019   ALBUMIN 4.4 08/25/2019   AST 70 (H) 08/25/2019   ALT 37 08/25/2019   ALKPHOS 72 08/25/2019   BILITOT 0.8 08/25/2019   GFRNONAA >60 08/26/2019   GFRAA >60 08/26/2019    Lab Results  Component Value Date   WBC 6.3 09/08/2019   NEUTROABS 4.1 09/08/2019   HGB 13.0 09/08/2019   HCT 39.6 09/08/2019   MCV 78.3 (L) 09/08/2019   PLT 95 (L) 09/08/2019   Lab Results  Component Value Date   IRON 50 09/09/2015   TIBC 320 09/09/2015   IRONPCTSAT 16 (L) 09/09/2015   Lab Results  Component Value Date   FERRITIN 75 09/09/2015     STUDIES: No results found.  ASSESSMENT: ITP, rhabdomyolysis.  PLAN:     1.  ITP: Previously, patient received 5 days of 100 mg prednisone with an excellent response to his platelets increasing to greater than 200.  Unfortunately, response was not durable. 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  Splenic ultrasound did not reveal any evidence of splenomegaly.  Bone marrow biopsy was not completed secondary to technical difficulties with patient's body habitus.  Patient completed his first round of weekly Rituxan x4 on October 08, 2017.  He then completed his second round of weekly Rituxan on September 09, 2018.  Patient's platelet count has increased to 95, but this is likely residual to his steroids he received while in the hospital.  Return to clinic on 09/22/2019 to  reinitiate weekly Rituxan x4.  2.  Poor venous access: Patient now has a port.  Continue port flushes every 6-8 weeks. 3.  Nontraumatic rhabdomyolysis: Resolved.  CK levels are within normal limits.  This is patient's second admission for rhabdomyolysis.  Monitor.  I spent a total of 30 minutes reviewing chart data, face-to-face evaluation with the patient, counseling and coordination of care as detailed above.   Patient expressed understanding and was in agreement with this plan. He also understands that He can call clinic at any time with any questions, concerns, or complaints.    Lloyd Huger, MD   09/09/2019 11:55 AM

## 2019-09-08 ENCOUNTER — Encounter: Payer: Self-pay | Admitting: Oncology

## 2019-09-08 ENCOUNTER — Inpatient Hospital Stay: Payer: BC Managed Care – PPO

## 2019-09-08 ENCOUNTER — Inpatient Hospital Stay: Payer: BC Managed Care – PPO | Attending: Oncology

## 2019-09-08 ENCOUNTER — Inpatient Hospital Stay (HOSPITAL_BASED_OUTPATIENT_CLINIC_OR_DEPARTMENT_OTHER): Payer: BC Managed Care – PPO | Admitting: Oncology

## 2019-09-08 ENCOUNTER — Other Ambulatory Visit: Payer: Self-pay

## 2019-09-08 VITALS — BP 149/98 | HR 61 | Temp 98.0°F | Resp 20 | Wt 369.2 lb

## 2019-09-08 DIAGNOSIS — Z8249 Family history of ischemic heart disease and other diseases of the circulatory system: Secondary | ICD-10-CM | POA: Diagnosis not present

## 2019-09-08 DIAGNOSIS — Z833 Family history of diabetes mellitus: Secondary | ICD-10-CM | POA: Insufficient documentation

## 2019-09-08 DIAGNOSIS — Z95828 Presence of other vascular implants and grafts: Secondary | ICD-10-CM

## 2019-09-08 DIAGNOSIS — D693 Immune thrombocytopenic purpura: Secondary | ICD-10-CM | POA: Diagnosis not present

## 2019-09-08 DIAGNOSIS — Z881 Allergy status to other antibiotic agents status: Secondary | ICD-10-CM | POA: Insufficient documentation

## 2019-09-08 DIAGNOSIS — Z79899 Other long term (current) drug therapy: Secondary | ICD-10-CM | POA: Insufficient documentation

## 2019-09-08 DIAGNOSIS — R634 Abnormal weight loss: Secondary | ICD-10-CM | POA: Diagnosis not present

## 2019-09-08 DIAGNOSIS — Z880 Allergy status to penicillin: Secondary | ICD-10-CM | POA: Diagnosis not present

## 2019-09-08 DIAGNOSIS — D696 Thrombocytopenia, unspecified: Secondary | ICD-10-CM

## 2019-09-08 DIAGNOSIS — M6282 Rhabdomyolysis: Secondary | ICD-10-CM | POA: Insufficient documentation

## 2019-09-08 LAB — CBC WITH DIFFERENTIAL/PLATELET
Abs Immature Granulocytes: 0.01 10*3/uL (ref 0.00–0.07)
Basophils Absolute: 0 10*3/uL (ref 0.0–0.1)
Basophils Relative: 0 %
Eosinophils Absolute: 0.1 10*3/uL (ref 0.0–0.5)
Eosinophils Relative: 1 %
HCT: 39.6 % (ref 39.0–52.0)
Hemoglobin: 13 g/dL (ref 13.0–17.0)
Immature Granulocytes: 0 %
Lymphocytes Relative: 27 %
Lymphs Abs: 1.7 10*3/uL (ref 0.7–4.0)
MCH: 25.7 pg — ABNORMAL LOW (ref 26.0–34.0)
MCHC: 32.8 g/dL (ref 30.0–36.0)
MCV: 78.3 fL — ABNORMAL LOW (ref 80.0–100.0)
Monocytes Absolute: 0.4 10*3/uL (ref 0.1–1.0)
Monocytes Relative: 7 %
Neutro Abs: 4.1 10*3/uL (ref 1.7–7.7)
Neutrophils Relative %: 65 %
Platelets: 95 10*3/uL — ABNORMAL LOW (ref 150–400)
RBC: 5.06 MIL/uL (ref 4.22–5.81)
RDW: 14.5 % (ref 11.5–15.5)
WBC: 6.3 10*3/uL (ref 4.0–10.5)
nRBC: 0 % (ref 0.0–0.2)

## 2019-09-08 LAB — CK: Total CK: 135 U/L (ref 49–397)

## 2019-09-08 MED ORDER — SODIUM CHLORIDE 0.9% FLUSH
10.0000 mL | INTRAVENOUS | Status: DC | PRN
  Administered 2019-09-08: 10 mL via INTRAVENOUS
  Filled 2019-09-08: qty 10

## 2019-09-08 MED ORDER — HEPARIN SOD (PORK) LOCK FLUSH 100 UNIT/ML IV SOLN
500.0000 [IU] | Freq: Once | INTRAVENOUS | Status: AC
  Administered 2019-09-08: 500 [IU] via INTRAVENOUS
  Filled 2019-09-08: qty 5

## 2019-09-15 NOTE — Progress Notes (Signed)
Pharmacist Chemotherapy Monitoring - Initial Assessment    Anticipated start date: 09/22/19  Regimen:  . Are orders appropriate based on the patient's diagnosis, regimen, and cycle? Yes . Does the plan date match the patient's scheduled date? Yes . Is the sequencing of drugs appropriate? Yes . Are the premedications appropriate for the patient's regimen? Yes . Prior Authorization for treatment is: Pending o If applicable, is the correct biosimilar selected based on the patient's insurance? no  Organ Function and Labs: Marland Kitchen Are dose adjustments needed based on the patient's renal function, hepatic function, or hematologic function? No . Are appropriate labs ordered prior to the start of patient's treatment? Yes . Other organ system assessment, if indicated: N/A . The following baseline labs, if indicated, have been ordered: rituximab: baseline Hepatitis B labs  Dose Assessment: . Are the drug doses appropriate? Yes . Are the following correct: o Drug concentrations Yes o IV fluid compatible with drug Yes o Administration routes Yes o Timing of therapy Yes . If applicable, does the patient have documented access for treatment and/or plans for port-a-cath placement? yes . If applicable, have lifetime cumulative doses been properly documented and assessed? no Lifetime Dose Tracking  No doses have been documented on this patient for the following tracked chemicals: Doxorubicin, Epirubicin, Idarubicin, Daunorubicin, Mitoxantrone, Bleomycin, Oxaliplatin, Carboplatin, Liposomal Doxorubicin  o   Toxicity Monitoring/Prevention: . The patient has the following take home antiemetics prescribed: N/A . The patient has the following take home medications prescribed: hypersensitivity pre-meds  . Medication allergies and previous infusion related reactions, if applicable, have been reviewed and addressed.  . The patient's current medication list has been assessed for drug-drug interactions with their  chemotherapy regimen. no significant drug-drug interactions were identified on review.  Order Review: . Are the treatment plan orders signed? Yes . Is the patient scheduled to see a provider prior to their treatment? Yes  I verify that I have reviewed each item in the above checklist and answered each question accordingly.  Rueben Bash 09/15/2019 8:20 AM

## 2019-09-15 NOTE — Progress Notes (Signed)
[place]  Telephone:(336) [phone] Fax:(336) [phone]  ID: Samuel Rojas OB: 12-Nov-1990  MR#: [id number]  LJQ#:[id number]  Patient Care Team: [facility] as PCP - General (General Practice) Lloyd Huger, MD as Consulting Physician (Hematology and Oncology)  CHIEF COMPLAINT: ITP, rhabdomyolysis.  INTERVAL HISTORY: Patient returns to clinic today for repeat laboratory work and reinitiation of Rituxan.  He currently feels well and is asymptomatic.  He continues to have intentional weight loss.  He denies any easy bleeding or bruising. He has no neurologic complaints. He denies any recent fevers or illnesses. He has no chest pain, shortness of breath, cough, or hemoptysis.  He denies any nausea, vomiting, constipation, or diarrhea. He has no urinary complaints.  Patient offers no specific complaints today.  REVIEW OF SYSTEMS:   Review of Systems  Constitutional: Positive for weight loss. Negative for fever and malaise/fatigue.  HENT: Negative.  Negative for nosebleeds.   Respiratory: Negative.  Negative for cough, hemoptysis and shortness of breath.   Cardiovascular: Negative.  Negative for chest pain and leg swelling.  Gastrointestinal: Negative.  Negative for abdominal pain, blood in stool and melena.  Genitourinary: Negative.  Negative for hematuria.  Musculoskeletal: Negative.  Negative for back pain and myalgias.  Skin: Negative.  Negative for rash.  Neurological: Negative.  Negative for sensory change, focal weakness and weakness.  Endo/Heme/Allergies: Does not bruise/bleed easily.  Psychiatric/Behavioral: Negative.  The patient is not nervous/anxious.     As per HPI. Otherwise, a complete review of systems is negative.  PAST MEDICAL HISTORY: Past Medical History:  Diagnosis Date  . Cellulitis    left leg  . Idiopathic thrombocytopenic purpura (ITP) (HCC)     PAST SURGICAL HISTORY: Past Surgical History:    Procedure Laterality Date  . PORTA CATH INSERTION N/A 09/20/2017   Procedure: PORTA CATH INSERTION;  Surgeon: Algernon Huxley, MD;  Location: [facility];  Service: Cardiovascular;  Laterality: N/A;  . TONSILLECTOMY      FAMILY HISTORY: Family History  Problem Relation Age of Onset  . Hypertension Mother   . Diabetes Mother        ADVANCED DIRECTIVES:    HEALTH MAINTENANCE: Social History   Tobacco Use  . Smoking status: Never Smoker  . Smokeless tobacco: Never Used  Vaping Use  . Vaping Use: Never used  Substance Use Topics  . Alcohol use: No  . Drug use: No     Colonoscopy:  PAP:  Bone density:  Lipid panel:  Allergies  Allergen Reactions  . Augmentin [Amoxicillin-Pot Clavulanate] Hives  . Sulfamethoxazole-Trimethoprim     Dehydration, confusion , high BP    Current Outpatient Medications  Medication Sig Dispense Refill  . magic mouthwash SOLN 1 to 2 teaspoons before meals and at bedtime.  Swish for approximately 5 minutes and spit. (Patient not taking: Reported on 09/22/2019) 100 mL 0   No current facility-administered medications for this visit.   Facility-Administered Medications Ordered in Other Visits  Medication Dose Route Frequency Provider Last Rate Last Admin  . famotidine (PEPCID) IVPB 20 mg premix  20 mg Intravenous Q12H Lloyd Huger, MD   Stopped at 10/01/17 1001  . sodium chloride flush (NS) 0.9 % injection 10 mL  10 mL Intracatheter PRN Lloyd Huger, MD   10 mL at 10/01/17 0855    OBJECTIVE: Vitals:   09/22/19 0844  BP: 139/74  Pulse: 61  Resp: 18  Temp: 97.8 F (36.6 C)  SpO2: 100%     Body mass index is 58.82 kg/m.    ECOG FS:0 - Asymptomatic  General: Well-developed, well-nourished, no acute distress. Eyes: Pink conjunctiva, anicteric sclera. HEENT: Normocephalic, moist mucous membranes. Lungs: No audible wheezing or coughing. Heart: Regular rate and rhythm. Abdomen: Soft, nontender, no obvious  distention. Musculoskeletal: No edema, cyanosis, or clubbing. Neuro: Alert, answering all questions appropriately. Cranial nerves grossly intact. Skin: No rashes or petechiae noted. Psych: Normal affect.   LAB RESULTS:  Lab Results  Component Value Date   NA 139 08/26/2019   K 3.4 (L) 08/26/2019   CL 108 08/26/2019   CO2 26 08/26/2019   GLUCOSE 135 (H) 08/26/2019   BUN 11 08/26/2019   CREATININE 0.79 08/26/2019   CALCIUM 8.1 (L) 08/26/2019   PROT 7.9 08/25/2019   ALBUMIN 4.4 08/25/2019   AST 70 (H) 08/25/2019   ALT 37 08/25/2019   ALKPHOS 72 08/25/2019   BILITOT 0.8 08/25/2019   GFRNONAA >60 08/26/2019   GFRAA >60 08/26/2019    Lab Results  Component Value Date   WBC 4.4 09/22/2019   NEUTROABS 3.0 09/22/2019   HGB 12.9 (L) 09/22/2019   HCT 39.6 09/22/2019   MCV 78.6 (L) 09/22/2019   PLT 161 09/22/2019   Lab Results  Component Value Date   IRON 50 09/09/2015   TIBC 320 09/09/2015   IRONPCTSAT 16 (L) 09/09/2015   Lab Results  Component Value Date   FERRITIN 75 09/09/2015     STUDIES: No results found.  ASSESSMENT: ITP, rhabdomyolysis.  PLAN:     1.  ITP: Previously, patient received 5 days of 100 mg prednisone with an excellent response to his platelets increasing to greater than 200.  Unfortunately, response was not durable. 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  Splenic ultrasound did not reveal any evidence of splenomegaly.  Bone marrow biopsy was not completed secondary to technical difficulties with patient's body habitus.  Patient completed his first round of weekly Rituxan x4 on October 08, 2017.  He then completed his second round of weekly Rituxan on September 09, 2018.  Patient's platelet count continues to increase and is now 161.  We will cancel Rituxan infusion today.  Patient will have laboratory work and video assisted telemedicine visit in 1 month for further evaluation.   2.  Poor venous access:  Patient now has a port.  Continue port flushes every 6-8 weeks. 3.  Nontraumatic rhabdomyolysis: Resolved.  CK levels are within normal limits.  This is patient's second admission for rhabdomyolysis.  Monitor.   I spent a total of 20 minutes reviewing chart data, face-to-face evaluation with the patient, counseling and coordination of care as detailed above.   Patient expressed understanding and was in agreement with this plan. He also understands that He can call clinic at any time with any questions, concerns, or complaints.    Lloyd Huger, MD   09/22/2019 9:18 AM

## 2019-09-18 ENCOUNTER — Other Ambulatory Visit: Payer: BC Managed Care – PPO

## 2019-09-18 ENCOUNTER — Ambulatory Visit: Payer: BC Managed Care – PPO

## 2019-09-18 ENCOUNTER — Ambulatory Visit: Payer: BC Managed Care – PPO | Admitting: Oncology

## 2019-09-19 ENCOUNTER — Telehealth: Payer: BC Managed Care – PPO | Admitting: Oncology

## 2019-09-22 ENCOUNTER — Inpatient Hospital Stay: Payer: BC Managed Care – PPO

## 2019-09-22 ENCOUNTER — Inpatient Hospital Stay: Payer: BC Managed Care – PPO | Attending: Oncology | Admitting: Oncology

## 2019-09-22 ENCOUNTER — Encounter: Payer: Self-pay | Admitting: Oncology

## 2019-09-22 ENCOUNTER — Other Ambulatory Visit: Payer: Self-pay

## 2019-09-22 VITALS — BP 139/74 | HR 61 | Temp 97.8°F | Resp 18 | Wt 364.4 lb

## 2019-09-22 DIAGNOSIS — Z452 Encounter for adjustment and management of vascular access device: Secondary | ICD-10-CM | POA: Insufficient documentation

## 2019-09-22 DIAGNOSIS — M6282 Rhabdomyolysis: Secondary | ICD-10-CM | POA: Insufficient documentation

## 2019-09-22 DIAGNOSIS — D693 Immune thrombocytopenic purpura: Secondary | ICD-10-CM | POA: Diagnosis not present

## 2019-09-22 DIAGNOSIS — Z95828 Presence of other vascular implants and grafts: Secondary | ICD-10-CM

## 2019-09-22 LAB — CBC WITH DIFFERENTIAL/PLATELET
Abs Immature Granulocytes: 0.01 10*3/uL (ref 0.00–0.07)
Basophils Absolute: 0 10*3/uL (ref 0.0–0.1)
Basophils Relative: 0 %
Eosinophils Absolute: 0.1 10*3/uL (ref 0.0–0.5)
Eosinophils Relative: 2 %
HCT: 39.6 % (ref 39.0–52.0)
Hemoglobin: 12.9 g/dL — ABNORMAL LOW (ref 13.0–17.0)
Immature Granulocytes: 0 %
Lymphocytes Relative: 28 %
Lymphs Abs: 1.2 10*3/uL (ref 0.7–4.0)
MCH: 25.6 pg — ABNORMAL LOW (ref 26.0–34.0)
MCHC: 32.6 g/dL (ref 30.0–36.0)
MCV: 78.6 fL — ABNORMAL LOW (ref 80.0–100.0)
Monocytes Absolute: 0.1 10*3/uL (ref 0.1–1.0)
Monocytes Relative: 3 %
Neutro Abs: 3 10*3/uL (ref 1.7–7.7)
Neutrophils Relative %: 67 %
Platelets: 161 10*3/uL (ref 150–400)
RBC: 5.04 MIL/uL (ref 4.22–5.81)
RDW: 13.9 % (ref 11.5–15.5)
WBC: 4.4 10*3/uL (ref 4.0–10.5)
nRBC: 0 % (ref 0.0–0.2)

## 2019-09-22 LAB — CK: Total CK: 76 U/L (ref 49–397)

## 2019-09-22 MED ORDER — HEPARIN SOD (PORK) LOCK FLUSH 100 UNIT/ML IV SOLN
500.0000 [IU] | Freq: Once | INTRAVENOUS | Status: AC
  Administered 2019-09-22: 500 [IU] via INTRAVENOUS
  Filled 2019-09-22: qty 5

## 2019-09-22 MED ORDER — SODIUM CHLORIDE 0.9% FLUSH
10.0000 mL | Freq: Once | INTRAVENOUS | Status: AC
  Administered 2019-09-22: 10 mL via INTRAVENOUS
  Filled 2019-09-22: qty 10

## 2019-09-22 NOTE — Progress Notes (Signed)
Patient here for oncology follow-up appointment, expresses no complaints or concerns at this time.    

## 2019-10-21 NOTE — Progress Notes (Signed)
[name]  Telephone:(336) [phone] Fax:(336) [phone]  ID: Samuel Rojas OB: August 07, 1990  MR#: [id number]  QIO#:[id number]  Patient Care Team: [facility], [facility] as PCP - General (General Practice) Lloyd Huger, MD as Consulting Physician (Hematology and Oncology)  I connected with Samuel Rojas on 10/24/19 at  3:00 PM EDT by video enabled telemedicine visit and verified that I am speaking with the correct person using two identifiers.   I discussed the limitations, risks, security and privacy concerns of performing an evaluation and management service by telemedicine and the availability of in-person appointments. I also discussed with the patient that there may be a patient responsible charge related to this service. The patient expressed understanding and agreed to proceed.   Other persons participating in the visit and their role in the encounter: Patient, MD.  Patient's location: Home. Provider's location: Clinic.  CHIEF COMPLAINT: ITP, rhabdomyolysis.  INTERVAL HISTORY: Patient agreed to video assisted telemedicine visit for further evaluation and discussion of his laboratory results.  He currently feels well and is asymptomatic.  He continues to have intentional weight loss.  He denies any easy bleeding or bruising.  He has no neurologic complaints. He denies any recent fevers or illnesses. He has no chest pain, shortness of breath, cough, or hemoptysis.  He denies any nausea, vomiting, constipation, or diarrhea. He has no urinary complaints.  Patient feels at his baseline offers no specific complaints today.  REVIEW OF SYSTEMS:   Review of Systems  Constitutional: Positive for weight loss. Negative for fever and malaise/fatigue.  HENT: Negative.  Negative for nosebleeds.   Respiratory: Negative.  Negative for cough, hemoptysis and shortness of breath.   Cardiovascular: Negative.  Negative for chest pain and leg  swelling.  Gastrointestinal: Negative.  Negative for abdominal pain, blood in stool and melena.  Genitourinary: Negative.  Negative for hematuria.  Musculoskeletal: Negative.  Negative for back pain and myalgias.  Skin: Negative.  Negative for rash.  Neurological: Negative.  Negative for sensory change, focal weakness and weakness.  Endo/Heme/Allergies: Does not bruise/bleed easily.  Psychiatric/Behavioral: Negative.  The patient is not nervous/anxious.     As per HPI. Otherwise, a complete review of systems is negative.  PAST MEDICAL HISTORY: Past Medical History:  Diagnosis Date  . Cellulitis    left leg  . Idiopathic thrombocytopenic purpura (ITP) (HCC)     PAST SURGICAL HISTORY: Past Surgical History:  Procedure Laterality Date  . PORTA CATH INSERTION N/A 09/20/2017   Procedure: PORTA CATH INSERTION;  Surgeon: Algernon Huxley, MD;  Location: [facility];  Service: Cardiovascular;  Laterality: N/A;  . TONSILLECTOMY      FAMILY HISTORY: Family History  Problem Relation Age of Onset  . Hypertension Mother   . Diabetes Mother        ADVANCED DIRECTIVES:    HEALTH MAINTENANCE: Social History   Tobacco Use  . Smoking status: Never Smoker  . Smokeless tobacco: Never Used  Vaping Use  . Vaping Use: Never used  Substance Use Topics  . Alcohol use: No  . Drug use: No     Colonoscopy:  PAP:  Bone density:  Lipid panel:  Allergies  Allergen Reactions  . Augmentin [Amoxicillin-Pot Clavulanate] Hives  . Sulfamethoxazole-Trimethoprim     Dehydration, confusion , high BP    No current outpatient medications on file.   No current facility-administered medications for this visit.   Facility-Administered Medications Ordered in Other Visits  Medication Dose Route  Frequency Provider Last Rate Last Admin  . famotidine (PEPCID) IVPB 20 mg premix  20 mg Intravenous Q12H Lloyd Huger, MD   Stopped at 10/01/17 1001  . sodium chloride flush (NS) 0.9 %  injection 10 mL  10 mL Intracatheter PRN Lloyd Huger, MD   10 mL at 10/01/17 0855    OBJECTIVE: There were no vitals filed for this visit.   There is no height or weight on file to calculate BMI.    ECOG FS:0 - Asymptomatic  General: Well-developed, well-nourished, no acute distress. HEENT: Normocephalic. Neuro: Alert, answering all questions appropriately. Cranial nerves grossly intact. Psych: Normal affect.  LAB RESULTS:  Lab Results  Component Value Date   NA 139 08/26/2019   K 3.4 (L) 08/26/2019   CL 108 08/26/2019   CO2 26 08/26/2019   GLUCOSE 135 (H) 08/26/2019   BUN 11 08/26/2019   CREATININE 0.79 08/26/2019   CALCIUM 8.1 (L) 08/26/2019   PROT 7.9 08/25/2019   ALBUMIN 4.4 08/25/2019   AST 70 (H) 08/25/2019   ALT 37 08/25/2019   ALKPHOS 72 08/25/2019   BILITOT 0.8 08/25/2019   GFRNONAA >60 08/26/2019   GFRAA >60 08/26/2019    Lab Results  Component Value Date   WBC 5.3 10/23/2019   NEUTROABS 3.6 10/23/2019   HGB 13.4 10/23/2019   HCT 40.7 10/23/2019   MCV 78.0 (L) 10/23/2019   PLT 129 (L) 10/23/2019   Lab Results  Component Value Date   IRON 50 09/09/2015   TIBC 320 09/09/2015   IRONPCTSAT 16 (L) 09/09/2015   Lab Results  Component Value Date   FERRITIN 75 09/09/2015     STUDIES: No results found.  ASSESSMENT: ITP, rhabdomyolysis.  PLAN:     1.  ITP: Previously, patient received 5 days of 100 mg prednisone with an excellent response to his platelets increasing to greater than 200.  Unfortunately, response was not durable. 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  Splenic ultrasound did not reveal any evidence of splenomegaly.  Bone marrow biopsy was not completed secondary to technical difficulties with patient's body habitus.  Patient completed his first round of weekly Rituxan x4 on October 08, 2017.  He then completed his second round of weekly Rituxan on September 09, 2018.  Platelet count has  trended down slightly and is now 129.  No intervention is needed at this time.  Return to clinic in 1 month for laboratory work only and then in 2 months for laboratory work and video assisted telemedicine visit.     2.  Poor venous access: Patient now has a port.  Continue port flushes every 6-8 weeks. 3.  Nontraumatic rhabdomyolysis: Resolved.  CK levels are within normal limits.  This is patient's second admission for rhabdomyolysis.  Monitor.  I provided 20 minutes of face-to-face video visit time during this encounter which included chart review, counseling, and coordination of care as documented above.   Patient expressed understanding and was in agreement with this plan. He also understands that He can call clinic at any time with any questions, concerns, or complaints.    Lloyd Huger, MD   10/24/2019 6:33 PM

## 2019-10-23 ENCOUNTER — Other Ambulatory Visit: Payer: Self-pay

## 2019-10-23 ENCOUNTER — Inpatient Hospital Stay: Payer: BC Managed Care – PPO | Attending: Oncology

## 2019-10-23 DIAGNOSIS — Z452 Encounter for adjustment and management of vascular access device: Secondary | ICD-10-CM | POA: Insufficient documentation

## 2019-10-23 DIAGNOSIS — D693 Immune thrombocytopenic purpura: Secondary | ICD-10-CM | POA: Diagnosis not present

## 2019-10-23 DIAGNOSIS — Z95828 Presence of other vascular implants and grafts: Secondary | ICD-10-CM

## 2019-10-23 DIAGNOSIS — M6282 Rhabdomyolysis: Secondary | ICD-10-CM | POA: Diagnosis not present

## 2019-10-23 LAB — CBC WITH DIFFERENTIAL/PLATELET
Abs Immature Granulocytes: 0.01 10*3/uL (ref 0.00–0.07)
Basophils Absolute: 0 10*3/uL (ref 0.0–0.1)
Basophils Relative: 0 %
Eosinophils Absolute: 0.1 10*3/uL (ref 0.0–0.5)
Eosinophils Relative: 2 %
HCT: 40.7 % (ref 39.0–52.0)
Hemoglobin: 13.4 g/dL (ref 13.0–17.0)
Immature Granulocytes: 0 %
Lymphocytes Relative: 26 %
Lymphs Abs: 1.4 10*3/uL (ref 0.7–4.0)
MCH: 25.7 pg — ABNORMAL LOW (ref 26.0–34.0)
MCHC: 32.9 g/dL (ref 30.0–36.0)
MCV: 78 fL — ABNORMAL LOW (ref 80.0–100.0)
Monocytes Absolute: 0.2 10*3/uL (ref 0.1–1.0)
Monocytes Relative: 5 %
Neutro Abs: 3.6 10*3/uL (ref 1.7–7.7)
Neutrophils Relative %: 67 %
Platelets: 129 10*3/uL — ABNORMAL LOW (ref 150–400)
RBC: 5.22 MIL/uL (ref 4.22–5.81)
RDW: 13.8 % (ref 11.5–15.5)
WBC: 5.3 10*3/uL (ref 4.0–10.5)
nRBC: 0 % (ref 0.0–0.2)

## 2019-10-23 MED ORDER — HEPARIN SOD (PORK) LOCK FLUSH 100 UNIT/ML IV SOLN
500.0000 [IU] | Freq: Once | INTRAVENOUS | Status: AC
  Administered 2019-10-23: 500 [IU] via INTRAVENOUS
  Filled 2019-10-23: qty 5

## 2019-10-23 MED ORDER — SODIUM CHLORIDE 0.9% FLUSH
10.0000 mL | INTRAVENOUS | Status: DC | PRN
  Administered 2019-10-23: 10 mL via INTRAVENOUS
  Filled 2019-10-23: qty 10

## 2019-10-24 ENCOUNTER — Encounter: Payer: Self-pay | Admitting: Oncology

## 2019-10-24 ENCOUNTER — Inpatient Hospital Stay (HOSPITAL_BASED_OUTPATIENT_CLINIC_OR_DEPARTMENT_OTHER): Payer: BC Managed Care – PPO | Admitting: Oncology

## 2019-10-24 DIAGNOSIS — D693 Immune thrombocytopenic purpura: Secondary | ICD-10-CM | POA: Diagnosis not present

## 2019-10-24 NOTE — Progress Notes (Signed)
Patient denies any pain or concerns at this time.  

## 2019-11-23 ENCOUNTER — Inpatient Hospital Stay: Payer: BC Managed Care – PPO | Attending: Oncology

## 2019-12-23 NOTE — Progress Notes (Signed)
[name]  Telephone:(336) [phone] Fax:(336) [phone]  ID: GRASON BRAILSFORD OB: 21-Jun-1990  MR#: [id number]  PFX#:[id number]  Patient Care Team: [facility], [place] as PCP - General (General Practice) Lloyd Huger, MD as Consulting Physician (Hematology and Oncology)  I connected with Samuel Rojas on 12/30/19 at  2:45 PM EST by video enabled telemedicine visit and verified that I am speaking with the correct person using two identifiers.   I discussed the limitations, risks, security and privacy concerns of performing an evaluation and management service by telemedicine and the availability of in-person appointments. I also discussed with the patient that there may be a patient responsible charge related to this service. The patient expressed understanding and agreed to proceed.   Other persons participating in the visit and their role in the encounter: Patient, MD.  Patient's location: Home. Provider's location: Clinic.  CHIEF COMPLAINT: ITP, rhabdomyolysis.  INTERVAL HISTORY: Patient agreed to video assisted telemedicine visit for further evaluation and discussion of his laboratory results.  He continues to feel well and remains asymptomatic.  He denies any easy bleeding or bruising.  He has no neurologic complaints. He denies any recent fevers or illnesses. He has no chest pain, shortness of breath, cough, or hemoptysis.  He denies any nausea, vomiting, constipation, or diarrhea. He has no urinary complaints.  Patient feels at his baseline and offers no specific complaints today.  REVIEW OF SYSTEMS:   Review of Systems  Constitutional: Negative.  Negative for fever, malaise/fatigue and weight loss.  HENT: Negative.  Negative for nosebleeds.   Respiratory: Negative.  Negative for cough, hemoptysis and shortness of breath.   Cardiovascular: Negative.  Negative for chest pain and leg swelling.  Gastrointestinal: Negative.   Negative for abdominal pain, blood in stool and melena.  Genitourinary: Negative.  Negative for hematuria.  Musculoskeletal: Negative.  Negative for back pain and myalgias.  Skin: Negative.  Negative for rash.  Neurological: Negative.  Negative for sensory change, focal weakness and weakness.  Endo/Heme/Allergies: Does not bruise/bleed easily.  Psychiatric/Behavioral: Negative.  The patient is not nervous/anxious.     As per HPI. Otherwise, a complete review of systems is negative.  PAST MEDICAL HISTORY: Past Medical History:  Diagnosis Date  . Cellulitis    left leg  . Idiopathic thrombocytopenic purpura (ITP) (HCC)     PAST SURGICAL HISTORY: Past Surgical History:  Procedure Laterality Date  . PORTA CATH INSERTION N/A 09/20/2017   Procedure: PORTA CATH INSERTION;  Surgeon: Algernon Huxley, MD;  Location: [place] [facility];  Service: Cardiovascular;  Laterality: N/A;  . TONSILLECTOMY      FAMILY HISTORY: Family History  Problem Relation Age of Onset  . Hypertension Mother   . Diabetes Mother        ADVANCED DIRECTIVES:    HEALTH MAINTENANCE: Social History   Tobacco Use  . Smoking status: Never Smoker  . Smokeless tobacco: Never Used  Vaping Use  . Vaping Use: Never used  Substance Use Topics  . Alcohol use: No  . Drug use: No     Colonoscopy:  PAP:  Bone density:  Lipid panel:  Allergies  Allergen Reactions  . Augmentin [Amoxicillin-Pot Clavulanate] Hives  . Sulfamethoxazole-Trimethoprim     Dehydration, confusion , high BP    No current outpatient medications on file.   No current facility-administered medications for this visit.   Facility-Administered Medications Ordered in Other Visits  Medication Dose Route Frequency Provider Last Rate Last Admin  .  famotidine (PEPCID) IVPB 20 mg premix  20 mg Intravenous Q12H Lloyd Huger, MD   Stopped at 10/01/17 1001  . sodium chloride flush (NS) 0.9 % injection 10 mL  10 mL Intracatheter PRN  Lloyd Huger, MD   10 mL at 10/01/17 0855    OBJECTIVE: There were no vitals filed for this visit.   There is no height or weight on file to calculate BMI.    ECOG FS:0 - Asymptomatic  General: Well-developed, well-nourished, no acute distress. HEENT: Normocephalic. Neuro: Alert, answering all questions appropriately. Cranial nerves grossly intact. Psych: Normal affect.   LAB RESULTS:  Lab Results  Component Value Date   NA 139 08/26/2019   K 3.4 (L) 08/26/2019   CL 108 08/26/2019   CO2 26 08/26/2019   GLUCOSE 135 (H) 08/26/2019   BUN 11 08/26/2019   CREATININE 0.79 08/26/2019   CALCIUM 8.1 (L) 08/26/2019   PROT 7.9 08/25/2019   ALBUMIN 4.4 08/25/2019   AST 70 (H) 08/25/2019   ALT 37 08/25/2019   ALKPHOS 72 08/25/2019   BILITOT 0.8 08/25/2019   GFRNONAA >60 08/26/2019   GFRAA >60 08/26/2019    Lab Results  Component Value Date   WBC 6.8 12/27/2019   NEUTROABS 4.7 12/27/2019   HGB 13.4 12/27/2019   HCT 40.5 12/27/2019   MCV 76.7 (L) 12/27/2019   PLT 113 (L) 12/27/2019   Lab Results  Component Value Date   IRON 50 09/09/2015   TIBC 320 09/09/2015   IRONPCTSAT 16 (L) 09/09/2015   Lab Results  Component Value Date   FERRITIN 75 09/09/2015     STUDIES: No results found.  ASSESSMENT: ITP, rhabdomyolysis.  PLAN:     1.  ITP: Previously, patient received 5 days of 100 mg prednisone with an excellent response to his platelets increasing to greater than 200.  Unfortunately, response was not durable. 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  Splenic ultrasound did not reveal any evidence of splenomegaly.  Bone marrow biopsy was not completed.  Patient completed his first round of weekly Rituxan x4 on October 08, 2017.  He then completed his second round of weekly Rituxan on September 09, 2018.  Patient's platelet count has trended down slightly and is now 113.  No intervention is needed at this time.  Return to clinic  in 6 weeks for laboratory work only and then in 3 months for laboratory work and video assisted telemedicine visit.   2.  Poor venous access: Patient now has a port.  Continue port flushes every 6-8 weeks. 3.  Nontraumatic rhabdomyolysis: Resolved.  CK levels are within normal limits.  This is patient's second admission for rhabdomyolysis.  Monitor.   I provided 20 minutes of face-to-face video visit time during this encounter which included chart review, counseling, and coordination of care as documented above.  Patient expressed understanding and was in agreement with this plan. He also understands that He can call clinic at any time with any questions, concerns, or complaints.    Lloyd Huger, MD   12/30/2019 11:49 AM

## 2019-12-27 ENCOUNTER — Inpatient Hospital Stay: Payer: BC Managed Care – PPO | Attending: Oncology

## 2019-12-27 DIAGNOSIS — D693 Immune thrombocytopenic purpura: Secondary | ICD-10-CM | POA: Diagnosis present

## 2019-12-27 DIAGNOSIS — Z95828 Presence of other vascular implants and grafts: Secondary | ICD-10-CM

## 2019-12-27 DIAGNOSIS — Z452 Encounter for adjustment and management of vascular access device: Secondary | ICD-10-CM | POA: Diagnosis not present

## 2019-12-27 LAB — CBC WITH DIFFERENTIAL/PLATELET
Abs Immature Granulocytes: 0.02 10*3/uL (ref 0.00–0.07)
Basophils Absolute: 0 10*3/uL (ref 0.0–0.1)
Basophils Relative: 0 %
Eosinophils Absolute: 0.1 10*3/uL (ref 0.0–0.5)
Eosinophils Relative: 1 %
HCT: 40.5 % (ref 39.0–52.0)
Hemoglobin: 13.4 g/dL (ref 13.0–17.0)
Immature Granulocytes: 0 %
Lymphocytes Relative: 26 %
Lymphs Abs: 1.7 10*3/uL (ref 0.7–4.0)
MCH: 25.4 pg — ABNORMAL LOW (ref 26.0–34.0)
MCHC: 33.1 g/dL (ref 30.0–36.0)
MCV: 76.7 fL — ABNORMAL LOW (ref 80.0–100.0)
Monocytes Absolute: 0.2 10*3/uL (ref 0.1–1.0)
Monocytes Relative: 4 %
Neutro Abs: 4.7 10*3/uL (ref 1.7–7.7)
Neutrophils Relative %: 69 %
Platelets: 113 10*3/uL — ABNORMAL LOW (ref 150–400)
RBC: 5.28 MIL/uL (ref 4.22–5.81)
RDW: 14 % (ref 11.5–15.5)
WBC: 6.8 10*3/uL (ref 4.0–10.5)
nRBC: 0 % (ref 0.0–0.2)

## 2019-12-27 MED ORDER — SODIUM CHLORIDE 0.9% FLUSH
10.0000 mL | INTRAVENOUS | Status: DC | PRN
  Filled 2019-12-27: qty 10

## 2019-12-27 MED ORDER — HEPARIN SOD (PORK) LOCK FLUSH 100 UNIT/ML IV SOLN
500.0000 [IU] | Freq: Once | INTRAVENOUS | Status: DC
  Filled 2019-12-27: qty 5

## 2019-12-27 MED ORDER — SODIUM CHLORIDE 0.9% FLUSH
10.0000 mL | Freq: Once | INTRAVENOUS | Status: AC
  Administered 2019-12-27: 10 mL via INTRAVENOUS
  Filled 2019-12-27: qty 10

## 2019-12-27 MED ORDER — HEPARIN SOD (PORK) LOCK FLUSH 100 UNIT/ML IV SOLN
500.0000 [IU] | Freq: Once | INTRAVENOUS | Status: AC
  Administered 2019-12-27: 500 [IU] via INTRAVENOUS
  Filled 2019-12-27: qty 5

## 2019-12-27 MED ORDER — HEPARIN SOD (PORK) LOCK FLUSH 100 UNIT/ML IV SOLN
INTRAVENOUS | Status: AC
  Filled 2019-12-27: qty 5

## 2019-12-28 ENCOUNTER — Encounter: Payer: Self-pay | Admitting: Oncology

## 2019-12-28 ENCOUNTER — Inpatient Hospital Stay (HOSPITAL_BASED_OUTPATIENT_CLINIC_OR_DEPARTMENT_OTHER): Payer: BC Managed Care – PPO | Admitting: Oncology

## 2019-12-28 ENCOUNTER — Other Ambulatory Visit: Payer: Self-pay

## 2019-12-28 DIAGNOSIS — D693 Immune thrombocytopenic purpura: Secondary | ICD-10-CM | POA: Diagnosis not present

## 2019-12-28 NOTE — Progress Notes (Signed)
Patient denies new problems/concerns today.   °

## 2020-02-13 ENCOUNTER — Inpatient Hospital Stay: Payer: BC Managed Care – PPO

## 2020-02-27 ENCOUNTER — Other Ambulatory Visit: Payer: Self-pay | Admitting: Oncology

## 2020-02-27 ENCOUNTER — Inpatient Hospital Stay: Payer: BC Managed Care – PPO | Attending: Oncology

## 2020-02-27 ENCOUNTER — Other Ambulatory Visit: Payer: Self-pay | Admitting: *Deleted

## 2020-02-27 DIAGNOSIS — Z452 Encounter for adjustment and management of vascular access device: Secondary | ICD-10-CM | POA: Insufficient documentation

## 2020-02-27 DIAGNOSIS — M6282 Rhabdomyolysis: Secondary | ICD-10-CM | POA: Insufficient documentation

## 2020-02-27 DIAGNOSIS — D693 Immune thrombocytopenic purpura: Secondary | ICD-10-CM | POA: Diagnosis present

## 2020-02-27 DIAGNOSIS — Z5112 Encounter for antineoplastic immunotherapy: Secondary | ICD-10-CM | POA: Insufficient documentation

## 2020-02-27 LAB — CBC WITH DIFFERENTIAL/PLATELET
Abs Immature Granulocytes: 0.02 10*3/uL (ref 0.00–0.07)
Basophils Absolute: 0 10*3/uL (ref 0.0–0.1)
Basophils Relative: 0 %
Eosinophils Absolute: 0.2 10*3/uL (ref 0.0–0.5)
Eosinophils Relative: 3 %
HCT: 40.3 % (ref 39.0–52.0)
Hemoglobin: 13.2 g/dL (ref 13.0–17.0)
Immature Granulocytes: 0 %
Lymphocytes Relative: 24 %
Lymphs Abs: 1.5 10*3/uL (ref 0.7–4.0)
MCH: 25.1 pg — ABNORMAL LOW (ref 26.0–34.0)
MCHC: 32.8 g/dL (ref 30.0–36.0)
MCV: 76.6 fL — ABNORMAL LOW (ref 80.0–100.0)
Monocytes Absolute: 0.3 10*3/uL (ref 0.1–1.0)
Monocytes Relative: 5 %
Neutro Abs: 4.3 10*3/uL (ref 1.7–7.7)
Neutrophils Relative %: 68 %
Platelets: 11 10*3/uL — CL (ref 150–400)
RBC: 5.26 MIL/uL (ref 4.22–5.81)
RDW: 14.6 % (ref 11.5–15.5)
Smear Review: NORMAL
WBC: 6.3 10*3/uL (ref 4.0–10.5)
nRBC: 0 % (ref 0.0–0.2)

## 2020-02-27 MED ORDER — SODIUM CHLORIDE 0.9% FLUSH
10.0000 mL | INTRAVENOUS | Status: AC | PRN
  Administered 2020-02-27: 10 mL via INTRAVENOUS
  Filled 2020-02-27: qty 10

## 2020-02-27 MED ORDER — HEPARIN SOD (PORK) LOCK FLUSH 100 UNIT/ML IV SOLN
INTRAVENOUS | Status: AC
  Filled 2020-02-27: qty 5

## 2020-02-27 MED ORDER — HEPARIN SOD (PORK) LOCK FLUSH 100 UNIT/ML IV SOLN
500.0000 [IU] | Freq: Once | INTRAVENOUS | Status: AC
  Administered 2020-02-27: 500 [IU] via INTRAVENOUS
  Filled 2020-02-27: qty 5

## 2020-02-27 MED ORDER — PREDNISONE 50 MG PO TABS: ORAL_TABLET | ORAL | 0 refills | Status: DC

## 2020-02-28 ENCOUNTER — Other Ambulatory Visit: Payer: Self-pay | Admitting: Oncology

## 2020-02-28 ENCOUNTER — Telehealth: Payer: Self-pay | Admitting: *Deleted

## 2020-02-28 NOTE — Telephone Encounter (Signed)
Call placed to patient to review lab results, plan. Patient to restart on Rituxan infusions on 1/21, patient is scheduled. Patient also to start on Prednisone 100 mg daily for 5 days, prescription sent to patients pharmacy. Patient aware of plan and verbalized understanding and agreement.

## 2020-03-01 ENCOUNTER — Inpatient Hospital Stay: Payer: BC Managed Care – PPO

## 2020-03-01 ENCOUNTER — Encounter: Payer: Self-pay | Admitting: Nurse Practitioner

## 2020-03-01 ENCOUNTER — Inpatient Hospital Stay (HOSPITAL_BASED_OUTPATIENT_CLINIC_OR_DEPARTMENT_OTHER): Payer: BC Managed Care – PPO | Admitting: Nurse Practitioner

## 2020-03-01 VITALS — BP 138/90 | HR 65 | Temp 98.4°F | Resp 20 | Wt 363.4 lb

## 2020-03-01 VITALS — BP 134/72 | HR 65 | Temp 96.2°F | Resp 17

## 2020-03-01 DIAGNOSIS — D693 Immune thrombocytopenic purpura: Secondary | ICD-10-CM | POA: Diagnosis not present

## 2020-03-01 DIAGNOSIS — Z5112 Encounter for antineoplastic immunotherapy: Secondary | ICD-10-CM | POA: Diagnosis not present

## 2020-03-01 DIAGNOSIS — D696 Thrombocytopenia, unspecified: Secondary | ICD-10-CM | POA: Diagnosis not present

## 2020-03-01 MED ORDER — FAMOTIDINE IN NACL 20-0.9 MG/50ML-% IV SOLN
20.0000 mg | Freq: Once | INTRAVENOUS | Status: AC | PRN
  Administered 2020-03-01: 20 mg via INTRAVENOUS

## 2020-03-01 MED ORDER — ACETAMINOPHEN 325 MG PO TABS
650.0000 mg | ORAL_TABLET | Freq: Once | ORAL | Status: AC
  Administered 2020-03-01: 650 mg via ORAL
  Filled 2020-03-01: qty 2

## 2020-03-01 MED ORDER — SODIUM CHLORIDE 0.9 % IV SOLN
375.0000 mg/m2 | Freq: Once | INTRAVENOUS | Status: AC
  Administered 2020-03-01: 1000 mg via INTRAVENOUS
  Filled 2020-03-01: qty 100

## 2020-03-01 MED ORDER — SODIUM CHLORIDE 0.9 % IV SOLN
20.0000 mg | Freq: Once | INTRAVENOUS | Status: AC
  Administered 2020-03-01: 20 mg via INTRAVENOUS
  Filled 2020-03-01: qty 20

## 2020-03-01 MED ORDER — DIPHENHYDRAMINE HCL 50 MG/ML IJ SOLN
50.0000 mg | Freq: Once | INTRAMUSCULAR | Status: AC
  Administered 2020-03-01: 50 mg via INTRAVENOUS
  Filled 2020-03-01: qty 1

## 2020-03-01 MED ORDER — SODIUM CHLORIDE 0.9 % IV SOLN
Freq: Once | INTRAVENOUS | Status: AC
  Filled 2020-03-01: qty 250

## 2020-03-01 MED ORDER — SODIUM CHLORIDE 0.9 % IV SOLN
Freq: Once | INTRAVENOUS | Status: AC | PRN
  Filled 2020-03-01: qty 250

## 2020-03-01 MED ORDER — FAMOTIDINE IN NACL 20-0.9 MG/50ML-% IV SOLN
20.0000 mg | Freq: Two times a day (BID) | INTRAVENOUS | Status: DC
  Administered 2020-03-01: 20 mg via INTRAVENOUS
  Filled 2020-03-01: qty 50

## 2020-03-01 MED ORDER — METHYLPREDNISOLONE SODIUM SUCC 125 MG IJ SOLR
125.0000 mg | Freq: Once | INTRAMUSCULAR | Status: AC | PRN
  Administered 2020-03-01: 125 mg via INTRAVENOUS

## 2020-03-01 MED ORDER — DIPHENHYDRAMINE HCL 50 MG/ML IJ SOLN
25.0000 mg | Freq: Once | INTRAMUSCULAR | Status: AC
  Administered 2020-03-01: 25 mg via INTRAVENOUS

## 2020-03-01 MED ORDER — HEPARIN SOD (PORK) LOCK FLUSH 100 UNIT/ML IV SOLN
500.0000 [IU] | Freq: Once | INTRAVENOUS | Status: AC | PRN
Start: 2020-03-01 — End: 2020-03-01
  Administered 2020-03-01: 500 [IU]
  Filled 2020-03-01: qty 5

## 2020-03-01 NOTE — Progress Notes (Signed)
1218: Pt reports a scratchy throat and states it is "unbearable" Pt states that he is not having any difficulty breathing at this time, and denies any other symptoms at this time. Rituximab paused and Consuello Masse NP notified.  1220: Pt begin to develop a cough, pt continues to deny any other symptoms. Consuello Masse NP at chairside 1230: Pt reports symptoms are improving, but not resolved and no new symptoms at this time. Per Consuello Masse NP give 250cc over 15 minutes and restart Rituxiamb at the previous rate.   1249: Pt reports symptoms improved significantly, pt agrees to restart Rituximab at this time. Rituximab restarted at previous rate.  VS remained stable.  **see flow sheets for VS, and MAR for Medications**    1646: Pt tolerated remainder of infusion well. No s/s of distress or reaction noted. Pt and VS stable at discharge.

## 2020-03-01 NOTE — Progress Notes (Signed)
Patient here for oncology follow-up appointment, expresses complaints of some shortness of breath

## 2020-03-01 NOTE — Progress Notes (Signed)
[place]  [phone] [phone]  ID: Samuel Rojas OB: 30-Nov-1990  MR#: [id number]  VXB#:[id number]  Patient Care Team: Center, [facility] as PCP - General (General Practice) Lloyd Huger, MD as Consulting Physician (Hematology and Oncology)  CHIEF COMPLAINT: ITP and rhabdomyelosis  INTERVAL HISTORY: Patient is a 30 year old male with history of ITP and rhabdomyolysis who returns to clinic for further evaluation and consideration of restarting rituximab.  Overall he feels well.  Has experienced gum bleeding.  Unaware of bruising.  No neurologic complaints.  No recent fevers or illness.  No chest pain, shortness of breath, cough, hemoptysis.  No nausea, vomiting, constipation, or diarrhea.  No urinary complaints.  No other specific complaints today.  REVIEW OF SYSTEMS:   Review of Systems  Constitutional: Negative.  Negative for fever, malaise/fatigue and weight loss.  HENT: Negative.  Negative for nosebleeds.   Respiratory: Negative.  Negative for cough, hemoptysis and shortness of breath.   Cardiovascular: Negative.  Negative for chest pain and leg swelling.  Gastrointestinal: Negative.  Negative for abdominal pain, blood in stool and melena.  Genitourinary: Negative.  Negative for hematuria.  Musculoskeletal: Negative.  Negative for back pain and myalgias.  Skin: Negative.  Negative for rash.  Neurological: Negative.  Negative for sensory change, focal weakness and weakness.  Endo/Heme/Allergies: Bruises/bleeds easily.  Psychiatric/Behavioral: Negative.  The patient is not nervous/anxious.   As per HPI. Otherwise, a complete review of systems is negative.  PAST MEDICAL HISTORY: Past Medical History:  Diagnosis Date  . Cellulitis    left leg  . Idiopathic thrombocytopenic purpura (ITP) (HCC)     PAST SURGICAL HISTORY: Past Surgical History:  Procedure Laterality Date  . PORTA CATH INSERTION N/A  09/20/2017   Procedure: PORTA CATH INSERTION;  Surgeon: Algernon Huxley, MD;  Location: [facility];  Service: Cardiovascular;  Laterality: N/A;  . TONSILLECTOMY      FAMILY HISTORY: Family History  Problem Relation Age of Onset  . Hypertension Mother   . Diabetes Mother      ADVANCED DIRECTIVES:    HEALTH MAINTENANCE: Social History   Tobacco Use  . Smoking status: Never Smoker  . Smokeless tobacco: Never Used  Vaping Use  . Vaping Use: Never used  Substance Use Topics  . Alcohol use: No  . Drug use: No     Colonoscopy:  Lipid panel:  Allergies  Allergen Reactions  . Augmentin [Amoxicillin-Pot Clavulanate] Hives  . Sulfamethoxazole-Trimethoprim     Dehydration, confusion , high BP    Current Outpatient Medications  Medication Sig Dispense Refill  . predniSONE (DELTASONE) 50 MG tablet Take 2 tablets (100 mg) daily for 5 days. 10 tablet 0   No current facility-administered medications for this visit.   Facility-Administered Medications Ordered in Other Visits  Medication Dose Route Frequency Provider Last Rate Last Admin  . famotidine (PEPCID) IVPB 20 mg premix  20 mg Intravenous Q12H Lloyd Huger, MD   Stopped at 10/01/17 1001  . famotidine (PEPCID) IVPB 20 mg premix  20 mg Intravenous Q12H Lloyd Huger, MD 100 mL/hr at 03/01/20 1025 20 mg at 03/01/20 1025  . heparin lock flush 100 unit/mL  500 Units Intracatheter Once PRN Lloyd Huger, MD      . riTUXimab-pvvr (RUXIENCE) 1,000 mg in sodium chloride 0.9 % 250 mL (2.8571 mg/mL) infusion  375 mg/m2 (Treatment Plan Recorded) Intravenous Once Lloyd Huger, MD      . sodium  chloride flush (NS) 0.9 % injection 10 mL  10 mL Intracatheter PRN Lloyd Huger, MD   10 mL at 10/01/17 0855  . sodium chloride flush (NS) 0.9 % injection 10 mL  10 mL Intravenous PRN Lloyd Huger, MD   10 mL at 02/27/20 1450   OBJECTIVE: Vitals:   03/01/20 0904  BP: 138/90  Pulse: 65  Resp: 20   Temp: 98.4 F (36.9 C)  SpO2: 100%     Body mass index is 58.65 kg/m.    ECOG FS:1 - Symptomatic but completely ambulatory  General: Well-developed, well-nourished, no acute distress. Obese Eyes: Pink conjunctiva, anicteric sclera. Lungs: Clear to auscultation bilaterally.  No audible wheezing or coughing Heart: Regular rate and rhythm.  Abdomen: Soft, nontender, nondistended.  Musculoskeletal: No edema, cyanosis, or clubbing. Neuro: Alert, answering all questions appropriately. Cranial nerves grossly intact. Skin: No rashes or petechiae noted. Psych: Normal affect.  LAB RESULTS:  Lab Results  Component Value Date   NA 139 08/26/2019   K 3.4 (L) 08/26/2019   CL 108 08/26/2019   CO2 26 08/26/2019   GLUCOSE 135 (H) 08/26/2019   BUN 11 08/26/2019   CREATININE 0.79 08/26/2019   CALCIUM 8.1 (L) 08/26/2019   PROT 7.9 08/25/2019   ALBUMIN 4.4 08/25/2019   AST 70 (H) 08/25/2019   ALT 37 08/25/2019   ALKPHOS 72 08/25/2019   BILITOT 0.8 08/25/2019   GFRNONAA >60 08/26/2019   GFRAA >60 08/26/2019    Lab Results  Component Value Date   WBC 6.3 02/27/2020   NEUTROABS 4.3 02/27/2020   HGB 13.2 02/27/2020   HCT 40.3 02/27/2020   MCV 76.6 (L) 02/27/2020   PLT 11 (LL) 02/27/2020   Lab Results  Component Value Date   IRON 50 09/09/2015   TIBC 320 09/09/2015   IRONPCTSAT 16 (L) 09/09/2015   Lab Results  Component Value Date   FERRITIN 75 09/09/2015    STUDIES: No results found.  ASSESSMENT: ITP, rhabdomyolysis.  PLAN:     1.  ITP: Previously, patient received 5 days of 100 mg prednisone with excellent response to thrombocytopenia with platelets increasing to greater than 200.  Unfortunately, response was not durable.  Previously, his entire work-up was negative therefore most likely diagnosis is ITP.  Splenic ultrasound did not reveal evidence of splenomegaly.  Bone marrow biopsy was not completed.  Patient completed first round of weekly Rituxan x 4 10/08/2017 with  good response. Second round of weekly rituxan x 4 on 09/09/2019.  Since that time, patient's platelet count is down trended.  Most recently platelet count 11 and he became symptomatic.  Has not picked up prescription for prednisone which I encouraged him to do.  Plan to restart Rituxan weekly x 4 today. He's tolerated treatment well in the past without significant side effects. Side effects and symptoms of infusion reaction again reviewed today. Patient wishes to proceed.  Return to clinic in 1 week for labs, reevaluation and consideration of cycle 2 of Rituxan. 2.  Poor venous access.  Patient has port.  Continue port flushes every 6 to 8 weeks. 3.  Nontraumatic rhabdomyolysis-resolved.  Patient has had 2 hospital admissions for rhabdomyolysis.  CK limits have been within normal limits. Last was 76 on 09/22/19. Monitor.   Patient expressed understanding and was in agreement with this plan. He also understands that He can call clinic at any time with any questions, concerns, or complaints.    Verlon Au, NP   03/01/2020 10:35  AM     

## 2020-03-04 ENCOUNTER — Ambulatory Visit: Payer: BC Managed Care – PPO | Admitting: Oncology

## 2020-03-04 ENCOUNTER — Ambulatory Visit: Payer: BC Managed Care – PPO

## 2020-03-08 ENCOUNTER — Inpatient Hospital Stay: Payer: BC Managed Care – PPO

## 2020-03-08 ENCOUNTER — Other Ambulatory Visit: Payer: Self-pay

## 2020-03-08 ENCOUNTER — Inpatient Hospital Stay (HOSPITAL_BASED_OUTPATIENT_CLINIC_OR_DEPARTMENT_OTHER): Payer: BC Managed Care – PPO | Admitting: Oncology

## 2020-03-08 VITALS — BP 143/87 | HR 76 | Temp 95.8°F | Resp 18

## 2020-03-08 DIAGNOSIS — D693 Immune thrombocytopenic purpura: Secondary | ICD-10-CM | POA: Diagnosis not present

## 2020-03-08 DIAGNOSIS — M6282 Rhabdomyolysis: Secondary | ICD-10-CM

## 2020-03-08 DIAGNOSIS — D696 Thrombocytopenia, unspecified: Secondary | ICD-10-CM

## 2020-03-08 DIAGNOSIS — Z5112 Encounter for antineoplastic immunotherapy: Secondary | ICD-10-CM | POA: Diagnosis not present

## 2020-03-08 LAB — CBC WITH DIFFERENTIAL/PLATELET
Abs Immature Granulocytes: 0.02 10*3/uL (ref 0.00–0.07)
Basophils Absolute: 0 10*3/uL (ref 0.0–0.1)
Basophils Relative: 0 %
Eosinophils Absolute: 0.1 10*3/uL (ref 0.0–0.5)
Eosinophils Relative: 2 %
HCT: 40.3 % (ref 39.0–52.0)
Hemoglobin: 13.2 g/dL (ref 13.0–17.0)
Immature Granulocytes: 0 %
Lymphocytes Relative: 20 %
Lymphs Abs: 1.2 10*3/uL (ref 0.7–4.0)
MCH: 25.3 pg — ABNORMAL LOW (ref 26.0–34.0)
MCHC: 32.8 g/dL (ref 30.0–36.0)
MCV: 77.2 fL — ABNORMAL LOW (ref 80.0–100.0)
Monocytes Absolute: 0.4 10*3/uL (ref 0.1–1.0)
Monocytes Relative: 6 %
Neutro Abs: 4.2 10*3/uL (ref 1.7–7.7)
Neutrophils Relative %: 72 %
Platelets: 13 10*3/uL — CL (ref 150–400)
RBC: 5.22 MIL/uL (ref 4.22–5.81)
RDW: 14.6 % (ref 11.5–15.5)
Smear Review: NORMAL
WBC: 5.9 10*3/uL (ref 4.0–10.5)
nRBC: 0 % (ref 0.0–0.2)

## 2020-03-08 MED ORDER — FAMOTIDINE IN NACL 20-0.9 MG/50ML-% IV SOLN
20.0000 mg | Freq: Two times a day (BID) | INTRAVENOUS | Status: DC
  Administered 2020-03-08: 20 mg via INTRAVENOUS
  Filled 2020-03-08: qty 50

## 2020-03-08 MED ORDER — SODIUM CHLORIDE 0.9 % IV SOLN
375.0000 mg/m2 | Freq: Once | INTRAVENOUS | Status: AC
  Administered 2020-03-08: 1000 mg via INTRAVENOUS
  Filled 2020-03-08: qty 100

## 2020-03-08 MED ORDER — DIPHENHYDRAMINE HCL 50 MG/ML IJ SOLN
50.0000 mg | Freq: Once | INTRAMUSCULAR | Status: AC
Start: 2020-03-08 — End: 2020-03-08
  Administered 2020-03-08: 50 mg via INTRAVENOUS
  Filled 2020-03-08: qty 1

## 2020-03-08 MED ORDER — SODIUM CHLORIDE 0.9 % IV SOLN
20.0000 mg | Freq: Once | INTRAVENOUS | Status: AC
  Administered 2020-03-08: 20 mg via INTRAVENOUS
  Filled 2020-03-08: qty 20

## 2020-03-08 MED ORDER — SODIUM CHLORIDE 0.9 % IV SOLN
Freq: Once | INTRAVENOUS | Status: AC
  Filled 2020-03-08: qty 250

## 2020-03-08 MED ORDER — HEPARIN SOD (PORK) LOCK FLUSH 100 UNIT/ML IV SOLN
500.0000 [IU] | Freq: Once | INTRAVENOUS | Status: AC | PRN
  Administered 2020-03-08: 500 [IU]
  Filled 2020-03-08: qty 5

## 2020-03-08 MED ORDER — ACETAMINOPHEN 325 MG PO TABS
650.0000 mg | ORAL_TABLET | Freq: Once | ORAL | Status: AC
  Administered 2020-03-08: 650 mg via ORAL
  Filled 2020-03-08: qty 2

## 2020-03-08 MED ORDER — SODIUM CHLORIDE 0.9% FLUSH
10.0000 mL | INTRAVENOUS | Status: DC | PRN
Start: 2020-03-08 — End: 2020-03-08
  Filled 2020-03-08: qty 10

## 2020-03-08 NOTE — Progress Notes (Signed)
[place]  Telephone:(336) [phone] [phone]) [phone]  ID: Samuel Rojas OB: 1990-08-01  MR#: [id number]  OJJ#:[id number]  Patient Care Team: Center, [facility] as PCP - General (General Practice) Lloyd Huger, MD as Consulting Physician (Hematology and Oncology)  CHIEF COMPLAINT: ITP and rhabdomyelosis  INTERVAL HISTORY: Patient is a 30 year old male with history of ITP and rhabdomyolysis who returns to clinic for further evaluation and consideration of cycle 2 rituximab. Last infusion was on 03/01/20.  Overall he feels well.  Denies recurrent gum bleeding. Had mild rate based reaction on third bump last week with symptoms of a scratchy throat which resolved with extra steroids. Unfortunately he was unable to sleep for several days after infusion.  Unaware of bruising.  No neurologic complaints.  No recent fevers or illness.  No chest pain, shortness of breath, cough, hemoptysis.  No nausea, vomiting, constipation, or diarrhea.  No urinary complaints.  No other specific complaints today.  REVIEW OF SYSTEMS:   Review of Systems  Constitutional: Negative.  Negative for fever, malaise/fatigue and weight loss.  HENT: Negative.  Negative for nosebleeds.   Respiratory: Negative.  Negative for cough, hemoptysis and shortness of breath.   Cardiovascular: Negative.  Negative for chest pain and leg swelling.  Gastrointestinal: Negative.  Negative for abdominal pain, blood in stool and melena.  Genitourinary: Negative.  Negative for hematuria.  Musculoskeletal: Negative.  Negative for back pain and myalgias.  Skin: Negative.  Negative for rash.  Neurological: Negative.  Negative for sensory change, focal weakness and weakness.  Endo/Heme/Allergies: Bruises/bleeds easily.  Psychiatric/Behavioral: Negative.  The patient is not nervous/anxious.   As per HPI. Otherwise, a complete review of systems is negative.  PAST MEDICAL HISTORY: Past Medical  History:  Diagnosis Date  . Cellulitis    left leg  . Idiopathic thrombocytopenic purpura (ITP) (HCC)     PAST SURGICAL HISTORY: Past Surgical History:  Procedure Laterality Date  . PORTA CATH INSERTION N/A 09/20/2017   Procedure: PORTA CATH INSERTION;  Surgeon: Algernon Huxley, MD;  Location: [facility];  Service: Cardiovascular;  Laterality: N/A;  . TONSILLECTOMY      FAMILY HISTORY: Family History  Problem Relation Age of Onset  . Hypertension Mother   . Diabetes Mother      ADVANCED DIRECTIVES:    HEALTH MAINTENANCE: Social History   Tobacco Use  . Smoking status: Never Smoker  . Smokeless tobacco: Never Used  Vaping Use  . Vaping Use: Never used  Substance Use Topics  . Alcohol use: No  . Drug use: No     Colonoscopy:  Lipid panel:  Allergies  Allergen Reactions  . Augmentin [Amoxicillin-Pot Clavulanate] Hives  . Sulfamethoxazole-Trimethoprim     Dehydration, confusion , high BP    Current Outpatient Medications  Medication Sig Dispense Refill  . predniSONE (DELTASONE) 50 MG tablet Take 2 tablets (100 mg) daily for 5 days. 10 tablet 0   No current facility-administered medications for this visit.   Facility-Administered Medications Ordered in Other Visits  Medication Dose Route Frequency Provider Last Rate Last Admin  . famotidine (PEPCID) IVPB 20 mg premix  20 mg Intravenous Q12H Lloyd Huger, MD   Stopped at 10/01/17 1001  . sodium chloride flush (NS) 0.9 % injection 10 mL  10 mL Intracatheter PRN Lloyd Huger, MD   10 mL at 10/01/17 0855  . sodium chloride flush (NS) 0.9 % injection 10 mL  10 mL Intravenous PRN Delight Hoh  J, MD   10 mL at 02/27/20 1450   OBJECTIVE: There were no vitals filed for this visit.   There is no height or weight on file to calculate BMI.    ECOG FS:1 - Symptomatic but completely ambulatory  Physical Exam Constitutional:      General: Vital signs are normal.     Appearance: Normal appearance.  He is obese.  HENT:     Head: Normocephalic and atraumatic.  Eyes:     Pupils: Pupils are equal, round, and reactive to light.  Cardiovascular:     Rate and Rhythm: Normal rate and regular rhythm.     Heart sounds: Normal heart sounds. No murmur heard.   Pulmonary:     Effort: Pulmonary effort is normal.     Breath sounds: Normal breath sounds. No wheezing.  Abdominal:     General: Bowel sounds are normal. There is no distension.     Palpations: Abdomen is soft.     Tenderness: There is no abdominal tenderness.  Musculoskeletal:        General: No edema. Normal range of motion.     Cervical back: Normal range of motion.  Skin:    General: Skin is warm and dry.     Findings: No rash.  Neurological:     Mental Status: He is alert and oriented to person, place, and time.  Psychiatric:        Judgment: Judgment normal.      LAB RESULTS:  Lab Results  Component Value Date   NA 139 08/26/2019   K 3.4 (L) 08/26/2019   CL 108 08/26/2019   CO2 26 08/26/2019   GLUCOSE 135 (H) 08/26/2019   BUN 11 08/26/2019   CREATININE 0.79 08/26/2019   CALCIUM 8.1 (L) 08/26/2019   PROT 7.9 08/25/2019   ALBUMIN 4.4 08/25/2019   AST 70 (H) 08/25/2019   ALT 37 08/25/2019   ALKPHOS 72 08/25/2019   BILITOT 0.8 08/25/2019   GFRNONAA >60 08/26/2019   GFRAA >60 08/26/2019    Lab Results  Component Value Date   WBC 5.9 03/08/2020   NEUTROABS PENDING 03/08/2020   HGB 13.2 03/08/2020   HCT 40.3 03/08/2020   MCV 77.2 (L) 03/08/2020   PLT 13 (LL) 03/08/2020   Lab Results  Component Value Date   IRON 50 09/09/2015   TIBC 320 09/09/2015   IRONPCTSAT 16 (L) 09/09/2015   Lab Results  Component Value Date   FERRITIN 75 09/09/2015    STUDIES: No results found.  ASSESSMENT: ITP, rhabdomyolysis.  PLAN:     1.  ITP:  -Secondary to ITP -Initially discovered in May 2017 when he presented with a platelet count of 42,000. -Abdominal ultrasound from 09/16/2015 did not reveal any  splenomegaly. -Unable to complete bone marrow biopsy secondary to body habitus -Received 5 days of 100 mg prednisone on 10/07/15 with excellent response of platelet count > 200. -He was lost to follow-up and presented back to [facility] ED on 07/01/2017 for nosebleed and found to have a platelet count of 15,000.  He was given additional steroids and sent home.  He was referred back to hematology. -Labs from 08/30/2017 show platelet count of 24,000. -He was started on weekly Rituxan x4 on 09/17/17-10/08/17 with improvement of platelet count to >100.  -He has received Rituxan intermittently for the past several years as his platelet count declines. -He was given Rituxan on 08/19/2018-09/09/18 for plt count of 13,000 and again on 08/25/2019-09/08/2019 for platelet count of 26,000.  -  Most recently he presented back with a platelet count of 11,000 on 02/27/2020. -He will receive 4 doses of IV Rituxan first given last week on 03/01/2020. -Had rate based reaction with cycle 1 with symptoms of a scratchy throat.  He was given additional dose of steroids.  Unfortunately, this made him unable to sleep for several days post infusion.  We will try to avoid this in the future.  -Proceed with Rituxan today but at decreased rate-discussed with pharmacy. -RTC in 1 week for cycle 3 Rituxan, labs and assessment.  2.  Poor venous access.   -Patient has port.  -Continue port flushes every 6 to 8 weeks.  3.  Nontraumatic rhabdomyolysis- -Resolved.   -Patient has had 2 hospital admissions for rhabdomyolysis.  -CK limits have been within normal limits.  -Last was 76 on 09/22/19. -Continue to monitor  Disposition: -RTC in 1 week for repeat labs, MD assessment and cycle 3 Rituxan.  Greater than 50% was spent in counseling and coordination of care with this patient including but not limited to discussion of the relevant topics above (See A&P) including, but not limited to diagnosis and management of acute and chronic medical  conditions.    Patient expressed understanding and was in agreement with this plan. He also understands that He can call clinic at any time with any questions, concerns, or complaints.    Jacquelin Hawking, NP   03/08/2020 8:59 AM

## 2020-03-08 NOTE — Progress Notes (Signed)
Patient received prescribed treatment in clinic. Ruxience started at 100 mg/hour and titrated an additional 100 mg/hour every 30 minutes until reached the maximum of 400 mg/hour dosing.Tolerated well. Patient stable at discharge 

## 2020-03-14 ENCOUNTER — Encounter: Payer: Self-pay | Admitting: Oncology

## 2020-03-14 ENCOUNTER — Inpatient Hospital Stay (HOSPITAL_BASED_OUTPATIENT_CLINIC_OR_DEPARTMENT_OTHER): Payer: BC Managed Care – PPO | Admitting: Oncology

## 2020-03-14 ENCOUNTER — Other Ambulatory Visit: Payer: Self-pay

## 2020-03-14 ENCOUNTER — Inpatient Hospital Stay: Payer: BC Managed Care – PPO | Attending: Oncology

## 2020-03-14 VITALS — BP 135/88 | HR 89 | Temp 99.2°F | Resp 20 | Wt 360.0 lb

## 2020-03-14 DIAGNOSIS — D693 Immune thrombocytopenic purpura: Secondary | ICD-10-CM | POA: Diagnosis present

## 2020-03-14 DIAGNOSIS — Z5112 Encounter for antineoplastic immunotherapy: Secondary | ICD-10-CM | POA: Diagnosis present

## 2020-03-14 DIAGNOSIS — M6282 Rhabdomyolysis: Secondary | ICD-10-CM | POA: Diagnosis present

## 2020-03-14 LAB — CBC WITH DIFFERENTIAL/PLATELET
Abs Immature Granulocytes: 0.02 10*3/uL (ref 0.00–0.07)
Basophils Absolute: 0 10*3/uL (ref 0.0–0.1)
Basophils Relative: 0 %
Eosinophils Absolute: 0.1 10*3/uL (ref 0.0–0.5)
Eosinophils Relative: 1 %
HCT: 43.6 % (ref 39.0–52.0)
Hemoglobin: 14.2 g/dL (ref 13.0–17.0)
Immature Granulocytes: 0 %
Lymphocytes Relative: 18 %
Lymphs Abs: 1.3 10*3/uL (ref 0.7–4.0)
MCH: 25 pg — ABNORMAL LOW (ref 26.0–34.0)
MCHC: 32.6 g/dL (ref 30.0–36.0)
MCV: 76.6 fL — ABNORMAL LOW (ref 80.0–100.0)
Monocytes Absolute: 0.3 10*3/uL (ref 0.1–1.0)
Monocytes Relative: 4 %
Neutro Abs: 5.3 10*3/uL (ref 1.7–7.7)
Neutrophils Relative %: 77 %
Platelets: 22 10*3/uL — CL (ref 150–400)
RBC: 5.69 MIL/uL (ref 4.22–5.81)
RDW: 14.6 % (ref 11.5–15.5)
WBC: 7 10*3/uL (ref 4.0–10.5)
nRBC: 0 % (ref 0.0–0.2)

## 2020-03-14 LAB — CK: Total CK: 67 U/L (ref 49–397)

## 2020-03-14 NOTE — Progress Notes (Signed)
[facility]  [phone] [phone]  ID: Makoto D Skoda OB: 12/27/1990  MR#: [id number]  CSN#:[id number]  Patient Care Team: Center, [facility] as PCP - General (General Practice) Finnegan, Timothy J, MD as Consulting Physician (Hematology and Oncology)    CHIEF COMPLAINT: ITP, rhabdomyolysis.  INTERVAL HISTORY: Patient returns to clinic today for laboratory work and further evaluation prior to cycle 3 of weekly Rituxan.  He admits he never picked up his prednisone prescription until recently.  He currently feels well and is asymptomatic. He denies any easy bleeding or bruising.  He has no neurologic complaints. He denies any recent fevers or illnesses. He has no chest pain, shortness of breath, cough, or hemoptysis.  He denies any nausea, vomiting, constipation, or diarrhea. He has no urinary complaints.  Patient offers no specific complaints today.  REVIEW OF SYSTEMS:   Review of Systems  Constitutional: Negative.  Negative for fever, malaise/fatigue and weight loss.  HENT: Negative.  Negative for nosebleeds.   Respiratory: Negative.  Negative for cough, hemoptysis and shortness of breath.   Cardiovascular: Negative.  Negative for chest pain and leg swelling.  Gastrointestinal: Negative.  Negative for abdominal pain, blood in stool and melena.  Genitourinary: Negative.  Negative for hematuria.  Musculoskeletal: Negative.  Negative for back pain and myalgias.  Skin: Negative.  Negative for rash.  Neurological: Negative.  Negative for sensory change, focal weakness and weakness.  Endo/Heme/Allergies: Does not bruise/bleed easily.  Psychiatric/Behavioral: Negative.  The patient is not nervous/anxious.     As per HPI. Otherwise, a complete review of systems is negative.  PAST MEDICAL HISTORY: Past Medical History:  Diagnosis Date  . Cellulitis    left leg  . Idiopathic thrombocytopenic purpura (ITP) (HCC)      PAST SURGICAL HISTORY: Past Surgical History:  Procedure Laterality Date  . PORTA CATH INSERTION N/A 09/20/2017   Procedure: PORTA CATH INSERTION;  Surgeon: Dew, Jason S, MD;  Location: [facility];  Service: Cardiovascular;  Laterality: N/A;  . TONSILLECTOMY      FAMILY HISTORY: Family History  Problem Relation Age of Onset  . Hypertension Mother   . Diabetes Mother        ADVANCED DIRECTIVES:    HEALTH MAINTENANCE: Social History   Tobacco Use  . Smoking status: Never Smoker  . Smokeless tobacco: Never Used  Vaping Use  . Vaping Use: Never used  Substance Use Topics  . Alcohol use: No  . Drug use: No     Colonoscopy:  PAP:  Bone density:  Lipid panel:  Allergies  Allergen Reactions  . Augmentin [Amoxicillin-Pot Clavulanate] Hives  . Sulfamethoxazole-Trimethoprim     Dehydration, confusion , high BP    Current Outpatient Medications  Medication Sig Dispense Refill  . predniSONE (DELTASONE) 50 MG tablet Take 2 tablets (100 mg) daily for 5 days. (Patient not taking: Reported on 03/14/2020) 10 tablet 0   No current facility-administered medications for this visit.   Facility-Administered Medications Ordered in Other Visits  Medication Dose Route Frequency Provider Last Rate Last Admin  . famotidine (PEPCID) IVPB 20 mg premix  20 mg Intravenous Q12H Finnegan, Timothy J, MD   Stopped at 10/01/17 1001  . sodium chloride flush (NS) 0.9 % injection 10 mL  10 mL Intracatheter PRN Finnegan, Timothy J, MD   10 mL at 10/01/17 0855  . sodium chloride flush (NS) 0.9 % injection 10 mL  10 mL Intravenous PRN Finnegan, Timothy J, MD     10 mL at 02/27/20 1450    OBJECTIVE: Vitals:   03/14/20 0937  BP: 135/88  Pulse: 89  Resp: 20  Temp: 99.2 F (37.3 C)     Body mass index is 58.11 kg/m.    ECOG FS:0 - Asymptomatic  General: Well-developed, well-nourished, no acute distress. Eyes: Pink conjunctiva, anicteric sclera. HEENT: Normocephalic, moist mucous  membranes. Lungs: No audible wheezing or coughing. Heart: Regular rate and rhythm. Abdomen: Soft, nontender, no obvious distention. Musculoskeletal: No edema, cyanosis, or clubbing. Neuro: Alert, answering all questions appropriately. Cranial nerves grossly intact. Skin: No rashes or petechiae noted. Psych: Normal affect.   LAB RESULTS:  Lab Results  Component Value Date   NA 139 08/26/2019   K 3.4 (L) 08/26/2019   CL 108 08/26/2019   CO2 26 08/26/2019   GLUCOSE 135 (H) 08/26/2019   BUN 11 08/26/2019   CREATININE 0.79 08/26/2019   CALCIUM 8.1 (L) 08/26/2019   PROT 7.9 08/25/2019   ALBUMIN 4.4 08/25/2019   AST 70 (H) 08/25/2019   ALT 37 08/25/2019   ALKPHOS 72 08/25/2019   BILITOT 0.8 08/25/2019   GFRNONAA >60 08/26/2019   GFRAA >60 08/26/2019    Lab Results  Component Value Date   WBC 7.0 03/14/2020   NEUTROABS 5.3 03/14/2020   HGB 14.2 03/14/2020   HCT 43.6 03/14/2020   MCV 76.6 (L) 03/14/2020   PLT 22 (LL) 03/14/2020   Lab Results  Component Value Date   IRON 50 09/09/2015   TIBC 320 09/09/2015   IRONPCTSAT 16 (L) 09/09/2015   Lab Results  Component Value Date   FERRITIN 75 09/09/2015     STUDIES: No results found.  ASSESSMENT: ITP, rhabdomyolysis.  PLAN:     1.  ITP: Previously, patient received 5 days of 100 mg prednisone with an excellent response to his platelets increasing to greater than 200.  Unfortunately, response was not durable. 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  Splenic ultrasound did not reveal any evidence of splenomegaly.  Bone marrow biopsy was not completed.  Patient completed his first round of weekly Rituxan x4 on October 08, 2017.  He then completed his second round of weekly Rituxan on September 09, 2018.  More recently, patient's platelet count trended to 11 and he was initiated once again on weekly Rituxan.  Typically he has a delayed increase in his platelet count.  Return to clinic  tomorrow for cycle 3 and in 1 week for further evaluation and consideration of cycle 4.  Patient will then have laboratory work and video assisted telemedicine visit 1 month after cycle 4. 2.  Poor venous access: Patient now has a port.  Continue port flushes every 6-8 weeks. 3.  Nontraumatic rhabdomyolysis: Resolved.  CK levels are within normal limits.  This is patient's second admission for rhabdomyolysis.  Monitor.  I spent a total of 30 minutes reviewing chart data, face-to-face evaluation with the patient, counseling and coordination of care as detailed above.  Patient expressed understanding and was in agreement with this plan. He also understands that He can call clinic at any time with any questions, concerns, or complaints.    Lloyd Huger, MD   03/14/2020 12:26 PM

## 2020-03-14 NOTE — Progress Notes (Signed)
Patient denies any concerns today.  

## 2020-03-15 ENCOUNTER — Inpatient Hospital Stay: Payer: BC Managed Care – PPO

## 2020-03-15 ENCOUNTER — Other Ambulatory Visit: Payer: Self-pay

## 2020-03-15 VITALS — BP 107/77 | HR 61 | Temp 96.3°F | Resp 18

## 2020-03-15 DIAGNOSIS — D693 Immune thrombocytopenic purpura: Secondary | ICD-10-CM | POA: Diagnosis not present

## 2020-03-15 DIAGNOSIS — D696 Thrombocytopenia, unspecified: Secondary | ICD-10-CM

## 2020-03-15 DIAGNOSIS — Z5112 Encounter for antineoplastic immunotherapy: Secondary | ICD-10-CM | POA: Diagnosis not present

## 2020-03-15 MED ORDER — FAMOTIDINE IN NACL 20-0.9 MG/50ML-% IV SOLN
20.0000 mg | Freq: Two times a day (BID) | INTRAVENOUS | Status: DC
  Administered 2020-03-15: 20 mg via INTRAVENOUS
  Filled 2020-03-15: qty 50

## 2020-03-15 MED ORDER — ACETAMINOPHEN 325 MG PO TABS
650.0000 mg | ORAL_TABLET | Freq: Once | ORAL | Status: AC
  Administered 2020-03-15: 650 mg via ORAL
  Filled 2020-03-15: qty 2

## 2020-03-15 MED ORDER — SODIUM CHLORIDE 0.9 % IV SOLN
20.0000 mg | Freq: Once | INTRAVENOUS | Status: AC
  Administered 2020-03-15: 20 mg via INTRAVENOUS
  Filled 2020-03-15: qty 20

## 2020-03-15 MED ORDER — SODIUM CHLORIDE 0.9 % IV SOLN
375.0000 mg/m2 | Freq: Once | INTRAVENOUS | Status: AC
  Administered 2020-03-15: 1000 mg via INTRAVENOUS
  Filled 2020-03-15: qty 100

## 2020-03-15 MED ORDER — HEPARIN SOD (PORK) LOCK FLUSH 100 UNIT/ML IV SOLN
INTRAVENOUS | Status: AC
  Filled 2020-03-15: qty 5

## 2020-03-15 MED ORDER — HEPARIN SOD (PORK) LOCK FLUSH 100 UNIT/ML IV SOLN
500.0000 [IU] | Freq: Once | INTRAVENOUS | Status: AC | PRN
  Administered 2020-03-15: 500 [IU]
  Filled 2020-03-15: qty 5

## 2020-03-15 MED ORDER — DIPHENHYDRAMINE HCL 50 MG/ML IJ SOLN
50.0000 mg | Freq: Once | INTRAMUSCULAR | Status: AC
  Administered 2020-03-15: 50 mg via INTRAVENOUS
  Filled 2020-03-15: qty 1

## 2020-03-15 MED ORDER — SODIUM CHLORIDE 0.9 % IV SOLN
Freq: Once | INTRAVENOUS | Status: AC
  Filled 2020-03-15: qty 250

## 2020-03-15 NOTE — Progress Notes (Signed)
Patient received prescribed treatment in clinic. Ruxience started at 100 mg/hour and titrated an additional 100 mg/hour every 30 minutes until reached the maximum of 400 mg/hour dosing.Tolerated well. Patient stable at discharge

## 2020-03-22 ENCOUNTER — Inpatient Hospital Stay: Payer: BC Managed Care – PPO

## 2020-03-22 ENCOUNTER — Inpatient Hospital Stay (HOSPITAL_BASED_OUTPATIENT_CLINIC_OR_DEPARTMENT_OTHER): Payer: BC Managed Care – PPO | Admitting: Oncology

## 2020-03-22 ENCOUNTER — Other Ambulatory Visit: Payer: Self-pay

## 2020-03-22 VITALS — BP 119/74 | HR 53 | Temp 96.0°F | Resp 18 | Wt 360.0 lb

## 2020-03-22 DIAGNOSIS — D693 Immune thrombocytopenic purpura: Secondary | ICD-10-CM | POA: Diagnosis not present

## 2020-03-22 DIAGNOSIS — D696 Thrombocytopenia, unspecified: Secondary | ICD-10-CM

## 2020-03-22 DIAGNOSIS — Z5112 Encounter for antineoplastic immunotherapy: Secondary | ICD-10-CM | POA: Diagnosis not present

## 2020-03-22 LAB — CBC WITH DIFFERENTIAL/PLATELET
Abs Immature Granulocytes: 0.02 10*3/uL (ref 0.00–0.07)
Basophils Absolute: 0 10*3/uL (ref 0.0–0.1)
Basophils Relative: 0 %
Eosinophils Absolute: 0.1 10*3/uL (ref 0.0–0.5)
Eosinophils Relative: 1 %
HCT: 39.7 % (ref 39.0–52.0)
Hemoglobin: 13.1 g/dL (ref 13.0–17.0)
Immature Granulocytes: 0 %
Lymphocytes Relative: 18 %
Lymphs Abs: 1.4 10*3/uL (ref 0.7–4.0)
MCH: 25.3 pg — ABNORMAL LOW (ref 26.0–34.0)
MCHC: 33 g/dL (ref 30.0–36.0)
MCV: 76.8 fL — ABNORMAL LOW (ref 80.0–100.0)
Monocytes Absolute: 0.3 10*3/uL (ref 0.1–1.0)
Monocytes Relative: 5 %
Neutro Abs: 5.7 10*3/uL (ref 1.7–7.7)
Neutrophils Relative %: 76 %
Platelets: 25 10*3/uL — CL (ref 150–400)
RBC: 5.17 MIL/uL (ref 4.22–5.81)
RDW: 14.2 % (ref 11.5–15.5)
Smear Review: NORMAL
WBC: 7.5 10*3/uL (ref 4.0–10.5)
nRBC: 0 % (ref 0.0–0.2)

## 2020-03-22 MED ORDER — FAMOTIDINE IN NACL 20-0.9 MG/50ML-% IV SOLN
20.0000 mg | Freq: Two times a day (BID) | INTRAVENOUS | Status: DC
  Administered 2020-03-22: 20 mg via INTRAVENOUS
  Filled 2020-03-22: qty 50

## 2020-03-22 MED ORDER — HEPARIN SOD (PORK) LOCK FLUSH 100 UNIT/ML IV SOLN
INTRAVENOUS | Status: AC
  Filled 2020-03-22: qty 5

## 2020-03-22 MED ORDER — SODIUM CHLORIDE 0.9 % IV SOLN
375.0000 mg/m2 | Freq: Once | INTRAVENOUS | Status: AC
  Administered 2020-03-22: 1000 mg via INTRAVENOUS
  Filled 2020-03-22: qty 100

## 2020-03-22 MED ORDER — SODIUM CHLORIDE 0.9 % IV SOLN: 375.0000 mg/m2 | Freq: Once | INTRAVENOUS | Status: DC

## 2020-03-22 MED ORDER — SODIUM CHLORIDE 0.9 % IV SOLN
Freq: Once | INTRAVENOUS | Status: AC
  Filled 2020-03-22: qty 250

## 2020-03-22 MED ORDER — HEPARIN SOD (PORK) LOCK FLUSH 100 UNIT/ML IV SOLN
500.0000 [IU] | Freq: Once | INTRAVENOUS | Status: AC | PRN
Start: 2020-03-22 — End: 2020-03-22
  Administered 2020-03-22: 500 [IU]
  Filled 2020-03-22: qty 5

## 2020-03-22 MED ORDER — SODIUM CHLORIDE 0.9 % IV SOLN
20.0000 mg | Freq: Once | INTRAVENOUS | Status: AC
  Administered 2020-03-22: 20 mg via INTRAVENOUS
  Filled 2020-03-22: qty 20

## 2020-03-22 MED ORDER — DIPHENHYDRAMINE HCL 50 MG/ML IJ SOLN
50.0000 mg | Freq: Once | INTRAMUSCULAR | Status: AC
  Administered 2020-03-22: 50 mg via INTRAVENOUS
  Filled 2020-03-22: qty 1

## 2020-03-22 MED ORDER — ACETAMINOPHEN 325 MG PO TABS
650.0000 mg | ORAL_TABLET | Freq: Once | ORAL | Status: AC
  Administered 2020-03-22: 650 mg via ORAL
  Filled 2020-03-22: qty 2

## 2020-03-22 NOTE — Progress Notes (Signed)
[place]  [phone] Fax:(336) [phone]  ID: Samuel Rojas OB: 16-Oct-1990  MR#: [id number]  EXB#:[id number]  Patient Care Team: Center, [facility] as PCP - General (General Practice) Lloyd Huger, MD as Consulting Physician (Hematology and Oncology)  CHIEF COMPLAINT: ITP and rhabdomyelosis  INTERVAL HISTORY: Patient is a 30 year old male with history of ITP and rhabdomyolysis who returns to clinic for further evaluation and consideration of cycle 4 rituximab. Last infusion was on 03/15/20.  Overall he feels well.  Has occasional exertional shortness of breath which resolves with rest.  He never started his prednisone as ordered by Dr. Grayland Ormond. Denies recurrent gum bleeding. Had mild rate based reaction on third bump with his first cycle with symptoms of a scratchy throat which resolved with extra steroids. Unfortunately he was unable to sleep for several days after infusion.  Unaware of bruising.  No neurologic complaints.  No recent fevers or illness.  No chest pain, shortness of breath, cough, hemoptysis.  No nausea, vomiting, constipation, or diarrhea.  No urinary complaints.  No other specific complaints today.  REVIEW OF SYSTEMS:   Review of Systems  Constitutional: Negative.  Negative for fever, malaise/fatigue and weight loss.  HENT: Negative.  Negative for nosebleeds.   Respiratory: Positive for shortness of breath. Negative for cough and hemoptysis.   Cardiovascular: Negative.  Negative for chest pain and leg swelling.  Gastrointestinal: Negative.  Negative for abdominal pain, blood in stool and melena.  Genitourinary: Negative.  Negative for hematuria.  Musculoskeletal: Negative.  Negative for back pain and myalgias.  Skin: Negative.  Negative for rash.  Neurological: Negative.  Negative for sensory change, focal weakness and weakness.  Endo/Heme/Allergies: Bruises/bleeds easily.  Psychiatric/Behavioral:  Negative.  The patient is not nervous/anxious.   As per HPI. Otherwise, a complete review of systems is negative.  PAST MEDICAL HISTORY: Past Medical History:  Diagnosis Date  . Cellulitis    left leg  . Idiopathic thrombocytopenic purpura (ITP) (HCC)     PAST SURGICAL HISTORY: Past Surgical History:  Procedure Laterality Date  . PORTA CATH INSERTION N/A 09/20/2017   Procedure: PORTA CATH INSERTION;  Surgeon: Algernon Huxley, MD;  Location: [place] [facility];  Service: Cardiovascular;  Laterality: N/A;  . TONSILLECTOMY      FAMILY HISTORY: Family History  Problem Relation Age of Onset  . Hypertension Mother   . Diabetes Mother      ADVANCED DIRECTIVES:    HEALTH MAINTENANCE: Social History   Tobacco Use  . Smoking status: Never Smoker  . Smokeless tobacco: Never Used  Vaping Use  . Vaping Use: Never used  Substance Use Topics  . Alcohol use: No  . Drug use: No     Colonoscopy:  Lipid panel:  Allergies  Allergen Reactions  . Augmentin [Amoxicillin-Pot Clavulanate] Hives  . Sulfamethoxazole-Trimethoprim     Dehydration, confusion , high BP    Current Outpatient Medications  Medication Sig Dispense Refill  . predniSONE (DELTASONE) 50 MG tablet Take 2 tablets (100 mg) daily for 5 days. (Patient not taking: Reported on 03/14/2020) 10 tablet 0   No current facility-administered medications for this visit.   Facility-Administered Medications Ordered in Other Visits  Medication Dose Route Frequency Provider Last Rate Last Admin  . famotidine (PEPCID) IVPB 20 mg premix  20 mg Intravenous Q12H Lloyd Huger, MD   Stopped at 10/01/17 1001  . sodium chloride flush (NS) 0.9 % injection 10 mL  10 mL Intracatheter PRN  Lloyd Huger, MD   10 mL at 10/01/17 0855  . sodium chloride flush (NS) 0.9 % injection 10 mL  10 mL Intravenous PRN Lloyd Huger, MD   10 mL at 02/27/20 1450   OBJECTIVE: There were no vitals filed for this visit.   There is no height  or weight on file to calculate BMI.    ECOG FS:1 - Symptomatic but completely ambulatory  Physical Exam Constitutional:      Appearance: Normal appearance. He is obese.  HENT:     Head: Normocephalic and atraumatic.  Eyes:     Pupils: Pupils are equal, round, and reactive to light.  Cardiovascular:     Rate and Rhythm: Normal rate and regular rhythm.     Heart sounds: Normal heart sounds. No murmur heard.   Pulmonary:     Effort: Pulmonary effort is normal.     Breath sounds: Normal breath sounds. No wheezing.  Abdominal:     General: Bowel sounds are normal. There is no distension.     Palpations: Abdomen is soft.     Tenderness: There is no abdominal tenderness.  Musculoskeletal:        General: Normal range of motion.     Cervical back: Normal range of motion.  Skin:    General: Skin is warm and dry.     Findings: No rash.  Neurological:     Mental Status: He is alert and oriented to person, place, and time.  Psychiatric:        Judgment: Judgment normal.      LAB RESULTS:  Lab Results  Component Value Date   NA 139 08/26/2019   K 3.4 (L) 08/26/2019   CL 108 08/26/2019   CO2 26 08/26/2019   GLUCOSE 135 (H) 08/26/2019   BUN 11 08/26/2019   CREATININE 0.79 08/26/2019   CALCIUM 8.1 (L) 08/26/2019   PROT 7.9 08/25/2019   ALBUMIN 4.4 08/25/2019   AST 70 (H) 08/25/2019   ALT 37 08/25/2019   ALKPHOS 72 08/25/2019   BILITOT 0.8 08/25/2019   GFRNONAA >60 08/26/2019   GFRAA >60 08/26/2019    Lab Results  Component Value Date   WBC 7.0 03/14/2020   NEUTROABS 5.3 03/14/2020   HGB 14.2 03/14/2020   HCT 43.6 03/14/2020   MCV 76.6 (L) 03/14/2020   PLT 22 (LL) 03/14/2020   Lab Results  Component Value Date   IRON 50 09/09/2015   TIBC 320 09/09/2015   IRONPCTSAT 16 (L) 09/09/2015   Lab Results  Component Value Date   FERRITIN 75 09/09/2015    STUDIES: No results found.  ASSESSMENT: ITP, rhabdomyolysis.  PLAN:     1.  ITP:  -Secondary to  ITP -Initially discovered in May 2017 when he presented with a platelet count of 42,000. -Abdominal ultrasound from 09/16/2015 did not reveal any splenomegaly. -Unable to complete bone marrow biopsy secondary to body habitus -Received 5 days of 100 mg prednisone on 10/07/15 with excellent response of platelet count > 200. -He was lost to follow-up and presented back to [facility] ED on 07/01/2017 for nosebleed and found to have a platelet count of 15,000.  He was given additional steroids and sent home.  He was referred back to hematology. -Labs from 08/30/2017 show platelet count of 24,000. -He was started on weekly Rituxan x4 on 09/17/17-10/08/17 with improvement of platelet count to >100.  -He has received Rituxan intermittently for the past several years as his platelet count declines. -He was  given Rituxan on 08/19/2018-09/09/18 for plt count of 13,000 and again on 08/25/2019-09/08/2019 for platelet count of 26,000.  -Most recently he presented back with a platelet count of 11,000 on 02/27/2020. -He will receive 4 doses of IV Rituxan first given last week on 03/01/2020. -Had rate based reaction with cycle 1 with symptoms of a scratchy throat.  He was given additional dose of steroids.  Unfortunately, this made him unable to sleep for several days post infusion.  We will try to avoid this in the future.  -Today his platelet count is 25,000. -Proceed with Rituxan today.  He received reduced rate Rituxan on cycle 2 and 3.  He will receive rapid infusion today. -RTC in 2 weeks with repeat lab work only and in 4 weeks with repeat labs and virtual visit.  Patient may need short course steroids given his platelet counts have not rebounded.   2.  Poor venous access: -Patient has port.  -Continue port flushes every 6 to 8 weeks.  3.  Nontraumatic rhabdomyolysis: -Resolved.   -Patient has had 2 hospital admissions for rhabdomyolysis.  -CK limits have been within normal limits.  -Last was 76 on 09/22/19. -Continue to  monitor  Disposition: -RTC in 2 week for repeat labs only (CBC).  -RTC in 4 weeks for repeat labs (CBC) and virtual visit.  Greater than 50% was spent in counseling and coordination of care with this patient including but not limited to discussion of the relevant topics above (See A&P) including, but not limited to diagnosis and management of acute and chronic medical conditions.   Greater than 50% was spent in counseling and coordination of care with this patient including but not limited to discussion of the relevant topics above (See A&P) including, but not limited to diagnosis and management of acute and chronic medical conditions.   Jacquelin Hawking, NP   03/22/2020 8:51 AM

## 2020-03-22 NOTE — Progress Notes (Signed)
Pt tolerated last Ruxience infusion well with no complications. Per Durenda Hurt NP okay to run Ruxience as a rapid infusion today.   1300: Pt tolerated infusion well. No s/s of distress noted. Pt stable at discharge.

## 2020-03-22 NOTE — Progress Notes (Signed)
NP wants to run todays infusion as rapid.

## 2020-03-28 ENCOUNTER — Other Ambulatory Visit: Payer: BC Managed Care – PPO

## 2020-03-29 ENCOUNTER — Telehealth: Payer: BC Managed Care – PPO | Admitting: Oncology

## 2020-04-05 ENCOUNTER — Inpatient Hospital Stay: Payer: BC Managed Care – PPO

## 2020-04-05 DIAGNOSIS — D693 Immune thrombocytopenic purpura: Secondary | ICD-10-CM

## 2020-04-05 DIAGNOSIS — Z5112 Encounter for antineoplastic immunotherapy: Secondary | ICD-10-CM | POA: Diagnosis not present

## 2020-04-05 LAB — CBC WITH DIFFERENTIAL/PLATELET
Abs Immature Granulocytes: 0.01 10*3/uL (ref 0.00–0.07)
Basophils Absolute: 0 10*3/uL (ref 0.0–0.1)
Basophils Relative: 0 %
Eosinophils Absolute: 0.2 10*3/uL (ref 0.0–0.5)
Eosinophils Relative: 2 %
HCT: 43.2 % (ref 39.0–52.0)
Hemoglobin: 14.1 g/dL (ref 13.0–17.0)
Immature Granulocytes: 0 %
Lymphocytes Relative: 18 %
Lymphs Abs: 1.6 10*3/uL (ref 0.7–4.0)
MCH: 25.4 pg — ABNORMAL LOW (ref 26.0–34.0)
MCHC: 32.6 g/dL (ref 30.0–36.0)
MCV: 77.8 fL — ABNORMAL LOW (ref 80.0–100.0)
Monocytes Absolute: 0.4 10*3/uL (ref 0.1–1.0)
Monocytes Relative: 5 %
Neutro Abs: 6.6 10*3/uL (ref 1.7–7.7)
Neutrophils Relative %: 75 %
Platelets: 109 10*3/uL — ABNORMAL LOW (ref 150–400)
RBC: 5.55 MIL/uL (ref 4.22–5.81)
RDW: 14.4 % (ref 11.5–15.5)
WBC: 8.8 10*3/uL (ref 4.0–10.5)
nRBC: 0 % (ref 0.0–0.2)

## 2020-04-19 ENCOUNTER — Encounter: Payer: Self-pay | Admitting: Oncology

## 2020-04-19 ENCOUNTER — Inpatient Hospital Stay (HOSPITAL_BASED_OUTPATIENT_CLINIC_OR_DEPARTMENT_OTHER): Payer: BC Managed Care – PPO | Admitting: Oncology

## 2020-04-19 ENCOUNTER — Inpatient Hospital Stay: Payer: BC Managed Care – PPO | Attending: Oncology

## 2020-04-19 DIAGNOSIS — D693 Immune thrombocytopenic purpura: Secondary | ICD-10-CM

## 2020-04-19 NOTE — Progress Notes (Signed)
[place]  Telephone:(336) [phone] Fax:(336) [phone]  ID: PAVEL GADD OB: September 08, 1990  MR#: [id number]  [facility]#:[id number]  Patient Care Team: Center, [facility] as PCP - General (General Practice) Lloyd Huger, MD as Consulting Physician (Hematology and Oncology)  CHIEF COMPLAINT: ITP and rhabdomyelosis  I connected with Sammy D Rothschild on 04/19/20 at  1:30 PM EST by video enabled telemedicine visit and verified that I am speaking with the correct person using two identifiers.   I discussed the limitations, risks, security and privacy concerns of performing an evaluation and management service by telemedicine and the availability of in-person appointments. I also discussed with the patient that there may be a patient responsible charge related to this service. The patient expressed understanding and agreed to proceed.   Other persons participating in the visit and their role in the encounter: None  Patient's location: [facility] Provider's location: Clinic    INTERVAL HISTORY: Patient is a 30 year old male with history of ITP and rhabdomyolysis who returns to clinic fo followup after 4 cycles of rituximab.   His last infusion was on 03/22/20.  In the interim he has done well.  He denies any bleeding.  Denies any additional shortness of breath. Denies recurrent gum bleeding. Had mild rate based reaction on third bump with his first cycle with symptoms of a scratchy throat which resolved with extra steroids. Unfortunately he was unable to sleep for several days after infusion.  Unaware of bruising.  No neurologic complaints.  No recent fevers or illness.  No chest pain, shortness of breath, cough, hemoptysis.  No nausea, vomiting, constipation, or diarrhea.  No urinary complaints.  No other specific complaints today.  REVIEW OF SYSTEMS:   Review of Systems  Constitutional: Negative.  Negative for fever, malaise/fatigue and weight  loss.  HENT: Negative.  Negative for nosebleeds.   Respiratory: Negative for cough, hemoptysis and shortness of breath.   Cardiovascular: Negative.  Negative for chest pain and leg swelling.  Gastrointestinal: Negative.  Negative for abdominal pain, blood in stool and melena.  Genitourinary: Negative.  Negative for hematuria.  Musculoskeletal: Negative.  Negative for back pain and myalgias.  Skin: Negative.  Negative for rash.  Neurological: Negative.  Negative for sensory change, focal weakness and weakness.  Endo/Heme/Allergies: Bruises/bleeds easily.  Psychiatric/Behavioral: Negative.  The patient is not nervous/anxious.   As per HPI. Otherwise, a complete review of systems is negative.  PAST MEDICAL HISTORY: Past Medical History:  Diagnosis Date  . Cellulitis    left leg  . Idiopathic thrombocytopenic purpura (ITP) (HCC)     PAST SURGICAL HISTORY: Past Surgical History:  Procedure Laterality Date  . PORTA CATH INSERTION N/A 09/20/2017   Procedure: PORTA CATH INSERTION;  Surgeon: Algernon Huxley, MD;  Location: [place] [facility];  Service: Cardiovascular;  Laterality: N/A;  . TONSILLECTOMY      FAMILY HISTORY: Family History  Problem Relation Age of Onset  . Hypertension Mother   . Diabetes Mother      ADVANCED DIRECTIVES:    HEALTH MAINTENANCE: Social History   Tobacco Use  . Smoking status: Never Smoker  . Smokeless tobacco: Never Used  Vaping Use  . Vaping Use: Never used  Substance Use Topics  . Alcohol use: No  . Drug use: No     Colonoscopy:  Lipid panel:  Allergies  Allergen Reactions  . Augmentin [Amoxicillin-Pot Clavulanate] Hives  . Sulfamethoxazole-Trimethoprim     Dehydration, confusion , high BP  Current Outpatient Medications  Medication Sig Dispense Refill  . predniSONE (DELTASONE) 50 MG tablet Take 2 tablets (100 mg) daily for 5 days. (Patient not taking: No sig reported) 10 tablet 0   No current facility-administered medications  for this visit.   Facility-Administered Medications Ordered in Other Visits  Medication Dose Route Frequency Provider Last Rate Last Admin  . famotidine (PEPCID) IVPB 20 mg premix  20 mg Intravenous Q12H Lloyd Huger, MD   Stopped at 10/01/17 1001  . sodium chloride flush (NS) 0.9 % injection 10 mL  10 mL Intracatheter PRN Lloyd Huger, MD   10 mL at 10/01/17 0855  . sodium chloride flush (NS) 0.9 % injection 10 mL  10 mL Intravenous PRN Lloyd Huger, MD   10 mL at 02/27/20 1450   OBJECTIVE: There were no vitals filed for this visit.   There is no height or weight on file to calculate BMI.    ECOG FS:1 - Symptomatic but completely ambulatory  Physical Exam Constitutional:      Appearance: Normal appearance. He is obese.  Neurological:     Mental Status: He is alert and oriented to person, place, and time.      LAB RESULTS:  Lab Results  Component Value Date   NA 139 08/26/2019   K 3.4 (L) 08/26/2019   CL 108 08/26/2019   CO2 26 08/26/2019   GLUCOSE 135 (H) 08/26/2019   BUN 11 08/26/2019   CREATININE 0.79 08/26/2019   CALCIUM 8.1 (L) 08/26/2019   PROT 7.9 08/25/2019   ALBUMIN 4.4 08/25/2019   AST 70 (H) 08/25/2019   ALT 37 08/25/2019   ALKPHOS 72 08/25/2019   BILITOT 0.8 08/25/2019   GFRNONAA >60 08/26/2019   GFRAA >60 08/26/2019    Lab Results  Component Value Date   WBC 8.8 04/05/2020   NEUTROABS 6.6 04/05/2020   HGB 14.1 04/05/2020   HCT 43.2 04/05/2020   MCV 77.8 (L) 04/05/2020   PLT 109 (L) 04/05/2020   Lab Results  Component Value Date   IRON 50 09/09/2015   TIBC 320 09/09/2015   IRONPCTSAT 16 (L) 09/09/2015   Lab Results  Component Value Date   FERRITIN 75 09/09/2015    STUDIES: No results found.  ASSESSMENT: ITP, rhabdomyolysis.  PLAN:     1.  ITP:  -Secondary to ITP -Initially discovered in May 2017 when he presented with a platelet count of 42,000. -Abdominal ultrasound from 09/16/2015 did not reveal any  splenomegaly. -Unable to complete bone marrow biopsy secondary to body habitus -Received 5 days of 100 mg prednisone on 10/07/15 with excellent response of platelet count > 200. -He was lost to follow-up and presented back to [facility] ED on 07/01/2017 for nosebleed and found to have a platelet count of 15,000.  He was given additional steroids and sent home.  He was referred back to hematology. -Labs from 08/30/2017 show platelet count of 24,000. -He was started on weekly Rituxan x4 on 09/17/17-10/08/17 with improvement of platelet count to >100.  -He has received Rituxan intermittently for the past several years as his platelet count declines. -He was given Rituxan on 08/19/2018-09/09/18 for plt count of 13,000 and again on 08/25/2019-09/08/2019 for platelet count of 26,000.  -Most recently he presented back with a platelet count of 11,000 on 02/27/2020. -He received 4 doses of Rituxan completing on 03/22/2020 -Had rate based reaction with cycle 1 with symptoms of a scratchy throat.  He was given additional dose of steroids.  Unfortunately, this made him unable to sleep for several days post infusion.  We will try to avoid this in the future.  -Labs from 04/05/2020 show platelet count of 109,000 -RTC monthly for lab work (CBC) and in 6 months with labs and assessment.  2.  Poor venous access: -Patient has port.  -Continue port flushes every 6 to 8 weeks.  3.  Nontraumatic rhabdomyolysis: -Resolved.   -Patient has had 2 hospital admissions for rhabdomyolysis.  -CK limits have been within normal limits.  -Last was 76 on 09/22/19. -Continue to monitor  Disposition: -RTC monthly for labs only (CBC). -RTC in 6 months with repeat labs and MD assessment.  I provided 10 minutes of face-to-face video visit time during this encounter, and > 50% was spent counseling as documented under my assessment & plan.  Jacquelin Hawking, NP   04/19/2020 1:44 PM

## 2020-04-19 NOTE — Progress Notes (Signed)
Patient states no concerns at the moment. 

## 2020-05-17 ENCOUNTER — Inpatient Hospital Stay: Payer: BC Managed Care – PPO | Attending: Oncology

## 2020-05-17 ENCOUNTER — Other Ambulatory Visit: Payer: Self-pay

## 2020-05-17 DIAGNOSIS — D693 Immune thrombocytopenic purpura: Secondary | ICD-10-CM | POA: Diagnosis not present

## 2020-05-17 DIAGNOSIS — Z452 Encounter for adjustment and management of vascular access device: Secondary | ICD-10-CM | POA: Insufficient documentation

## 2020-05-17 DIAGNOSIS — Z95828 Presence of other vascular implants and grafts: Secondary | ICD-10-CM

## 2020-05-17 LAB — CBC WITH DIFFERENTIAL/PLATELET
Abs Immature Granulocytes: 0.01 10*3/uL (ref 0.00–0.07)
Basophils Absolute: 0 10*3/uL (ref 0.0–0.1)
Basophils Relative: 0 %
Eosinophils Absolute: 0.1 10*3/uL (ref 0.0–0.5)
Eosinophils Relative: 2 %
HCT: 41.4 % (ref 39.0–52.0)
Hemoglobin: 13.4 g/dL (ref 13.0–17.0)
Immature Granulocytes: 0 %
Lymphocytes Relative: 23 %
Lymphs Abs: 1.6 10*3/uL (ref 0.7–4.0)
MCH: 25.4 pg — ABNORMAL LOW (ref 26.0–34.0)
MCHC: 32.4 g/dL (ref 30.0–36.0)
MCV: 78.4 fL — ABNORMAL LOW (ref 80.0–100.0)
Monocytes Absolute: 0.3 10*3/uL (ref 0.1–1.0)
Monocytes Relative: 5 %
Neutro Abs: 4.9 10*3/uL (ref 1.7–7.7)
Neutrophils Relative %: 70 %
Platelets: 66 10*3/uL — ABNORMAL LOW (ref 150–400)
RBC: 5.28 MIL/uL (ref 4.22–5.81)
RDW: 14 % (ref 11.5–15.5)
WBC: 7 10*3/uL (ref 4.0–10.5)
nRBC: 0 % (ref 0.0–0.2)

## 2020-05-17 MED ORDER — HEPARIN SOD (PORK) LOCK FLUSH 100 UNIT/ML IV SOLN
500.0000 [IU] | Freq: Once | INTRAVENOUS | Status: AC
  Administered 2020-05-17: 500 [IU] via INTRAVENOUS
  Filled 2020-05-17: qty 5

## 2020-05-17 MED ORDER — SODIUM CHLORIDE 0.9% FLUSH
10.0000 mL | Freq: Once | INTRAVENOUS | Status: AC
  Administered 2020-05-17: 10 mL via INTRAVENOUS
  Filled 2020-05-17: qty 10

## 2020-05-17 MED ORDER — HEPARIN SOD (PORK) LOCK FLUSH 100 UNIT/ML IV SOLN
INTRAVENOUS | Status: AC
  Filled 2020-05-17: qty 5

## 2020-05-17 NOTE — Research (Signed)
hep

## 2020-06-21 ENCOUNTER — Inpatient Hospital Stay: Payer: BC Managed Care – PPO | Attending: Oncology

## 2020-07-19 ENCOUNTER — Other Ambulatory Visit: Payer: Self-pay

## 2020-07-19 ENCOUNTER — Inpatient Hospital Stay: Payer: BC Managed Care – PPO | Attending: Oncology

## 2020-07-19 DIAGNOSIS — Z452 Encounter for adjustment and management of vascular access device: Secondary | ICD-10-CM | POA: Insufficient documentation

## 2020-07-19 DIAGNOSIS — D693 Immune thrombocytopenic purpura: Secondary | ICD-10-CM | POA: Insufficient documentation

## 2020-07-19 LAB — CBC WITH DIFFERENTIAL/PLATELET
Abs Immature Granulocytes: 0.02 10*3/uL (ref 0.00–0.07)
Basophils Absolute: 0 10*3/uL (ref 0.0–0.1)
Basophils Relative: 0 %
Eosinophils Absolute: 0.2 10*3/uL (ref 0.0–0.5)
Eosinophils Relative: 2 %
HCT: 39.8 % (ref 39.0–52.0)
Hemoglobin: 12.9 g/dL — ABNORMAL LOW (ref 13.0–17.0)
Immature Granulocytes: 0 %
Lymphocytes Relative: 16 %
Lymphs Abs: 1.2 10*3/uL (ref 0.7–4.0)
MCH: 25.7 pg — ABNORMAL LOW (ref 26.0–34.0)
MCHC: 32.4 g/dL (ref 30.0–36.0)
MCV: 79.3 fL — ABNORMAL LOW (ref 80.0–100.0)
Monocytes Absolute: 0.4 10*3/uL (ref 0.1–1.0)
Monocytes Relative: 5 %
Neutro Abs: 5.6 10*3/uL (ref 1.7–7.7)
Neutrophils Relative %: 77 %
Platelets: 81 10*3/uL — ABNORMAL LOW (ref 150–400)
RBC: 5.02 MIL/uL (ref 4.22–5.81)
RDW: 13.7 % (ref 11.5–15.5)
WBC: 7.4 10*3/uL (ref 4.0–10.5)
nRBC: 0 % (ref 0.0–0.2)

## 2020-07-19 MED ORDER — HEPARIN SOD (PORK) LOCK FLUSH 100 UNIT/ML IV SOLN
500.0000 [IU] | Freq: Once | INTRAVENOUS | Status: AC
  Administered 2020-07-19: 500 [IU] via INTRAVENOUS
  Filled 2020-07-19: qty 5

## 2020-07-19 MED ORDER — HEPARIN SOD (PORK) LOCK FLUSH 100 UNIT/ML IV SOLN
INTRAVENOUS | Status: AC
  Filled 2020-07-19: qty 5

## 2020-07-19 MED ORDER — SODIUM CHLORIDE 0.9% FLUSH
10.0000 mL | Freq: Once | INTRAVENOUS | Status: AC
  Administered 2020-07-19: 10 mL via INTRAVENOUS
  Filled 2020-07-19: qty 10

## 2020-07-30 ENCOUNTER — Other Ambulatory Visit: Payer: Self-pay

## 2020-07-30 ENCOUNTER — Emergency Department: Payer: BC Managed Care – PPO

## 2020-07-30 ENCOUNTER — Emergency Department
Admission: EM | Admit: 2020-07-30 | Discharge: 2020-07-31 | Disposition: A | Discharge status: Home / Self Care | Payer: BC Managed Care – PPO | Attending: Emergency Medicine | Admitting: Emergency Medicine

## 2020-07-30 DIAGNOSIS — R519 Headache, unspecified: Secondary | ICD-10-CM | POA: Diagnosis not present

## 2020-07-30 DIAGNOSIS — R569 Unspecified convulsions: Secondary | ICD-10-CM | POA: Insufficient documentation

## 2020-07-30 MED ORDER — ACETAMINOPHEN 325 MG PO TABS
650.0000 mg | ORAL_TABLET | Freq: Once | ORAL | Status: AC
  Administered 2020-07-31: 650 mg via ORAL
  Filled 2020-07-30: qty 2

## 2020-07-30 NOTE — ED Provider Notes (Signed)
[facility] Emergency Department Provider Note ____________________________________________   Event Date/Time   First MD Initiated Contact with Patient 07/30/20 2312     (approximate)  I have reviewed the triage vital signs and the nursing notes.   HISTORY  Chief Complaint Seizures    HPI Samuel Rojas is a 30 y.o. male with PMH as noted below including chronic ITP who presents with an apparent seizure.  The patient states that he was lying in bed watching TV, dozed off, felt like he heard someone talking to him, and then woke up with EMS and family members around him.  Per EMS the patient's family witnessed generalized seizure activity.  The patient denies any prior history of seizure.  Currently, he states he has a headache and feels like he does not have full control of his extremities or like they are rubbery.  He denies other acute symptoms.  He states he was in his usual state of health until this evening.   Past Medical History:  Diagnosis Date   Cellulitis    left leg   Idiopathic thrombocytopenic purpura (ITP) (HCC)     Patient Active Problem List   Diagnosis Date Noted   Rhabdomyolysis 08/25/2019   Chronic ITP (idiopathic thrombocytopenia) (HCC) 08/26/2018   Goals of care, counseling/discussion 08/13/2018   Extreme obesity 11/25/2017   Acanthosis nigricans 11/13/2016   Epistaxis 11/13/2016   Lymphedema of both lower extremities 11/13/2016   Thrombocytopenia (HCC) 09/09/2015   Cellulitis 01/16/2014    Past Surgical History:  Procedure Laterality Date   PORTA CATH INSERTION N/A 09/20/2017   Procedure: PORTA CATH INSERTION;  Surgeon: Annice Needy, MD;  Location: [facility] INVASIVE [facility];  Service: Cardiovascular;  Laterality: N/A;   TONSILLECTOMY      Prior to Admission medications   Medication Sig Start Date End Date Taking? Authorizing Provider  predniSONE (DELTASONE) 50 MG tablet Take 2 tablets (100 mg) daily for 5  days. Patient not taking: No sig reported 02/27/20   Jeralyn Ruths, MD    Allergies Augmentin [amoxicillin-pot clavulanate] and Sulfamethoxazole-trimethoprim  Family History  Problem Relation Age of Onset   Hypertension Mother    Diabetes Mother     Social History Social History   Tobacco Use   Smoking status: Never   Smokeless tobacco: Never  Vaping Use   Vaping Use: Never used  Substance Use Topics   Alcohol use: No   Drug use: No    Review of Systems  Constitutional: No fever/chills Eyes: No visual changes. ENT: No sore throat. Cardiovascular: Denies chest pain. Respiratory: Denies shortness of breath. Gastrointestinal: No moaning or diarrhea.  Genitourinary: Negative for dysuria.  Musculoskeletal: Negative for back pain. Skin: Negative for rash. Neurological: Positive for headaches, negative for focal weakness or numbness.   ____________________________________________   PHYSICAL EXAM:  VITAL SIGNS: ED Triage Vitals  Enc Vitals Group     BP 07/30/20 2235 134/90     Pulse Rate 07/30/20 2235 (!) 112     Resp 07/30/20 2235 (!) 21     Temp 07/30/20 2235 98.6 F (37 C)     Temp Source 07/30/20 2235 Oral     SpO2 07/30/20 2235 98 %     Weight 07/30/20 2236 (!) 370 lb (167.8 kg)     Height 07/30/20 2236 5\' 6"  (1.676 m)     Head Circumference --      Peak Flow --      Pain Score 07/30/20 2236 6  Pain Loc --      Pain Edu? --      Excl. in GC? --     Constitutional: Alert and oriented. Well appearing and in no acute distress. Eyes: Conjunctivae are normal.  EOMI.  PERRLA. Head: Atraumatic. Nose: No congestion/rhinnorhea. Mouth/Throat: Mucous membranes are moist.   Neck: Normal range of motion.  Cardiovascular: Normal rate, regular rhythm. Good peripheral circulation. Respiratory: Normal respiratory effort.  No retractions.  Gastrointestinal: No distention.  Musculoskeletal: No lower extremity edema.  Extremities warm and well perfused.   Neurologic:  Normal speech and language.  Motor and sensory intact in all extremities.  No facial droop.  No pronator drift.  Normal coordination with no ataxia on finger-to-nose.   Skin:  Skin is warm and dry. No rash noted. Psychiatric: Mood and affect are normal. Speech and behavior are normal.  ____________________________________________   LABS (all labs ordered are listed, but only abnormal results are displayed)  Labs Reviewed  COMPREHENSIVE METABOLIC PANEL - Abnormal; Notable for the following components:      Result Value   Potassium 3.2 (*)    Glucose, Bld 119 (*)    Calcium 8.8 (*)    All other components within normal limits  CBC WITH DIFFERENTIAL/PLATELET - Abnormal; Notable for the following components:   MCV 78.1 (*)    MCH 25.5 (*)    Platelets 85 (*)    All other components within normal limits   ____________________________________________  EKG  ED ECG REPORT I, Dionne Bucy, the attending physician, personally viewed and interpreted this ECG.  Date: 07/30/2020 EKG Time: 0105 Rate: 74 Rhythm: normal sinus rhythm QRS Axis: normal Intervals: normal ST/T Wave abnormalities: normal Narrative Interpretation: no evidence of acute ischemia  ____________________________________________  RADIOLOGY  CT head: No ICH or other acute abnormality  ____________________________________________   PROCEDURES  Procedure(s) performed: No  Procedures  Critical Care performed: No ____________________________________________   INITIAL IMPRESSION / ASSESSMENT AND PLAN / ED COURSE  Pertinent labs & imaging results that were available during my care of the patient were reviewed by me and considered in my medical decision making (see chart for details).   30 year old male with PMH as noted above including chronic ITP presents with an apparent generalized seizure.  The patient has no prior seizure history.  On exam, he is overall well-appearing.  His vital  signs are normal except that he was slightly tachycardic at arrival.  This is now resolved.  Neurologic exam is normal, and the remainder of the physical exam is unremarkable.  The patient reports his extremities feeling rubbery and like he cannot control them although the sensation is relatively mild, and he has normal motor function and no ataxia on exam.  Presentation is consistent with new onset seizure.  The patient was already lying down and dozing off so I doubt a syncopal episode.  We will obtain a CT, basic labs, and reassess.  ----------------------------------------- 1:41 AM on 07/31/2020 -----------------------------------------  CT head is negative.  Lab work-up shows no acute abnormalities.  The patient has thrombocytopenia but this is unchanged from prior.  His vital signs are normal.  On reassessment, the patient states he feels fine.  He appears comfortable.  He has had no recurrent seizure-like activity in the ED.  He is stable for discharge at this time.  I counseled him on the results of the work-up and have given him urology follow-up referral.  Return precautions given, and he expresses understanding.  ____________________________________________   FINAL CLINICAL  IMPRESSION(S) / ED DIAGNOSES  Final diagnoses:  Seizure (HCC)      NEW MEDICATIONS STARTED DURING THIS VISIT:  New Prescriptions   No medications on file     Note:  This document was prepared using Dragon voice recognition software and may include unintentional dictation errors.    Dionne Bucy, MD 07/31/20 [phone]

## 2020-07-30 NOTE — ED Triage Notes (Signed)
Pt was asleep and family states they saw hime having a seizure. Did not describe the activity. Ems states they saw dried foamy substance around mouth.complains of 6/10 posterior ha.

## 2020-07-31 ENCOUNTER — Other Ambulatory Visit: Payer: Self-pay

## 2020-07-31 DIAGNOSIS — D693 Immune thrombocytopenic purpura: Secondary | ICD-10-CM | POA: Diagnosis not present

## 2020-07-31 LAB — CBC WITH DIFFERENTIAL/PLATELET
Abs Immature Granulocytes: 0.03 10*3/uL (ref 0.00–0.07)
Basophils Absolute: 0 10*3/uL (ref 0.0–0.1)
Basophils Relative: 0 %
Eosinophils Absolute: 0.1 10*3/uL (ref 0.0–0.5)
Eosinophils Relative: 2 %
HCT: 43.5 % (ref 39.0–52.0)
Hemoglobin: 14.2 g/dL (ref 13.0–17.0)
Immature Granulocytes: 0 %
Lymphocytes Relative: 14 %
Lymphs Abs: 1.1 10*3/uL (ref 0.7–4.0)
MCH: 25.5 pg — ABNORMAL LOW (ref 26.0–34.0)
MCHC: 32.6 g/dL (ref 30.0–36.0)
MCV: 78.1 fL — ABNORMAL LOW (ref 80.0–100.0)
Monocytes Absolute: 0.4 10*3/uL (ref 0.1–1.0)
Monocytes Relative: 6 %
Neutro Abs: 5.9 10*3/uL (ref 1.7–7.7)
Neutrophils Relative %: 78 %
Platelets: 85 10*3/uL — ABNORMAL LOW (ref 150–400)
RBC: 5.57 MIL/uL (ref 4.22–5.81)
RDW: 13.7 % (ref 11.5–15.5)
WBC: 7.6 10*3/uL (ref 4.0–10.5)
nRBC: 0 % (ref 0.0–0.2)

## 2020-07-31 LAB — COMPREHENSIVE METABOLIC PANEL
ALT: 26 U/L (ref 0–44)
AST: 23 U/L (ref 15–41)
Albumin: 4 g/dL (ref 3.5–5.0)
Alkaline Phosphatase: 77 U/L (ref 38–126)
Anion gap: 7 (ref 5–15)
BUN: 15 mg/dL (ref 6–20)
CO2: 27 mmol/L (ref 22–32)
Calcium: 8.8 mg/dL — ABNORMAL LOW (ref 8.9–10.3)
Chloride: 106 mmol/L (ref 98–111)
Creatinine, Ser: 0.66 mg/dL (ref 0.61–1.24)
GFR, Estimated: >60 mL/min (ref >60)
Glucose, Bld: 119 mg/dL — ABNORMAL HIGH (ref 70–99)
Potassium: 3.2 mmol/L — ABNORMAL LOW (ref 3.5–5.1)
Sodium: 140 mmol/L (ref 135–145)
Total Bilirubin: 0.8 mg/dL (ref 0.3–1.2)
Total Protein: 7.3 g/dL (ref 6.5–8.1)

## 2020-07-31 NOTE — Discharge Instructions (Addendum)
Follow-up with your primary care doctor.  We have also provided a referral to a neurologist.  Return to the ER for new, worsening, or recurrent seizure-like episodes, weakness or numbness difficulty walking or speaking, changes in vision, or any other new or worsening symptoms that concern you.  We recommend that you do not drive or operate heavy machinery until you follow-up with your primary care doctor and/or the neurologist.

## 2020-08-16 ENCOUNTER — Other Ambulatory Visit: Payer: Self-pay

## 2020-08-16 ENCOUNTER — Inpatient Hospital Stay: Payer: BC Managed Care – PPO | Attending: Oncology

## 2020-08-16 ENCOUNTER — Inpatient Hospital Stay: Payer: BC Managed Care – PPO

## 2020-08-16 DIAGNOSIS — D693 Immune thrombocytopenic purpura: Secondary | ICD-10-CM | POA: Insufficient documentation

## 2020-08-16 LAB — CBC WITH DIFFERENTIAL/PLATELET
Abs Immature Granulocytes: 0.02 10*3/uL (ref 0.00–0.07)
Basophils Absolute: 0 10*3/uL (ref 0.0–0.1)
Basophils Relative: 0 %
Eosinophils Absolute: 0.2 10*3/uL (ref 0.0–0.5)
Eosinophils Relative: 3 %
HCT: 43.6 % (ref 39.0–52.0)
Hemoglobin: 13.7 g/dL (ref 13.0–17.0)
Immature Granulocytes: 0 %
Lymphocytes Relative: 23 %
Lymphs Abs: 1.3 10*3/uL (ref 0.7–4.0)
MCH: 25.1 pg — ABNORMAL LOW (ref 26.0–34.0)
MCHC: 31.4 g/dL (ref 30.0–36.0)
MCV: 79.9 fL — ABNORMAL LOW (ref 80.0–100.0)
Monocytes Absolute: 0.3 10*3/uL (ref 0.1–1.0)
Monocytes Relative: 6 %
Neutro Abs: 3.9 10*3/uL (ref 1.7–7.7)
Neutrophils Relative %: 68 %
Platelets: 91 10*3/uL — ABNORMAL LOW (ref 150–400)
RBC: 5.46 MIL/uL (ref 4.22–5.81)
RDW: 13.9 % (ref 11.5–15.5)
WBC: 5.8 10*3/uL (ref 4.0–10.5)
nRBC: 0 % (ref 0.0–0.2)

## 2020-08-27 ENCOUNTER — Encounter: Payer: Self-pay | Admitting: Oncology

## 2020-09-20 ENCOUNTER — Inpatient Hospital Stay: Payer: BC Managed Care – PPO | Attending: Oncology

## 2020-09-20 DIAGNOSIS — Z452 Encounter for adjustment and management of vascular access device: Secondary | ICD-10-CM | POA: Diagnosis not present

## 2020-09-20 DIAGNOSIS — D693 Immune thrombocytopenic purpura: Secondary | ICD-10-CM | POA: Insufficient documentation

## 2020-09-20 LAB — CBC WITH DIFFERENTIAL/PLATELET
Abs Immature Granulocytes: 0.02 10*3/uL (ref 0.00–0.07)
Basophils Absolute: 0 10*3/uL (ref 0.0–0.1)
Basophils Relative: 0 %
Eosinophils Absolute: 0.2 10*3/uL (ref 0.0–0.5)
Eosinophils Relative: 3 %
HCT: 40.9 % (ref 39.0–52.0)
Hemoglobin: 13.2 g/dL (ref 13.0–17.0)
Immature Granulocytes: 0 %
Lymphocytes Relative: 25 %
Lymphs Abs: 1.4 10*3/uL (ref 0.7–4.0)
MCH: 25.7 pg — ABNORMAL LOW (ref 26.0–34.0)
MCHC: 32.3 g/dL (ref 30.0–36.0)
MCV: 79.6 fL — ABNORMAL LOW (ref 80.0–100.0)
Monocytes Absolute: 0.4 10*3/uL (ref 0.1–1.0)
Monocytes Relative: 7 %
Neutro Abs: 3.5 10*3/uL (ref 1.7–7.7)
Neutrophils Relative %: 65 %
Platelets: 77 10*3/uL — ABNORMAL LOW (ref 150–400)
RBC: 5.14 MIL/uL (ref 4.22–5.81)
RDW: 13.7 % (ref 11.5–15.5)
WBC: 5.5 10*3/uL (ref 4.0–10.5)
nRBC: 0 % (ref 0.0–0.2)

## 2020-09-20 MED ORDER — HEPARIN SOD (PORK) LOCK FLUSH 100 UNIT/ML IV SOLN
500.0000 [IU] | Freq: Once | INTRAVENOUS | Status: DC
  Filled 2020-09-20: qty 5

## 2020-09-20 MED ORDER — SODIUM CHLORIDE 0.9% FLUSH
10.0000 mL | Freq: Once | INTRAVENOUS | Status: AC
Start: 2020-09-20 — End: 2020-09-20
  Administered 2020-09-20: 10 mL via INTRAVENOUS
  Filled 2020-09-20: qty 10

## 2020-09-20 MED ORDER — HEPARIN SOD (PORK) LOCK FLUSH 100 UNIT/ML IV SOLN
INTRAVENOUS | Status: AC
  Administered 2020-09-20: 500 [IU]
  Filled 2020-09-20: qty 5

## 2020-10-19 NOTE — Progress Notes (Signed)
[place]  Telephone:(336) [phone] Fax:(336) [phone]  ID: Samuel Rojas OB: 09-09-1990  MR#: [id number]  KVQ#:[id number]  Patient Care Team: Center, [facility] as PCP - General (General Practice) Lloyd Huger, MD as Consulting Physician (Hematology and Oncology)    CHIEF COMPLAINT: ITP, rhabdomyolysis.  INTERVAL HISTORY: Patient returns to clinic today for repeat laboratory work and further evaluation.  He states he was recently diagnosed with "nocturnal seizures" which possibly may explain his history of rhabdomyolysis.  He currently feels well and is asymptomatic. He denies any easy bleeding or bruising.  He has no neurologic complaints or recent seizures. He denies any recent fevers or illnesses. He has no chest pain, shortness of breath, cough, or hemoptysis.  He denies any nausea, vomiting, constipation, or diarrhea. He has no urinary complaints.  Patient offers no specific complaints today.  REVIEW OF SYSTEMS:   Review of Systems  Constitutional: Negative.  Negative for fever, malaise/fatigue and weight loss.  HENT: Negative.  Negative for nosebleeds.   Respiratory: Negative.  Negative for cough, hemoptysis and shortness of breath.   Cardiovascular: Negative.  Negative for chest pain and leg swelling.  Gastrointestinal: Negative.  Negative for abdominal pain, blood in stool and melena.  Genitourinary: Negative.  Negative for hematuria.  Musculoskeletal: Negative.  Negative for back pain and myalgias.  Skin: Negative.  Negative for rash.  Neurological: Negative.  Negative for sensory change, focal weakness and weakness.  Endo/Heme/Allergies:  Does not bruise/bleed easily.  Psychiatric/Behavioral: Negative.  The patient is not nervous/anxious.    As per HPI. Otherwise, a complete review of systems is negative.  PAST MEDICAL HISTORY: Past Medical History:  Diagnosis Date   Cellulitis    left leg   Idiopathic  thrombocytopenic purpura (ITP) ([facility])     PAST SURGICAL HISTORY: Past Surgical History:  Procedure Laterality Date   PORTA CATH INSERTION N/A 09/20/2017   Procedure: PORTA CATH INSERTION;  Surgeon: Algernon Huxley, MD;  Location: [facility];  Service: Cardiovascular;  Laterality: N/A;   TONSILLECTOMY      FAMILY HISTORY: Family History  Problem Relation Age of Onset   Hypertension Mother    Diabetes Mother        ADVANCED DIRECTIVES:    HEALTH MAINTENANCE: Social History   Tobacco Use   Smoking status: Never   Smokeless tobacco: Never  Vaping Use   Vaping Use: Never used  Substance Use Topics   Alcohol use: No   Drug use: No     Colonoscopy:  PAP:  Bone density:  Lipid panel:  Allergies  Allergen Reactions   Augmentin [Amoxicillin-Pot Clavulanate] Hives   Sulfamethoxazole-Trimethoprim     Dehydration, confusion , high BP    Current Outpatient Medications  Medication Sig Dispense Refill   predniSONE (DELTASONE) 50 MG tablet Take 2 tablets (100 mg) daily for 5 days. (Patient not taking: No sig reported) 10 tablet 0   No current facility-administered medications for this visit.   Facility-Administered Medications Ordered in Other Visits  Medication Dose Route Frequency Provider Last Rate Last Admin   famotidine (PEPCID) IVPB 20 mg premix  20 mg Intravenous Q12H Lloyd Huger, MD   Stopped at 10/01/17 1001   heparin lock flush 100 unit/mL  500 Units Intracatheter Once Lloyd Huger, MD       sodium chloride flush (NS) 0.9 % injection 10 mL  10 mL Intracatheter PRN Lloyd Huger, MD   10 mL at 10/01/17 [phone]  sodium chloride flush (NS) 0.9 % injection 10 mL  10 mL Intravenous PRN Lloyd Huger, MD   10 mL at 02/27/20 1450    OBJECTIVE: Vitals:   10/24/20 1518  BP: 136/70  Pulse: 80  Resp: 20  Temp: (!) 97 F (36.1 C)  SpO2: 100%     Body mass index is 59.24 kg/m.    ECOG FS:0 - Asymptomatic  General: Well-developed,  well-nourished, no acute distress. Eyes: Pink conjunctiva, anicteric sclera. HEENT: Normocephalic, moist mucous membranes. Lungs: No audible wheezing or coughing. Heart: Regular rate and rhythm. Abdomen: Soft, nontender, no obvious distention. Musculoskeletal: No edema, cyanosis, or clubbing. Neuro: Alert, answering all questions appropriately. Cranial nerves grossly intact. Skin: No rashes or petechiae noted. Psych: Normal affect.   LAB RESULTS:  Lab Results  Component Value Date   NA 140 07/30/2020   K 3.2 (L) 07/30/2020   CL 106 07/30/2020   CO2 27 07/30/2020   GLUCOSE 119 (H) 07/30/2020   BUN 15 07/30/2020   CREATININE 0.66 07/30/2020   CALCIUM 8.8 (L) 07/30/2020   PROT 7.3 07/30/2020   ALBUMIN 4.0 07/30/2020   AST 23 07/30/2020   ALT 26 07/30/2020   ALKPHOS 77 07/30/2020   BILITOT 0.8 07/30/2020   GFRNONAA >60 07/30/2020   GFRAA >60 08/26/2019    Lab Results  Component Value Date   WBC 7.6 10/24/2020   NEUTROABS 5.4 10/24/2020   HGB 13.8 10/24/2020   HCT 42.8 10/24/2020   MCV 79.0 (L) 10/24/2020   PLT 77 (L) 10/24/2020   Lab Results  Component Value Date   IRON 50 09/09/2015   TIBC 320 09/09/2015   IRONPCTSAT 16 (L) 09/09/2015   Lab Results  Component Value Date   FERRITIN 75 09/09/2015     STUDIES: No results found.  TREATMENT HISTORY: Weekly Rituxan x4 completed on October 08, 2017 Weekly Rituxan x4 completed on September 09, 2018 Weekly Rituxan x4 completed on  March 22, 2020  ASSESSMENT: ITP, rhabdomyolysis.  PLAN:     1.  ITP: Previously, patient received 5 days of 100 mg prednisone with an excellent response to his platelets increasing to greater than 200.  Unfortunately, response was not durable. 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  Splenic ultrasound did not reveal any evidence of splenomegaly.  Bone marrow biopsy was not completed.  Patient has had multiple rounds of weekly Rituxan x4.   See above for patient treatment history.  Typically he has a delayed increase in his platelet count after receiving treatment.  Patient's platelet count remains decreased, but stable at 77.  No intervention is needed at this time.  Return to clinic in 3 months for laboratory work only and then in 6 months for laboratory work and video assisted telemedicine visit.   2.  Poor venous access: Patient now has a port.  Continue port flushes every 6-8 weeks. 3.  Nontraumatic rhabdomyolysis: Resolved.  CK levels are within normal limits.  Possibly related to recently diagnosed "nocturnal seizures".   Patient expressed understanding and was in agreement with this plan. He also understands that He can call clinic at any time with any questions, concerns, or complaints.    Lloyd Huger, MD   10/27/2020 9:50 AM

## 2020-10-24 ENCOUNTER — Inpatient Hospital Stay (HOSPITAL_BASED_OUTPATIENT_CLINIC_OR_DEPARTMENT_OTHER): Payer: BC Managed Care – PPO | Admitting: Oncology

## 2020-10-24 ENCOUNTER — Encounter: Payer: Self-pay | Admitting: Oncology

## 2020-10-24 ENCOUNTER — Inpatient Hospital Stay: Payer: BC Managed Care – PPO | Attending: Oncology

## 2020-10-24 VITALS — BP 136/70 | HR 80 | Temp 97.0°F | Resp 20 | Wt 367.0 lb

## 2020-10-24 DIAGNOSIS — Z79899 Other long term (current) drug therapy: Secondary | ICD-10-CM | POA: Diagnosis not present

## 2020-10-24 DIAGNOSIS — M6282 Rhabdomyolysis: Secondary | ICD-10-CM | POA: Diagnosis not present

## 2020-10-24 DIAGNOSIS — Z7952 Long term (current) use of systemic steroids: Secondary | ICD-10-CM | POA: Insufficient documentation

## 2020-10-24 DIAGNOSIS — D693 Immune thrombocytopenic purpura: Secondary | ICD-10-CM | POA: Diagnosis not present

## 2020-10-24 LAB — CBC WITH DIFFERENTIAL/PLATELET
Abs Immature Granulocytes: 0.02 10*3/uL (ref 0.00–0.07)
Basophils Absolute: 0 10*3/uL (ref 0.0–0.1)
Basophils Relative: 0 %
Eosinophils Absolute: 0.2 10*3/uL (ref 0.0–0.5)
Eosinophils Relative: 3 %
HCT: 42.8 % (ref 39.0–52.0)
Hemoglobin: 13.8 g/dL (ref 13.0–17.0)
Immature Granulocytes: 0 %
Lymphocytes Relative: 21 %
Lymphs Abs: 1.6 10*3/uL (ref 0.7–4.0)
MCH: 25.5 pg — ABNORMAL LOW (ref 26.0–34.0)
MCHC: 32.2 g/dL (ref 30.0–36.0)
MCV: 79 fL — ABNORMAL LOW (ref 80.0–100.0)
Monocytes Absolute: 0.4 10*3/uL (ref 0.1–1.0)
Monocytes Relative: 5 %
Neutro Abs: 5.4 10*3/uL (ref 1.7–7.7)
Neutrophils Relative %: 71 %
Platelets: 77 10*3/uL — ABNORMAL LOW (ref 150–400)
RBC: 5.42 MIL/uL (ref 4.22–5.81)
RDW: 13.8 % (ref 11.5–15.5)
WBC: 7.6 10*3/uL (ref 4.0–10.5)
nRBC: 0 % (ref 0.0–0.2)

## 2020-10-24 MED ORDER — HEPARIN SOD (PORK) LOCK FLUSH 100 UNIT/ML IV SOLN
INTRAVENOUS | Status: AC
  Filled 2020-10-24: qty 5

## 2020-10-24 MED ORDER — SODIUM CHLORIDE 0.9% FLUSH
10.0000 mL | Freq: Once | INTRAVENOUS | Status: AC
  Administered 2020-10-24: 10 mL
  Filled 2020-10-24: qty 10

## 2020-10-24 MED ORDER — HEPARIN SOD (PORK) LOCK FLUSH 100 UNIT/ML IV SOLN
500.0000 [IU] | Freq: Once | INTRAVENOUS | Status: AC
Start: 2020-10-24 — End: ?
  Filled 2020-10-24: qty 5

## 2020-10-27 ENCOUNTER — Encounter: Payer: Self-pay | Admitting: Oncology

## 2020-12-13 ENCOUNTER — Inpatient Hospital Stay: Payer: BC Managed Care – PPO | Attending: Oncology

## 2021-01-24 ENCOUNTER — Inpatient Hospital Stay: Payer: BC Managed Care – PPO | Attending: Oncology

## 2021-04-15 ENCOUNTER — Other Ambulatory Visit: Payer: Self-pay | Admitting: *Deleted

## 2021-04-15 DIAGNOSIS — D693 Immune thrombocytopenic purpura: Secondary | ICD-10-CM

## 2021-04-25 ENCOUNTER — Other Ambulatory Visit: Payer: Self-pay

## 2021-04-25 ENCOUNTER — Inpatient Hospital Stay: Payer: BC Managed Care – PPO | Attending: Oncology

## 2021-04-25 DIAGNOSIS — Z5112 Encounter for antineoplastic immunotherapy: Secondary | ICD-10-CM | POA: Diagnosis present

## 2021-04-25 DIAGNOSIS — Z452 Encounter for adjustment and management of vascular access device: Secondary | ICD-10-CM | POA: Diagnosis not present

## 2021-04-25 DIAGNOSIS — D693 Immune thrombocytopenic purpura: Secondary | ICD-10-CM | POA: Diagnosis present

## 2021-04-25 DIAGNOSIS — M6282 Rhabdomyolysis: Secondary | ICD-10-CM | POA: Diagnosis not present

## 2021-04-25 LAB — CBC WITH DIFFERENTIAL/PLATELET
Abs Immature Granulocytes: 0.01 10*3/uL (ref 0.00–0.07)
Basophils Absolute: 0 10*3/uL (ref 0.0–0.1)
Basophils Relative: 0 %
Eosinophils Absolute: 0.2 10*3/uL (ref 0.0–0.5)
Eosinophils Relative: 3 %
HCT: 41.2 % (ref 39.0–52.0)
Hemoglobin: 13.4 g/dL (ref 13.0–17.0)
Immature Granulocytes: 0 %
Lymphocytes Relative: 26 %
Lymphs Abs: 1.4 10*3/uL (ref 0.7–4.0)
MCH: 25.5 pg — ABNORMAL LOW (ref 26.0–34.0)
MCHC: 32.5 g/dL (ref 30.0–36.0)
MCV: 78.5 fL — ABNORMAL LOW (ref 80.0–100.0)
Monocytes Absolute: 0.3 10*3/uL (ref 0.1–1.0)
Monocytes Relative: 5 %
Neutro Abs: 3.6 10*3/uL (ref 1.7–7.7)
Neutrophils Relative %: 66 %
Platelets: 33 10*3/uL — ABNORMAL LOW (ref 150–400)
RBC: 5.25 MIL/uL (ref 4.22–5.81)
RDW: 13.6 % (ref 11.5–15.5)
WBC: 5.4 10*3/uL (ref 4.0–10.5)
nRBC: 0 % (ref 0.0–0.2)

## 2021-04-25 MED ORDER — SODIUM CHLORIDE 0.9% FLUSH
10.0000 mL | Freq: Once | INTRAVENOUS | Status: AC
  Administered 2021-04-25: 10 mL via INTRAVENOUS
  Filled 2021-04-25: qty 10

## 2021-04-25 MED ORDER — HEPARIN SOD (PORK) LOCK FLUSH 100 UNIT/ML IV SOLN
500.0000 [IU] | Freq: Once | INTRAVENOUS | Status: AC
  Administered 2021-04-25: 500 [IU] via INTRAVENOUS
  Filled 2021-04-25: qty 5

## 2021-04-27 NOTE — Progress Notes (Signed)
?[place]  ?Telephone:(336) [phone] [phone] ? ?ID: Samuel Rojas OB: 09/04/1990  MR#: [id number]  JYN#:[id number] ? ?Patient Care Team: ?Center, [facility] as PCP - General (General Practice) ?Lloyd Huger, MD as Consulting Physician (Hematology and Oncology) ? ?I connected with Samuel Rojas on 04/27/21 at  2:45 PM EDT by video enabled telemedicine visit and verified that I am speaking with the correct person using two identifiers.  ? ?I discussed the limitations, risks, security and privacy concerns of performing an evaluation and management service by telemedicine and the availability of in-person appointments. I also discussed with the patient that there may be a patient responsible charge related to this service. The patient expressed understanding and agreed to proceed.  ? ?Other persons participating in the visit and their role in the encounter: Patient, MD. ? ?Patient?s location: Home. ?Provider?s location: Clinic. ? ?I connected with Samuel Rojas on 05/01/21 at  2:45 PM EDT by video enabled telemedicine visit and verified that I am speaking with the correct person using two identifiers.  ? ?I discussed the limitations, risks, security and privacy concerns of performing an evaluation and management service by telemedicine and the availability of in-person appointments. I also discussed with the patient that there may be a patient responsible charge related to this service. The patient expressed understanding and agreed to proceed.  ? ?Other persons participating in the visit and their role in the encounter: Patient, MD. ? ?Patient?s location: Home. ?Provider?s location: Clinic. ?CHIEF COMPLAINT: ITP, rhabdomyolysis. ? ?INTERVAL HISTORY: Patient agreed to video assisted telemedicine visit for further evaluation, discussion of his laboratory results, and treatment planning.  He continues to feel well and remains  asymptomatic.  He continues to be active and work full-time.  \He denies any easy bleeding or bruising.  He has no neurologic complaints or recent seizures. He denies any recent fevers or illnesses. He has no chest pain, shortness of breath, cough, or hemoptysis.  He denies any nausea, vomiting, constipation, or diarrhea. He has no urinary complaints.  Patient offers no specific complaints today. ? ?REVIEW OF SYSTEMS:   ?Review of Systems  ?Constitutional: Negative.  Negative for fever, malaise/fatigue and weight loss.  ?HENT: Negative.  Negative for nosebleeds.   ?Respiratory: Negative.  Negative for cough, hemoptysis and shortness of breath.   ?Cardiovascular: Negative.  Negative for chest pain and leg swelling.  ?Gastrointestinal: Negative.  Negative for abdominal pain, blood in stool and melena.  ?Genitourinary: Negative.  Negative for hematuria.  ?Musculoskeletal: Negative.  Negative for back pain and myalgias.  ?Skin: Negative.  Negative for rash.  ?Neurological: Negative.  Negative for sensory change, focal weakness and weakness.  ?Endo/Heme/Allergies:  Does not bruise/bleed easily.  ?Psychiatric/Behavioral: Negative.  The patient is not nervous/anxious.   ? ?As per HPI. Otherwise, a complete review of systems is negative. ? ?PAST MEDICAL HISTORY: ?Past Medical History:  ?Diagnosis Date  ? Cellulitis   ? left leg  ? Idiopathic thrombocytopenic purpura (ITP) ([facility])   ? ? ?PAST SURGICAL HISTORY: ?Past Surgical History:  ?Procedure Laterality Date  ? PORTA CATH INSERTION N/A 09/20/2017  ? Procedure: PORTA CATH INSERTION;  Surgeon: Algernon Huxley, MD;  Location: [facility];  Service: Cardiovascular;  Laterality: N/A;  ? TONSILLECTOMY    ? ? ?FAMILY HISTORY: ?Family History  ?Problem Relation Age of Onset  ? Hypertension Mother   ? Diabetes Mother   ? ? ?  ? ADVANCED DIRECTIVES:  ? ? ?  HEALTH MAINTENANCE: ?Social History  ? ?Tobacco Use  ? Smoking status: Never  ? Smokeless tobacco: Never  ?Vaping Use  ?  Vaping Use: Never used  ?Substance Use Topics  ? Alcohol use: No  ? Drug use: No  ? ? ? Colonoscopy: ? PAP: ? Bone density: ? Lipid panel: ? ?Allergies  ?Allergen Reactions  ? Augmentin [Amoxicillin-Pot Clavulanate] Hives  ? Sulfamethoxazole-Trimethoprim   ?  Dehydration, confusion , high BP  ? ? ?Current Outpatient Medications  ?Medication Sig Dispense Refill  ? predniSONE (DELTASONE) 50 MG tablet Take 2 tablets (100 mg) daily for 5 days. (Patient not taking: No sig reported) 10 tablet 0  ? ?No current facility-administered medications for this visit.  ? ?Facility-Administered Medications Ordered in Other Visits  ?Medication Dose Route Frequency Provider Last Rate Last Admin  ? famotidine (PEPCID) IVPB 20 mg premix  20 mg Intravenous Q12H Lloyd Huger, MD   Stopped at 10/01/17 1001  ? heparin lock flush 100 unit/mL  500 Units Intracatheter Once Lloyd Huger, MD      ? sodium chloride flush (NS) 0.9 % injection 10 mL  10 mL Intracatheter PRN Lloyd Huger, MD   10 mL at 10/01/17 0855  ? sodium chloride flush (NS) 0.9 % injection 10 mL  10 mL Intravenous PRN Lloyd Huger, MD   10 mL at 02/27/20 1450  ? ? ?OBJECTIVE: ?There were no vitals filed for this visit. ?   There is no height or weight on file to calculate BMI.    ECOG FS:0 - Asymptomatic ? ?General: Well-developed, well-nourished, no acute distress. ?HEENT: Normocephalic. ?Neuro: Alert, answering all questions appropriately. Cranial nerves grossly intact. ?Psych: Normal affect. ? ?LAB RESULTS: ? ?Lab Results  ?Component Value Date  ? NA 140 07/30/2020  ? K 3.2 (L) 07/30/2020  ? CL 106 07/30/2020  ? CO2 27 07/30/2020  ? GLUCOSE 119 (H) 07/30/2020  ? BUN 15 07/30/2020  ? CREATININE 0.66 07/30/2020  ? CALCIUM 8.8 (L) 07/30/2020  ? PROT 7.3 07/30/2020  ? ALBUMIN 4.0 07/30/2020  ? AST 23 07/30/2020  ? ALT 26 07/30/2020  ? ALKPHOS 77 07/30/2020  ? BILITOT 0.8 07/30/2020  ? GFRNONAA >60 07/30/2020  ? GFRAA >60 08/26/2019  ? ? ?Lab Results   ?Component Value Date  ? WBC 5.4 04/25/2021  ? NEUTROABS 3.6 04/25/2021  ? HGB 13.4 04/25/2021  ? HCT 41.2 04/25/2021  ? MCV 78.5 (L) 04/25/2021  ? PLT 33 (L) 04/25/2021  ? ?Lab Results  ?Component Value Date  ? IRON 50 09/09/2015  ? TIBC 320 09/09/2015  ? IRONPCTSAT 16 (L) 09/09/2015  ? ?Lab Results  ?Component Value Date  ? FERRITIN 75 09/09/2015  ? ? ? ?STUDIES: ?No results found. ? ?TREATMENT HISTORY: ?Weekly Rituxan x4 completed on October 08, 2017 ?Weekly Rituxan x4 completed on September 09, 2018 ?Weekly Rituxan x4 completed on  March 22, 2020 ?Weekly Rituxan x4 completed on May 23, 2021 ? ?ASSESSMENT: ITP, rhabdomyolysis. ? ?PLAN:    ? ?1.  ITP: Previously, patient received 5 days of 100 mg prednisone with an excellent response to his platelets increasing to greater than 200.  Unfortunately, response was not durable. 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  Splenic ultrasound did not reveal any evidence of splenomegaly.  Bone marrow biopsy was not completed.  Patient has had multiple rounds of weekly Rituxan x4.  See above for patient treatment history.  Typically he has  a delayed increase in his platelet count after receiving treatment.  Patient's platelet count has dropped to 33, therefore we will reinitiate Rituxan weekly x4 on May 02, 2021.  Patient will have video-assisted telemedicine visit prior to cycle 3 for further evaluation.   ?2.  Poor venous access: Patient now has a port.  Continue port flushes every 6-8 weeks. ?3.  Nontraumatic rhabdomyolysis: Resolved. Possibly related to recently diagnosed "nocturnal seizures" brought on by sleep apnea. ? ?I provided 30 minutes of face-to-face video visit time during this encounter which included chart review, counseling, and coordination of care as documented above. ? ? ?Patient expressed understanding and was in agreement with this plan. He also understands that He can call clinic at any time with any  questions, concerns, or complaints.  ? ? ?Lloyd Huger, MD   04/27/2021 8:49 AM ? ? ? ? ?

## 2021-04-29 ENCOUNTER — Inpatient Hospital Stay (HOSPITAL_BASED_OUTPATIENT_CLINIC_OR_DEPARTMENT_OTHER): Payer: BC Managed Care – PPO | Admitting: Oncology

## 2021-04-29 DIAGNOSIS — D693 Immune thrombocytopenic purpura: Secondary | ICD-10-CM | POA: Diagnosis not present

## 2021-05-01 ENCOUNTER — Encounter: Payer: Self-pay | Admitting: Oncology

## 2021-05-01 ENCOUNTER — Telehealth: Payer: Self-pay | Admitting: *Deleted

## 2021-05-01 NOTE — Telephone Encounter (Signed)
Patient left vm on scheduling line after 4pm last evening. He was returning the scheduling phone call regarding his apts on Friday. I attempted to reach the patient back. He didn't answer. I left a detailed vm that he has an apt on Friday 3/24 at 8am. I asked him to send a mychart msg or call back if he still needs to discuss his apts. ?

## 2021-05-01 NOTE — Telephone Encounter (Signed)
Pt aware of appts and said he got it straight so we good. ?

## 2021-05-02 ENCOUNTER — Inpatient Hospital Stay: Payer: BC Managed Care – PPO

## 2021-05-02 ENCOUNTER — Other Ambulatory Visit: Payer: Self-pay

## 2021-05-02 VITALS — BP 131/81 | HR 82 | Temp 97.0°F | Resp 19

## 2021-05-02 DIAGNOSIS — D693 Immune thrombocytopenic purpura: Secondary | ICD-10-CM

## 2021-05-02 DIAGNOSIS — Z5112 Encounter for antineoplastic immunotherapy: Secondary | ICD-10-CM | POA: Diagnosis not present

## 2021-05-02 DIAGNOSIS — D696 Thrombocytopenia, unspecified: Secondary | ICD-10-CM

## 2021-05-02 LAB — CBC WITH DIFFERENTIAL/PLATELET
Abs Immature Granulocytes: 0 10*3/uL (ref 0.00–0.07)
Basophils Absolute: 0 10*3/uL (ref 0.0–0.1)
Basophils Relative: 0 %
Eosinophils Absolute: 0.2 10*3/uL (ref 0.0–0.5)
Eosinophils Relative: 3 %
HCT: 42.4 % (ref 39.0–52.0)
Hemoglobin: 13.7 g/dL (ref 13.0–17.0)
Immature Granulocytes: 0 %
Lymphocytes Relative: 27 %
Lymphs Abs: 1.2 10*3/uL (ref 0.7–4.0)
MCH: 25.3 pg — ABNORMAL LOW (ref 26.0–34.0)
MCHC: 32.3 g/dL (ref 30.0–36.0)
MCV: 78.4 fL — ABNORMAL LOW (ref 80.0–100.0)
Monocytes Absolute: 0.2 10*3/uL (ref 0.1–1.0)
Monocytes Relative: 5 %
Neutro Abs: 2.9 10*3/uL (ref 1.7–7.7)
Neutrophils Relative %: 65 %
Platelets: 54 10*3/uL — ABNORMAL LOW (ref 150–400)
RBC: 5.41 MIL/uL (ref 4.22–5.81)
RDW: 13.7 % (ref 11.5–15.5)
WBC: 4.5 10*3/uL (ref 4.0–10.5)
nRBC: 0 % (ref 0.0–0.2)

## 2021-05-02 MED ORDER — FAMOTIDINE IN NACL 20-0.9 MG/50ML-% IV SOLN
20.0000 mg | Freq: Two times a day (BID) | INTRAVENOUS | Status: DC
  Administered 2021-05-02: 20 mg via INTRAVENOUS
  Filled 2021-05-02: qty 50

## 2021-05-02 MED ORDER — SODIUM CHLORIDE 0.9 % IV SOLN
Freq: Once | INTRAVENOUS | Status: AC
  Filled 2021-05-02: qty 250

## 2021-05-02 MED ORDER — SODIUM CHLORIDE 0.9 % IV SOLN
375.0000 mg/m2 | Freq: Once | INTRAVENOUS | Status: AC
  Administered 2021-05-02: 1000 mg via INTRAVENOUS
  Filled 2021-05-02: qty 100

## 2021-05-02 MED ORDER — HEPARIN SOD (PORK) LOCK FLUSH 100 UNIT/ML IV SOLN
500.0000 [IU] | Freq: Once | INTRAVENOUS | Status: AC | PRN
  Filled 2021-05-02: qty 5

## 2021-05-02 MED ORDER — DIPHENHYDRAMINE HCL 50 MG/ML IJ SOLN
50.0000 mg | Freq: Once | INTRAMUSCULAR | Status: AC
  Administered 2021-05-02: 50 mg via INTRAVENOUS
  Filled 2021-05-02: qty 1

## 2021-05-02 MED ORDER — SODIUM CHLORIDE 0.9 % IV SOLN
20.0000 mg | Freq: Once | INTRAVENOUS | Status: AC
  Administered 2021-05-02: 20 mg via INTRAVENOUS
  Filled 2021-05-02: qty 20

## 2021-05-02 MED ORDER — SODIUM CHLORIDE 0.9 % IV SOLN: 375.0000 mg/m2 | Freq: Once | INTRAVENOUS | Status: DC

## 2021-05-02 MED ORDER — ACETAMINOPHEN 325 MG PO TABS
650.0000 mg | ORAL_TABLET | Freq: Once | ORAL | Status: AC
  Administered 2021-05-02: 650 mg via ORAL
  Filled 2021-05-02: qty 2

## 2021-05-02 MED ORDER — HEPARIN SOD (PORK) LOCK FLUSH 100 UNIT/ML IV SOLN
INTRAVENOUS | Status: AC
  Administered 2021-05-02: 500 [IU]
  Filled 2021-05-02: qty 5

## 2021-05-02 NOTE — Patient Instructions (Signed)
[facility]  Discharge Instructions: Thank you for choosing [facility] to provide your oncology and hematology care.  If you have a lab appointment with the Cancer Center, please go directly to the Cancer Center and check in at the registration area.  Wear comfortable clothing and clothing appropriate for easy access to any Portacath or PICC line.   We strive to give you quality time with your provider. You may need to reschedule your appointment if you arrive late (15 or more minutes).  Arriving late affects you and other patients whose appointments are after yours.  Also, if you miss three or more appointments without notifying the office, you may be dismissed from the clinic at the provider's discretion.      For prescription refill requests, have your pharmacy contact our office and allow 72 hours for refills to be completed.    Today you received the following chemotherapy and/or immunotherapy agents Ruxience    To help prevent nausea and vomiting after your treatment, we encourage you to take your nausea medication as directed.  BELOW ARE SYMPTOMS THAT SHOULD BE REPORTED IMMEDIATELY: *FEVER GREATER THAN 100.4 F (38 C) OR HIGHER *CHILLS OR SWEATING *NAUSEA AND VOMITING THAT IS NOT CONTROLLED WITH YOUR NAUSEA MEDICATION *UNUSUAL SHORTNESS OF BREATH *UNUSUAL BRUISING OR BLEEDING *URINARY PROBLEMS (pain or burning when urinating, or frequent urination) *BOWEL PROBLEMS (unusual diarrhea, constipation, pain near the anus) TENDERNESS IN MOUTH AND THROAT WITH OR WITHOUT PRESENCE OF ULCERS (sore throat, sores in mouth, or a toothache) UNUSUAL RASH, SWELLING OR PAIN  UNUSUAL VAGINAL DISCHARGE OR ITCHING   Items with * indicate a potential emergency and should be followed up as soon as possible or go to the Emergency Department if any problems should occur.  Please show the CHEMOTHERAPY ALERT CARD or IMMUNOTHERAPY ALERT CARD at check-in to the  Emergency Department and triage nurse.  Should you have questions after your visit or need to cancel or reschedule your appointment, please contact [facility]  [phone] and follow the prompts.  Office hours are 8:00 a.m. to 4:30 p.m. Monday - Friday. Please note that voicemails left after 4:00 p.m. may not be returned until the following business day.  We are closed weekends and major holidays. You have access to a nurse at all times for urgent questions. Please call the main number to the clinic [phone] and follow the prompts.  For any non-urgent questions, you may also contact your provider using MyChart. We now offer e-Visits for anyone 18 and older to request care online for non-urgent symptoms. For details visit [email or web address].   Also download the MyChart app! Go to the app store, search "MyChart", open the app, select [facility], and log in with your MyChart username and password.  Due to Covid, a mask is required upon entering the hospital/clinic. If you do not have a mask, one will be given to you upon arrival. For doctor visits, patients may have 1 support person aged 18 or older with them. For treatment visits, patients cannot have anyone with them due to current Covid guidelines and our immunocompromised population.  

## 2021-05-06 ENCOUNTER — Other Ambulatory Visit: Payer: Self-pay | Admitting: *Deleted

## 2021-05-06 DIAGNOSIS — D693 Immune thrombocytopenic purpura: Secondary | ICD-10-CM

## 2021-05-09 ENCOUNTER — Inpatient Hospital Stay: Payer: BC Managed Care – PPO

## 2021-05-09 VITALS — BP 116/69 | HR 58 | Temp 97.7°F | Resp 19 | Wt 364.2 lb

## 2021-05-09 DIAGNOSIS — D693 Immune thrombocytopenic purpura: Secondary | ICD-10-CM

## 2021-05-09 DIAGNOSIS — D696 Thrombocytopenia, unspecified: Secondary | ICD-10-CM

## 2021-05-09 DIAGNOSIS — Z5112 Encounter for antineoplastic immunotherapy: Secondary | ICD-10-CM | POA: Diagnosis not present

## 2021-05-09 LAB — CBC WITH DIFFERENTIAL/PLATELET
Abs Immature Granulocytes: 0.01 10*3/uL (ref 0.00–0.07)
Basophils Absolute: 0 10*3/uL (ref 0.0–0.1)
Basophils Relative: 0 %
Eosinophils Absolute: 0.2 10*3/uL (ref 0.0–0.5)
Eosinophils Relative: 3 %
HCT: 41.7 % (ref 39.0–52.0)
Hemoglobin: 13.4 g/dL (ref 13.0–17.0)
Immature Granulocytes: 0 %
Lymphocytes Relative: 20 %
Lymphs Abs: 1.2 10*3/uL (ref 0.7–4.0)
MCH: 25.4 pg — ABNORMAL LOW (ref 26.0–34.0)
MCHC: 32.1 g/dL (ref 30.0–36.0)
MCV: 79.1 fL — ABNORMAL LOW (ref 80.0–100.0)
Monocytes Absolute: 0.3 10*3/uL (ref 0.1–1.0)
Monocytes Relative: 5 %
Neutro Abs: 4.3 10*3/uL (ref 1.7–7.7)
Neutrophils Relative %: 72 %
Platelets: 107 10*3/uL — ABNORMAL LOW (ref 150–400)
RBC: 5.27 MIL/uL (ref 4.22–5.81)
RDW: 13.9 % (ref 11.5–15.5)
WBC: 6 10*3/uL (ref 4.0–10.5)
nRBC: 0 % (ref 0.0–0.2)

## 2021-05-09 MED ORDER — DIPHENHYDRAMINE HCL 50 MG/ML IJ SOLN
50.0000 mg | Freq: Once | INTRAMUSCULAR | Status: AC
  Administered 2021-05-09: 50 mg via INTRAVENOUS
  Filled 2021-05-09: qty 1

## 2021-05-09 MED ORDER — HEPARIN SOD (PORK) LOCK FLUSH 100 UNIT/ML IV SOLN
500.0000 [IU] | Freq: Once | INTRAVENOUS | Status: DC | PRN
  Filled 2021-05-09: qty 5

## 2021-05-09 MED ORDER — SODIUM CHLORIDE 0.9% FLUSH
10.0000 mL | Freq: Once | INTRAVENOUS | Status: AC
  Administered 2021-05-09: 10 mL via INTRAVENOUS
  Filled 2021-05-09: qty 10

## 2021-05-09 MED ORDER — SODIUM CHLORIDE 0.9 % IV SOLN
20.0000 mg | Freq: Once | INTRAVENOUS | Status: AC
  Administered 2021-05-09: 20 mg via INTRAVENOUS
  Filled 2021-05-09: qty 20

## 2021-05-09 MED ORDER — HEPARIN SOD (PORK) LOCK FLUSH 100 UNIT/ML IV SOLN
INTRAVENOUS | Status: AC
  Administered 2021-05-09: 500 [IU]
  Filled 2021-05-09: qty 5

## 2021-05-09 MED ORDER — ACETAMINOPHEN 325 MG PO TABS
650.0000 mg | ORAL_TABLET | Freq: Once | ORAL | Status: AC
  Administered 2021-05-09: 650 mg via ORAL
  Filled 2021-05-09: qty 2

## 2021-05-09 MED ORDER — SODIUM CHLORIDE 0.9 % IV SOLN: 375.0000 mg/m2 | Freq: Once | INTRAVENOUS | Status: DC

## 2021-05-09 MED ORDER — HEPARIN SOD (PORK) LOCK FLUSH 100 UNIT/ML IV SOLN
500.0000 [IU] | Freq: Once | INTRAVENOUS | Status: DC
  Filled 2021-05-09: qty 5

## 2021-05-09 MED ORDER — FAMOTIDINE IN NACL 20-0.9 MG/50ML-% IV SOLN
20.0000 mg | Freq: Two times a day (BID) | INTRAVENOUS | Status: DC
  Administered 2021-05-09: 20 mg via INTRAVENOUS
  Filled 2021-05-09: qty 50

## 2021-05-09 MED ORDER — SODIUM CHLORIDE 0.9 % IV SOLN
375.0000 mg/m2 | Freq: Once | INTRAVENOUS | Status: AC
  Administered 2021-05-09: 1000 mg via INTRAVENOUS
  Filled 2021-05-09: qty 100

## 2021-05-09 MED ORDER — SODIUM CHLORIDE 0.9 % IV SOLN
Freq: Once | INTRAVENOUS | Status: AC
  Filled 2021-05-09: qty 250

## 2021-05-09 NOTE — Progress Notes (Signed)
Rapid Infusion Rituximab Pharmacist Evaluation ? ?Samuel Rojas is a 31 y.o. male being treated with rituximab for ITP. This patient may be considered for RIR.  ? ?A pharmacist has verified the patient tolerated rituximab infusions per the [facility] standard infusion protocol without grade 3-4 infusion reactions. The treatment plan will be updated to reflect RIR if the patient qualifies per the checklist below:  ? ?Age > 13 years old Yes   ?Clinically significant cardiovascular disease No   ?Circulating lymphocyte count < 5000/uL prior to cycle two Yes  ?Lab Results  ?Component Value Date  ? LYMPHSABS 1.2 05/09/2021  ?  ?Prior documented grade 3-4 infusion reaction to rituximab No   ?Prior documented grade 1-2 infusion reaction to rituximab (If YES, Pharmacist will confirm with Physician if patient is still a candidate for RIR) No   ?Previous rituximab infusion within the past 6 months Yes   ?Treatment Plan updated orders to reflect RIR Yes   ? ?Samuel Rojas does meet the criteria for Rapid Infusion Rituximab. This patient is going to be switched to rapid infusion rituximab.  ? ?Adelina Mings ?05/09/21 8:23 AM  ?

## 2021-05-09 NOTE — Patient Instructions (Signed)
[facility]  Discharge Instructions: ?Thank you for choosing [facility] to provide your oncology and hematology care.  ?If you have a lab appointment with the Cancer Center, please go directly to the Cancer Center and check in at the registration area. ? ?Wear comfortable clothing and clothing appropriate for easy access to any Portacath or PICC line.  ? ?We strive to give you quality time with your provider. You may need to reschedule your appointment if you arrive late (15 or more minutes).  Arriving late affects you and other patients whose appointments are after yours.  Also, if you miss three or more appointments without notifying the office, you may be dismissed from the clinic at the provider?s discretion.    ?  ?For prescription refill requests, have your pharmacy contact our office and allow 72 hours for refills to be completed.   ? ?Today you received the following chemotherapy and/or immunotherapy agents Rituximab    ?  ?To help prevent nausea and vomiting after your treatment, we encourage you to take your nausea medication as directed. ? ?BELOW ARE SYMPTOMS THAT SHOULD BE REPORTED IMMEDIATELY: ?*FEVER GREATER THAN 100.4 F (38 ?C) OR HIGHER ?*CHILLS OR SWEATING ?*NAUSEA AND VOMITING THAT IS NOT CONTROLLED WITH YOUR NAUSEA MEDICATION ?*UNUSUAL SHORTNESS OF BREATH ?*UNUSUAL BRUISING OR BLEEDING ?*URINARY PROBLEMS (pain or burning when urinating, or frequent urination) ?*BOWEL PROBLEMS (unusual diarrhea, constipation, pain near the anus) ?TENDERNESS IN MOUTH AND THROAT WITH OR WITHOUT PRESENCE OF ULCERS (sore throat, sores in mouth, or a toothache) ?UNUSUAL RASH, SWELLING OR PAIN  ?UNUSUAL VAGINAL DISCHARGE OR ITCHING  ? ?Items with * indicate a potential emergency and should be followed up as soon as possible or go to the Emergency Department if any problems should occur. ? ?Please show the CHEMOTHERAPY ALERT CARD or IMMUNOTHERAPY ALERT CARD at check-in to  the Emergency Department and triage nurse. ? ?Should you have questions after your visit or need to cancel or reschedule your appointment, please contact [facility]  [phone] and follow the prompts.  Office hours are 8:00 a.m. to 4:30 p.m. Monday - Friday. Please note that voicemails left after 4:00 p.m. may not be returned until the following business day.  We are closed weekends and major holidays. You have access to a nurse at all times for urgent questions. Please call the main number to the clinic [phone] and follow the prompts. ? ?For any non-urgent questions, you may also contact your provider using MyChart. We now offer e-Visits for anyone 18 and older to request care online for non-urgent symptoms. For details visit [email or web address]. ?  ?Also download the MyChart app! Go to the app store, search "MyChart", open the app, select , and log in with your MyChart username and password. ? ?Due to Covid, a mask is required upon entering the hospital/clinic. If you do not have a mask, one will be given to you upon arrival. For doctor visits, patients may have 1 support person aged 18 or older with them. For treatment visits, patients cannot have anyone with them due to current Covid guidelines and our immunocompromised population.  ?

## 2021-05-11 NOTE — Progress Notes (Signed)
?  Samuel Rojas  ?Telephone:([phone]) [phone] Fax:(336) [phone] ? ?ID: Samuel Rojas OB: 1990-03-23  MR#: [id number]  [id number] ? ?Patient Care Team: ?Center, [facility] as PCP - General (General Practice) ?Lloyd Huger, MD as Consulting Physician (Hematology and Oncology) ? ? ? ?Lloyd Huger, MD   05/11/2021 6:44 AM ? ? ? ?This encounter was created in error - please disregard. ?

## 2021-05-15 ENCOUNTER — Inpatient Hospital Stay: Payer: BC Managed Care – PPO | Admitting: Oncology

## 2021-05-15 DIAGNOSIS — D693 Immune thrombocytopenic purpura: Secondary | ICD-10-CM

## 2021-05-16 ENCOUNTER — Inpatient Hospital Stay: Payer: BC Managed Care – PPO

## 2021-05-16 ENCOUNTER — Inpatient Hospital Stay: Payer: BC Managed Care – PPO | Attending: Oncology

## 2021-05-16 VITALS — BP 111/69 | HR 52 | Temp 97.0°F | Resp 18

## 2021-05-16 DIAGNOSIS — D696 Thrombocytopenia, unspecified: Secondary | ICD-10-CM

## 2021-05-16 DIAGNOSIS — M6282 Rhabdomyolysis: Secondary | ICD-10-CM | POA: Diagnosis not present

## 2021-05-16 DIAGNOSIS — D693 Immune thrombocytopenic purpura: Secondary | ICD-10-CM

## 2021-05-16 DIAGNOSIS — Z5112 Encounter for antineoplastic immunotherapy: Secondary | ICD-10-CM | POA: Insufficient documentation

## 2021-05-16 LAB — CBC WITH DIFFERENTIAL/PLATELET
Abs Immature Granulocytes: 0.01 10*3/uL (ref 0.00–0.07)
Basophils Absolute: 0 10*3/uL (ref 0.0–0.1)
Basophils Relative: 0 %
Eosinophils Absolute: 0.1 10*3/uL (ref 0.0–0.5)
Eosinophils Relative: 2 %
HCT: 43.6 % (ref 39.0–52.0)
Hemoglobin: 14.1 g/dL (ref 13.0–17.0)
Immature Granulocytes: 0 %
Lymphocytes Relative: 20 %
Lymphs Abs: 1.2 10*3/uL (ref 0.7–4.0)
MCH: 25.4 pg — ABNORMAL LOW (ref 26.0–34.0)
MCHC: 32.3 g/dL (ref 30.0–36.0)
MCV: 78.4 fL — ABNORMAL LOW (ref 80.0–100.0)
Monocytes Absolute: 0.2 10*3/uL (ref 0.1–1.0)
Monocytes Relative: 4 %
Neutro Abs: 4.5 10*3/uL (ref 1.7–7.7)
Neutrophils Relative %: 74 %
Platelets: 58 10*3/uL — ABNORMAL LOW (ref 150–400)
RBC: 5.56 MIL/uL (ref 4.22–5.81)
RDW: 13.8 % (ref 11.5–15.5)
WBC: 6.1 10*3/uL (ref 4.0–10.5)
nRBC: 0 % (ref 0.0–0.2)

## 2021-05-16 MED ORDER — SODIUM CHLORIDE 0.9 % IV SOLN
20.0000 mg | Freq: Once | INTRAVENOUS | Status: AC
  Administered 2021-05-16: 20 mg via INTRAVENOUS
  Filled 2021-05-16: qty 20

## 2021-05-16 MED ORDER — FAMOTIDINE IN NACL 20-0.9 MG/50ML-% IV SOLN
20.0000 mg | Freq: Two times a day (BID) | INTRAVENOUS | Status: DC
  Administered 2021-05-16: 20 mg via INTRAVENOUS
  Filled 2021-05-16: qty 50

## 2021-05-16 MED ORDER — SODIUM CHLORIDE 0.9 % IV SOLN
Freq: Once | INTRAVENOUS | Status: AC
  Filled 2021-05-16: qty 250

## 2021-05-16 MED ORDER — HEPARIN SOD (PORK) LOCK FLUSH 100 UNIT/ML IV SOLN
500.0000 [IU] | Freq: Once | INTRAVENOUS | Status: AC | PRN
  Administered 2021-05-16: 500 [IU]
  Filled 2021-05-16: qty 5

## 2021-05-16 MED ORDER — SODIUM CHLORIDE 0.9 % IV SOLN
375.0000 mg/m2 | Freq: Once | INTRAVENOUS | Status: AC
  Administered 2021-05-16: 1000 mg via INTRAVENOUS
  Filled 2021-05-16: qty 100

## 2021-05-16 MED ORDER — ACETAMINOPHEN 325 MG PO TABS
650.0000 mg | ORAL_TABLET | Freq: Once | ORAL | Status: AC
  Administered 2021-05-16: 650 mg via ORAL
  Filled 2021-05-16: qty 2

## 2021-05-16 MED ORDER — DIPHENHYDRAMINE HCL 50 MG/ML IJ SOLN
50.0000 mg | Freq: Once | INTRAMUSCULAR | Status: AC
  Administered 2021-05-16: 50 mg via INTRAVENOUS
  Filled 2021-05-16: qty 1

## 2021-05-16 NOTE — Patient Instructions (Signed)
[facility]  Discharge Instructions: °Thank you for choosing [facility] to provide your oncology and hematology care.  ° °If you have a lab appointment with the Cancer Center, please go directly to the Cancer Center and check in at the registration area. °  °Wear comfortable clothing and clothing appropriate for easy access to any Portacath or PICC line.  ° °We strive to give you quality time with your provider. You may need to reschedule your appointment if you arrive late (15 or more minutes).  Arriving late affects you and other patients whose appointments are after yours.  Also, if you miss three or more appointments without notifying the office, you may be dismissed from the clinic at the provider’s discretion.    °  °For prescription refill requests, have your pharmacy contact our office and allow 72 hours for refills to be completed.   ° °Today you received the following chemotherapy and/or immunotherapy agents     °  °To help prevent nausea and vomiting after your treatment, we encourage you to take your nausea medication as directed. ° °BELOW ARE SYMPTOMS THAT SHOULD BE REPORTED IMMEDIATELY: °*FEVER GREATER THAN 100.4 F (38 °C) OR HIGHER °*CHILLS OR SWEATING °*NAUSEA AND VOMITING THAT IS NOT CONTROLLED WITH YOUR NAUSEA MEDICATION °*UNUSUAL SHORTNESS OF BREATH °*UNUSUAL BRUISING OR BLEEDING °*URINARY PROBLEMS (pain or burning when urinating, or frequent urination) °*BOWEL PROBLEMS (unusual diarrhea, constipation, pain near the anus) °TENDERNESS IN MOUTH AND THROAT WITH OR WITHOUT PRESENCE OF ULCERS (sore throat, sores in mouth, or a toothache) °UNUSUAL RASH, SWELLING OR PAIN  °UNUSUAL VAGINAL DISCHARGE OR ITCHING  ° °Items with * indicate a potential emergency and should be followed up as soon as possible or go to the Emergency Department if any problems should occur. ° °Please show the CHEMOTHERAPY ALERT CARD or IMMUNOTHERAPY ALERT CARD at check-in to the  Emergency Department and triage nurse. ° °Should you have questions after your visit or need to cancel or reschedule your appointment, please contact [facility]  Dept: [phone]  and follow the prompts.  Office hours are 8:00 a.m. to 4:30 p.m. Monday - Friday. Please note that voicemails left after 4:00 p.m. may not be returned until the following business day.  We are closed weekends and major holidays. You have access to a nurse at all times for urgent questions. Please call the main number to the clinic Dept: [phone] and follow the prompts. ° ° °For any non-urgent questions, you may also contact your provider using MyChart. We now offer e-Visits for anyone 18 and older to request care online for non-urgent symptoms. For details visit [email or web address]. °  °Also download the MyChart app! Go to the app store, search "MyChart", open the app, select [facility], and log in with your MyChart username and password. ° °Due to Covid, a mask is required upon entering the hospital/clinic. If you do not have a mask, one will be given to you upon arrival. For doctor visits, patients may have 1 support person aged 18 or older with them. For treatment visits, patients cannot have anyone with them due to current Covid guidelines and our immunocompromised population.  ° °

## 2021-05-23 ENCOUNTER — Inpatient Hospital Stay: Payer: BC Managed Care – PPO

## 2021-05-23 VITALS — BP 129/82 | HR 62 | Temp 97.4°F | Resp 18 | Ht 66.0 in | Wt 365.3 lb

## 2021-05-23 DIAGNOSIS — Z5112 Encounter for antineoplastic immunotherapy: Secondary | ICD-10-CM | POA: Diagnosis not present

## 2021-05-23 DIAGNOSIS — D696 Thrombocytopenia, unspecified: Secondary | ICD-10-CM

## 2021-05-23 DIAGNOSIS — D693 Immune thrombocytopenic purpura: Secondary | ICD-10-CM

## 2021-05-23 LAB — CBC WITH DIFFERENTIAL/PLATELET
Abs Immature Granulocytes: 0.02 10*3/uL (ref 0.00–0.07)
Basophils Absolute: 0 10*3/uL (ref 0.0–0.1)
Basophils Relative: 0 %
Eosinophils Absolute: 0.2 10*3/uL (ref 0.0–0.5)
Eosinophils Relative: 3 %
HCT: 40.9 % (ref 39.0–52.0)
Hemoglobin: 13.4 g/dL (ref 13.0–17.0)
Immature Granulocytes: 0 %
Lymphocytes Relative: 24 %
Lymphs Abs: 1.4 10*3/uL (ref 0.7–4.0)
MCH: 25.7 pg — ABNORMAL LOW (ref 26.0–34.0)
MCHC: 32.8 g/dL (ref 30.0–36.0)
MCV: 78.4 fL — ABNORMAL LOW (ref 80.0–100.0)
Monocytes Absolute: 0.3 10*3/uL (ref 0.1–1.0)
Monocytes Relative: 5 %
Neutro Abs: 3.7 10*3/uL (ref 1.7–7.7)
Neutrophils Relative %: 68 %
Platelets: 38 10*3/uL — ABNORMAL LOW (ref 150–400)
RBC: 5.22 MIL/uL (ref 4.22–5.81)
RDW: 13.8 % (ref 11.5–15.5)
WBC: 5.6 10*3/uL (ref 4.0–10.5)
nRBC: 0 % (ref 0.0–0.2)

## 2021-05-23 MED ORDER — DIPHENHYDRAMINE HCL 50 MG/ML IJ SOLN
50.0000 mg | Freq: Once | INTRAMUSCULAR | Status: AC
  Administered 2021-05-23: 50 mg via INTRAVENOUS
  Filled 2021-05-23: qty 1

## 2021-05-23 MED ORDER — ACETAMINOPHEN 325 MG PO TABS
650.0000 mg | ORAL_TABLET | Freq: Once | ORAL | Status: AC
  Administered 2021-05-23: 650 mg via ORAL
  Filled 2021-05-23: qty 2

## 2021-05-23 MED ORDER — SODIUM CHLORIDE 0.9 % IV SOLN
Freq: Once | INTRAVENOUS | Status: AC
  Filled 2021-05-23: qty 250

## 2021-05-23 MED ORDER — SODIUM CHLORIDE 0.9 % IV SOLN
20.0000 mg | Freq: Once | INTRAVENOUS | Status: AC
  Administered 2021-05-23: 20 mg via INTRAVENOUS
  Filled 2021-05-23: qty 20

## 2021-05-23 MED ORDER — SODIUM CHLORIDE 0.9 % IV SOLN
375.0000 mg/m2 | Freq: Once | INTRAVENOUS | Status: AC
  Administered 2021-05-23: 1000 mg via INTRAVENOUS
  Filled 2021-05-23: qty 100

## 2021-05-23 MED ORDER — FAMOTIDINE IN NACL 20-0.9 MG/50ML-% IV SOLN
20.0000 mg | Freq: Two times a day (BID) | INTRAVENOUS | Status: DC
  Administered 2021-05-23: 20 mg via INTRAVENOUS
  Filled 2021-05-23: qty 50

## 2021-05-23 MED ORDER — HEPARIN SOD (PORK) LOCK FLUSH 100 UNIT/ML IV SOLN
500.0000 [IU] | Freq: Once | INTRAVENOUS | Status: DC | PRN
  Administered 2021-05-23: 500 [IU]
  Filled 2021-05-23: qty 5

## 2021-05-23 NOTE — Progress Notes (Addendum)
Per Dr Orlie Dakin ok to prceeed with plts of 38.  Also clarified that he was ok with Heparin flushes with ITP, he said "yeah he's fine" ?

## 2021-05-23 NOTE — Addendum Note (Signed)
Addended by: Jannifer Rodney on: 05/23/2021 10:47 AM ? ? Modules accepted: Orders ? ?

## 2021-05-23 NOTE — Patient Instructions (Signed)
[facility] [place]  Discharge Instructions: ?Thank you for choosing [facility] to provide your oncology and hematology care.  ?If you have a lab appointment with the Cancer Center, please go directly to the Cancer Center and check in at the registration area. ? ?Wear comfortable clothing and clothing appropriate for easy access to any Portacath or PICC line.  ? ?We strive to give you quality time with your provider. You may need to reschedule your appointment if you arrive late (15 or more minutes).  Arriving late affects you and other patients whose appointments are after yours.  Also, if you miss three or more appointments without notifying the office, you may be dismissed from the clinic at the provider?s discretion.    ?  ?For prescription refill requests, have your pharmacy contact our office and allow 72 hours for refills to be completed.   ? ?Today you received the following chemotherapy and/or immunotherapy agents RUXIENCE    ?  ?To help prevent nausea and vomiting after your treatment, we encourage you to take your nausea medication as directed. ? ?BELOW ARE SYMPTOMS THAT SHOULD BE REPORTED IMMEDIATELY: ?*FEVER GREATER THAN 100.4 F (38 ?C) OR HIGHER ?*CHILLS OR SWEATING ?*NAUSEA AND VOMITING THAT IS NOT CONTROLLED WITH YOUR NAUSEA MEDICATION ?*UNUSUAL SHORTNESS OF BREATH ?*UNUSUAL BRUISING OR BLEEDING ?*URINARY PROBLEMS (pain or burning when urinating, or frequent urination) ?*BOWEL PROBLEMS (unusual diarrhea, constipation, pain near the anus) ?TENDERNESS IN MOUTH AND THROAT WITH OR WITHOUT PRESENCE OF ULCERS (sore throat, sores in mouth, or a toothache) ?UNUSUAL RASH, SWELLING OR PAIN  ?UNUSUAL VAGINAL DISCHARGE OR ITCHING  ? ?Items with * indicate a potential emergency and should be followed up as soon as possible or go to the Emergency Department if any problems should occur. ? ?Please show the CHEMOTHERAPY ALERT CARD or IMMUNOTHERAPY ALERT CARD at check-in to  the Emergency Department and triage nurse. ? ?Should you have questions after your visit or need to cancel or reschedule your appointment, please contact [facility] [place]  [phone] and follow the prompts.  Office hours are 8:00 a.m. to 4:30 p.m. Monday - Friday. Please note that voicemails left after 4:00 p.m. may not be returned until the following business day.  We are closed weekends and major holidays. You have access to a nurse at all times for urgent questions. Please call the main number to the clinic [phone] and follow the prompts. ? ?For any non-urgent questions, you may also contact your provider using MyChart. We now offer e-Visits for anyone 818 and older to request care online for non-urgent symptoms. For details visit [email or web address]. ?  ?Also download the MyChart app! Go to the app store, search "MyChart", open the app, select [facility], and log in with your MyChart username and password. ? ?Due to Covid, a mask is required upon entering the hospital/clinic. If you do not have a mask, one will be given to you upon arrival. For doctor visits, patients may have 1 support person aged 29 or older with them. For treatment visits, patients cannot have anyone with them due to current Covid guidelines and our immunocompromised population.  ? ?Rituximab Injection ?What is this medication? ?RITUXIMAB (ri TUX i mab) is a monoclonal antibody. It is used to treat certain types of cancer like non-Hodgkin lymphoma and chronic lymphocytic leukemia. It is also used to treat rheumatoid arthritis, granulomatosis with polyangiitis, microscopic polyangiitis, and pemphigus vulgaris. ?This medicine may be used for other purposes;  ask your health care provider or pharmacist if you have questions. ?COMMON BRAND NAME(S): RIABNI, Rituxan, RUXIENCE, truxima ?What should I tell my care team before I take this medication? ?They need to know if you have any of these conditions: ?chest  pain ?heart disease ?infection especially a viral infection such as chickenpox, cold sores, hepatitis B, or herpes ?immune system problems ?irregular heartbeat or rhythm ?kidney disease ?low blood counts (white cells, platelets, or red cells) ?lung disease ?recent or upcoming vaccine ?an unusual or allergic reaction to rituximab, other medicines, foods, dyes, or preservatives ?pregnant or trying to get pregnant ?breast-feeding ?How should I use this medication? ?This medicine is injected into a vein. It is given by a health care provider in a hospital or clinic setting. ?A special MedGuide will be given to you before each treatment. Be sure to read this information carefully each time. ?Talk to your health care provider about the use of this medicine in children. While this drug may be prescribed for children as young as 6 months for selected conditions, precautions do apply. ?Overdosage: If you think you have taken too much of this medicine contact a poison control center or emergency room at once. ?NOTE: This medicine is only for you. Do not share this medicine with others. ?What if I miss a dose? ?Keep appointments for follow-up doses. It is important not to miss your dose. Call your health care provider if you are unable to keep an appointment. ?What may interact with this medication? ?Do not take this medicine with any of the following medicines: ?live vaccines ?This medicine may also interact with the following medicines: ?cisplatin ?This list may not describe all possible interactions. Give your health care provider a list of all the medicines, herbs, non-prescription drugs, or dietary supplements you use. Also tell them if you smoke, drink alcohol, or use illegal drugs. Some items may interact with your medicine. ?What should I watch for while using this medication? ?Your condition will be monitored carefully while you are receiving this medicine. You may need blood work done while you are taking this  medicine. ?This medicine can cause serious infusion reactions. To reduce the risk your health care provider may give you other medicines to take before receiving this one. Be sure to follow the directions from your health care provider. ?This medicine may increase your risk of getting an infection. Call your health care provider for advice if you get a fever, chills, sore throat, or other symptoms of a cold or flu. Do not treat yourself. Try to avoid being around people who are sick. ?Call your health care provider if you are around anyone with measles, chickenpox, or if you develop sores or blisters that do not heal properly. ?Avoid taking medicines that contain aspirin, acetaminophen, ibuprofen, naproxen, or ketoprofen unless instructed by your health care provider. These medicines may hide a fever. ?This medicine may cause serious skin reactions. They can happen weeks to months after starting the medicine. Contact your health care provider right away if you notice fevers or flu-like symptoms with a rash. The rash may be red or purple and then turn into blisters or peeling of the skin. Or, you might notice a red rash with swelling of the face, lips or lymph nodes in your neck or under your arms. ?In some patients, this medicine may cause a serious brain infection that may cause death. If you have any problems seeing, thinking, speaking, walking, or standing, tell your healthcare professional right away. If  you cannot reach your healthcare professional, urgently seek other source of medical care. ?Do not become pregnant while taking this medicine or for at least 12 months after stopping it. Women should inform their health care provider if they wish to become pregnant or think they might be pregnant. There is potential for serious harm to an unborn child. Talk to your health care provider for more information. Women should use a reliable form of birth control while taking this medicine and for 12 months after  stopping it. Do not breast-feed while taking this medicine or for at least 6 months after stopping it. ?What side effects may I notice from receiving this medication? ?Side effects that you should report to your he

## 2021-07-22 NOTE — Progress Notes (Signed)
It is also curious to me, if things are so bad why does not she have a lawyer? [place]  [phone] [phone]  ID: Samuel Rojas OB: August 06, 1990  MR#: [id number]  XMI#:[id number]  Patient Care Team: [facility] [place] as PCP - General (General Practice) Lloyd Huger, MD as Consulting Physician (Hematology and Oncology)   CHIEF COMPLAINT: ITP, rhabdomyolysis.  INTERVAL HISTORY: Patient returns to clinic today for repeat laboratory work and further evaluation.  He currently feels well and is asymptomatic.  He does not complain of any weakness or fatigue.  He continues to feel well and remains asymptomatic.  He continues to be active and work full-time.  He denies any easy bleeding or bruising.  He has no neurologic complaints or recent seizures. He denies any recent fevers or illnesses. He has no chest pain, shortness of breath, cough, or hemoptysis.  He denies any nausea, vomiting, constipation, or diarrhea. He has no urinary complaints.  Patient offers no specific complaints today.  REVIEW OF SYSTEMS:   Review of Systems  Constitutional: Negative.  Negative for fever, malaise/fatigue and weight loss.  HENT: Negative.  Negative for nosebleeds.   Respiratory: Negative.  Negative for cough, hemoptysis and shortness of breath.   Cardiovascular: Negative.  Negative for chest pain and leg swelling.  Gastrointestinal: Negative.  Negative for abdominal pain, blood in stool and melena.  Genitourinary: Negative.  Negative for hematuria.  Musculoskeletal: Negative.  Negative for back pain and myalgias.  Skin: Negative.  Negative for rash.  Neurological: Negative.  Negative for sensory change, focal weakness and weakness.  Endo/Heme/Allergies:  Does not bruise/bleed easily.  Psychiatric/Behavioral: Negative.  The patient is not nervous/anxious.     As per HPI. Otherwise, a complete review of systems is negative.  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[place])     PAST SURGICAL HISTORY: Past Surgical History:  Procedure Laterality Date   PORTA CATH INSERTION N/A 09/20/2017   Procedure: PORTA CATH INSERTION;  Surgeon: Algernon Huxley, MD;  Location: [facility];  Service: Cardiovascular;  Laterality: N/A;   TONSILLECTOMY      FAMILY HISTORY: Family History  Problem Relation Age of Onset   Hypertension Mother    Diabetes Mother        ADVANCED DIRECTIVES:    HEALTH MAINTENANCE: Social History   Tobacco Use   Smoking status: Never   Smokeless tobacco: Never  Vaping Use   Vaping Use: Never used  Substance Use Topics   Alcohol use: No   Drug use: No     Colonoscopy:  PAP:  Bone density:  Lipid panel:  Allergies  Allergen Reactions   Augmentin [Amoxicillin-Pot Clavulanate] Hives   Sulfamethoxazole-Trimethoprim     Dehydration, confusion , high BP    Current Outpatient Medications  Medication Sig Dispense Refill   lamoTRIgine (LAMICTAL PO) Take 2 capsules by mouth in the morning and at bedtime. (Patient not taking: Reported on 07/24/2021)     predniSONE (DELTASONE) 50 MG tablet Take 2 tablets (100 mg) daily for 5 days. (Patient not taking: Reported on 03/14/2020) 10 tablet 0   No current facility-administered medications for this visit.   Facility-Administered Medications Ordered in Other Visits  Medication Dose Route Frequency Provider Last Rate Last Admin   famotidine (PEPCID) IVPB 20 mg premix  20 mg Intravenous Q12H Lloyd Huger, MD  Stopped at 10/01/17 1001   heparin lock flush 100 unit/mL  500 Units Intracatheter Once Lloyd Huger, MD       sodium chloride flush (NS) 0.9 % injection 10 mL  10 mL Intracatheter PRN Lloyd Huger, MD   10 mL at 10/01/17 0855   sodium chloride flush (NS) 0.9 % injection 10 mL  10 mL Intravenous PRN Lloyd Huger, MD   10 mL  at 02/27/20 1450    OBJECTIVE: Vitals:   07/24/21 1007  BP: 133/79  Pulse: 97  Resp: 18  Temp: (!) 97.2 F (36.2 C)  SpO2: 100%     Body mass index is 60.78 kg/m.    ECOG FS:0 - Asymptomatic  General: Well-developed, well-nourished, no acute distress. Eyes: Pink conjunctiva, anicteric sclera. HEENT: Normocephalic, moist mucous membranes. Lungs: No audible wheezing or coughing. Heart: Regular rate and rhythm. Abdomen: Soft, nontender, no obvious distention. Musculoskeletal: No edema, cyanosis, or clubbing. Neuro: Alert, answering all questions appropriately. Cranial nerves grossly intact. Skin: No rashes or petechiae noted. Psych: Normal affect.  LAB RESULTS:  Lab Results  Component Value Date   NA 140 07/30/2020   K 3.2 (L) 07/30/2020   CL 106 07/30/2020   CO2 27 07/30/2020   GLUCOSE 119 (H) 07/30/2020   BUN 15 07/30/2020   CREATININE 0.66 07/30/2020   CALCIUM 8.8 (L) 07/30/2020   PROT 7.3 07/30/2020   ALBUMIN 4.0 07/30/2020   AST 23 07/30/2020   ALT 26 07/30/2020   ALKPHOS 77 07/30/2020   BILITOT 0.8 07/30/2020   GFRNONAA >60 07/30/2020   GFRAA >60 08/26/2019    Lab Results  Component Value Date   WBC 5.8 07/24/2021   NEUTROABS 4.4 07/24/2021   HGB 13.4 07/24/2021   HCT 41.7 07/24/2021   MCV 78.7 (L) 07/24/2021   PLT 71 (L) 07/24/2021   Lab Results  Component Value Date   IRON 50 09/09/2015   TIBC 320 09/09/2015   IRONPCTSAT 16 (L) 09/09/2015   Lab Results  Component Value Date   FERRITIN 75 09/09/2015     STUDIES: No results found.  TREATMENT HISTORY: Weekly Rituxan x4 completed on October 08, 2017 Weekly Rituxan x4 completed on September 09, 2018 Weekly Rituxan x4 completed on  March 22, 2020 Weekly Rituxan x4 completed on May 23, 2021  ASSESSMENT: ITP, rhabdomyolysis.  PLAN:     1.  ITP: Previously, patient received 5 days of 100 mg prednisone with an excellent response to his platelets increasing to greater than 200.   Unfortunately, response was not durable. 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  Splenic ultrasound did not reveal any evidence of splenomegaly.  Bone marrow biopsy was not completed.  Patient has had multiple rounds of weekly Rituxan x4.  See above for patient treatment history.  Typically he has a delayed increase in his platelet count after receiving treatment.  Patient's platelet count has improved to 71.  No intervention is needed at this time.  Return to clinic in 3 months with laboratory work and video assisted telemedicine visit.   2.  Poor venous access: Patient now has a port.  Continue port flushes every 6-8 weeks. 3.  Nontraumatic rhabdomyolysis: Resolved. Possibly related to recently diagnosed "nocturnal seizures" brought on by sleep apnea.  I spent a total of 20 minutes reviewing chart data, face-to-face evaluation with the patient, counseling and coordination of care as detailed above.   Patient expressed understanding and was in agreement  with this plan. He also understands that He can call clinic at any time with any questions, concerns, or complaints.    Lloyd Huger, MD   07/25/2021 9:13 AM

## 2021-07-24 ENCOUNTER — Inpatient Hospital Stay: Payer: BC Managed Care – PPO | Attending: Oncology

## 2021-07-24 ENCOUNTER — Inpatient Hospital Stay (HOSPITAL_BASED_OUTPATIENT_CLINIC_OR_DEPARTMENT_OTHER): Payer: BC Managed Care – PPO | Admitting: Oncology

## 2021-07-24 ENCOUNTER — Encounter: Payer: Self-pay | Admitting: Oncology

## 2021-07-24 VITALS — BP 133/79 | HR 97 | Temp 97.2°F | Resp 18 | Ht 66.0 in | Wt 376.6 lb

## 2021-07-24 DIAGNOSIS — Z95828 Presence of other vascular implants and grafts: Secondary | ICD-10-CM

## 2021-07-24 DIAGNOSIS — M6282 Rhabdomyolysis: Secondary | ICD-10-CM | POA: Diagnosis not present

## 2021-07-24 DIAGNOSIS — D693 Immune thrombocytopenic purpura: Secondary | ICD-10-CM | POA: Diagnosis present

## 2021-07-24 DIAGNOSIS — Z452 Encounter for adjustment and management of vascular access device: Secondary | ICD-10-CM | POA: Insufficient documentation

## 2021-07-24 LAB — CBC WITH DIFFERENTIAL/PLATELET
Abs Immature Granulocytes: 0.02 10*3/uL (ref 0.00–0.07)
Basophils Absolute: 0 10*3/uL (ref 0.0–0.1)
Basophils Relative: 0 %
Eosinophils Absolute: 0.1 10*3/uL (ref 0.0–0.5)
Eosinophils Relative: 2 %
HCT: 41.7 % (ref 39.0–52.0)
Hemoglobin: 13.4 g/dL (ref 13.0–17.0)
Immature Granulocytes: 0 %
Lymphocytes Relative: 17 %
Lymphs Abs: 1 10*3/uL (ref 0.7–4.0)
MCH: 25.3 pg — ABNORMAL LOW (ref 26.0–34.0)
MCHC: 32.1 g/dL (ref 30.0–36.0)
MCV: 78.7 fL — ABNORMAL LOW (ref 80.0–100.0)
Monocytes Absolute: 0.3 10*3/uL (ref 0.1–1.0)
Monocytes Relative: 5 %
Neutro Abs: 4.4 10*3/uL (ref 1.7–7.7)
Neutrophils Relative %: 76 %
Platelets: 71 10*3/uL — ABNORMAL LOW (ref 150–400)
RBC: 5.3 MIL/uL (ref 4.22–5.81)
RDW: 14.2 % (ref 11.5–15.5)
WBC: 5.8 10*3/uL (ref 4.0–10.5)
nRBC: 0 % (ref 0.0–0.2)

## 2021-07-24 MED ORDER — HEPARIN SOD (PORK) LOCK FLUSH 100 UNIT/ML IV SOLN
500.0000 [IU] | Freq: Once | INTRAVENOUS | Status: DC
  Filled 2021-07-24: qty 5

## 2021-07-24 MED ORDER — SODIUM CHLORIDE 0.9% FLUSH
10.0000 mL | Freq: Once | INTRAVENOUS | Status: AC
  Administered 2021-07-24: 10 mL via INTRAVENOUS
  Filled 2021-07-24: qty 10

## 2021-07-24 MED ORDER — SODIUM CHLORIDE 0.9% FLUSH
10.0000 mL | INTRAVENOUS | Status: DC | PRN
  Filled 2021-07-24: qty 10

## 2021-07-24 MED ORDER — HEPARIN SOD (PORK) LOCK FLUSH 100 UNIT/ML IV SOLN
500.0000 [IU] | Freq: Once | INTRAVENOUS | Status: AC
  Administered 2021-07-24: 500 [IU] via INTRAVENOUS
  Filled 2021-07-24: qty 5

## 2021-07-25 ENCOUNTER — Encounter: Payer: Self-pay | Admitting: Oncology

## 2021-09-19 ENCOUNTER — Inpatient Hospital Stay: Payer: BC Managed Care – PPO | Attending: Oncology

## 2021-09-19 DIAGNOSIS — Z452 Encounter for adjustment and management of vascular access device: Secondary | ICD-10-CM | POA: Insufficient documentation

## 2021-09-19 DIAGNOSIS — D693 Immune thrombocytopenic purpura: Secondary | ICD-10-CM | POA: Diagnosis present

## 2021-09-19 MED ORDER — SODIUM CHLORIDE 0.9% FLUSH
10.0000 mL | Freq: Once | INTRAVENOUS | Status: AC
  Administered 2021-09-19: 10 mL via INTRAVENOUS
  Filled 2021-09-19: qty 10

## 2021-09-19 MED ORDER — HEPARIN SOD (PORK) LOCK FLUSH 100 UNIT/ML IV SOLN
500.0000 [IU] | Freq: Once | INTRAVENOUS | Status: AC
  Administered 2021-09-19: 500 [IU] via INTRAVENOUS
  Filled 2021-09-19: qty 5

## 2021-10-22 NOTE — Progress Notes (Signed)
It is also curious to me, if things are so bad why does not she have a lawyer? [place]  Telephone:(336) [phone] Fax:(336) [phone]  ID: Samuel Rojas OB: 10-Jul-1990  MR#: [id number]  TOI#:[id number]  Patient Care Team: [facility] [place] as PCP - General (General Practice) Lloyd Huger, MD as Consulting Physician (Hematology and Oncology)   I connected with Samuel Rojas on 10/22/21 at  3:30 PM EDT by video enabled telemedicine visit and verified that I am speaking with the correct person using two identifiers.   I discussed the limitations, risks, security and privacy concerns of performing an evaluation and management service by telemedicine and the availability of in-person appointments. I also discussed with the patient that there may be a patient responsible charge related to this service. The patient expressed understanding and agreed to proceed.   Other persons participating in the visit and their role in the encounter: Patient, MD.  Patient's location: Home. Provider's location: Clinic.  CHIEF COMPLAINT: ITP, rhabdomyolysis.  INTERVAL HISTORY: Patient agreed to video assisted telemedicine visit for further evaluation.  He reports she got too busy at work and forgot about his lab appointment earlier this morning.  He currently feels well and is asymptomatic.  He continues to be active and work full-time.  He denies any easy bleeding or bruising.  He has no neurologic complaints or recent seizures. He denies any recent fevers or illnesses. He has no chest pain, shortness of breath, cough, or hemoptysis.  He denies any nausea, vomiting, constipation, or diarrhea. He has no urinary complaints.  Patient is no specific complaints today.  REVIEW OF SYSTEMS:   Review of Systems  Constitutional: Negative.  Negative for fever, malaise/fatigue and weight loss.  HENT: Negative.  Negative for nosebleeds.   Respiratory: Negative.   Negative for cough, hemoptysis and shortness of breath.   Cardiovascular: Negative.  Negative for chest pain and leg swelling.  Gastrointestinal: Negative.  Negative for abdominal pain, blood in stool and melena.  Genitourinary: Negative.  Negative for hematuria.  Musculoskeletal: Negative.  Negative for back pain and myalgias.  Skin: Negative.  Negative for rash.  Neurological: Negative.  Negative for sensory change, focal weakness and weakness.  Endo/Heme/Allergies:  Does not bruise/bleed easily.  Psychiatric/Behavioral: Negative.  The patient is not nervous/anxious.     As per HPI. Otherwise, a complete review of systems is negative.  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[place])     PAST SURGICAL HISTORY: Past Surgical History:  Procedure Laterality Date   PORTA CATH INSERTION N/A 09/20/2017   Procedure: PORTA CATH INSERTION;  Surgeon: Algernon Huxley, MD;  Location: [facility];  Service: Cardiovascular;  Laterality: N/A;   TONSILLECTOMY      FAMILY HISTORY: Family History  Problem Relation Age of Onset   Hypertension Mother    Diabetes Mother        ADVANCED DIRECTIVES:    HEALTH MAINTENANCE: Social History   Tobacco Use   Smoking status: Never   Smokeless tobacco: Never  Vaping Use   Vaping Use: Never used  Substance Use Topics   Alcohol use: No   Drug use: No     Colonoscopy:  PAP:  Bone density:  Lipid panel:  Allergies  Allergen Reactions   Augmentin [Amoxicillin-Pot Clavulanate] Hives   Sulfamethoxazole-Trimethoprim     Dehydration, confusion , high BP  Current Outpatient Medications  Medication Sig Dispense Refill   lamoTRIgine (LAMICTAL PO) Take 2 capsules by mouth in the morning and at bedtime. (Patient not taking: Reported on 07/24/2021)     predniSONE (DELTASONE) 50 MG tablet Take 2 tablets (100 mg) daily for 5 days. (Patient not taking:  Reported on 03/14/2020) 10 tablet 0   No current facility-administered medications for this visit.   Facility-Administered Medications Ordered in Other Visits  Medication Dose Route Frequency Provider Last Rate Last Admin   famotidine (PEPCID) IVPB 20 mg premix  20 mg Intravenous Q12H Lloyd Huger, MD   Stopped at 10/01/17 1001   heparin lock flush 100 unit/mL  500 Units Intracatheter Once Lloyd Huger, MD       sodium chloride flush (NS) 0.9 % injection 10 mL  10 mL Intracatheter PRN Lloyd Huger, MD   10 mL at 10/01/17 0855   sodium chloride flush (NS) 0.9 % injection 10 mL  10 mL Intravenous PRN Lloyd Huger, MD   10 mL at 02/27/20 1450    OBJECTIVE: There were no vitals filed for this visit.    There is no height or weight on file to calculate BMI.    ECOG FS:0 - Asymptomatic  General: Well-developed, well-nourished, no acute distress. HEENT: Normocephalic. Neuro: Alert, answering all questions appropriately. Cranial nerves grossly intact. Psych: Normal affect.  LAB RESULTS:  Lab Results  Component Value Date   NA 140 07/30/2020   K 3.2 (L) 07/30/2020   CL 106 07/30/2020   CO2 27 07/30/2020   GLUCOSE 119 (H) 07/30/2020   BUN 15 07/30/2020   CREATININE 0.66 07/30/2020   CALCIUM 8.8 (L) 07/30/2020   PROT 7.3 07/30/2020   ALBUMIN 4.0 07/30/2020   AST 23 07/30/2020   ALT 26 07/30/2020   ALKPHOS 77 07/30/2020   BILITOT 0.8 07/30/2020   GFRNONAA >60 07/30/2020   GFRAA >60 08/26/2019    Lab Results  Component Value Date   WBC 5.8 07/24/2021   NEUTROABS 4.4 07/24/2021   HGB 13.4 07/24/2021   HCT 41.7 07/24/2021   MCV 78.7 (L) 07/24/2021   PLT 71 (L) 07/24/2021   Lab Results  Component Value Date   IRON 50 09/09/2015   TIBC 320 09/09/2015   IRONPCTSAT 16 (L) 09/09/2015   Lab Results  Component Value Date   FERRITIN 75 09/09/2015     STUDIES: No results found.  TREATMENT HISTORY: Weekly Rituxan x4 completed on October 08, 2017 Weekly Rituxan x4 completed on September 09, 2018 Weekly Rituxan x4 completed on  March 22, 2020 Weekly Rituxan x4 completed on May 23, 2021  ASSESSMENT: ITP, rhabdomyolysis.  PLAN:     1.  ITP: Previously, patient received 5 days of 100 mg prednisone with an excellent response to his platelets increasing to greater than 200.  Unfortunately, response was not durable. 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  Splenic ultrasound did not reveal any evidence of splenomegaly.  Bone marrow biopsy was not completed.  Patient has had multiple rounds of weekly Rituxan x4.  See above for patient treatment history.  Typically he has a delayed increase in his platelet count after receiving treatment.  Patient's most recent platelet count was improved to 71.  Repeat laboratory work tomorrow morning.  Return to clinic in 3 months with repeat laboratory can video-assisted telemedicine visit.    2.  Poor venous access: Patient now has a port.  Continue port  flushes every 6-8 weeks.  Patient can also receive lab work along with his port flush. 3.  Nontraumatic rhabdomyolysis: Resolved. Possibly related to recently diagnosed "nocturnal seizures" brought on by sleep apnea.   I provided 20 minutes of face-to-face video visit time during this encounter which included chart review, counseling, and coordination of care as documented above.    Patient expressed understanding and was in agreement with this plan. He also understands that He can call clinic at any time with any questions, concerns, or complaints.    Lloyd Huger, MD   10/22/2021 11:24 PM

## 2021-10-23 ENCOUNTER — Inpatient Hospital Stay: Payer: BC Managed Care – PPO

## 2021-10-23 ENCOUNTER — Inpatient Hospital Stay: Payer: BC Managed Care – PPO | Attending: Oncology | Admitting: Oncology

## 2021-10-23 ENCOUNTER — Ambulatory Visit: Payer: BC Managed Care – PPO | Admitting: Oncology

## 2021-10-23 ENCOUNTER — Telehealth: Payer: Self-pay

## 2021-10-23 ENCOUNTER — Encounter: Payer: Self-pay | Admitting: Oncology

## 2021-10-23 DIAGNOSIS — D693 Immune thrombocytopenic purpura: Secondary | ICD-10-CM | POA: Diagnosis not present

## 2021-10-23 MED ORDER — SODIUM CHLORIDE 0.9% FLUSH
10.0000 mL | Freq: Once | INTRAVENOUS | Status: AC
  Filled 2021-10-23: qty 10

## 2021-10-23 MED ORDER — HEPARIN SOD (PORK) LOCK FLUSH 100 UNIT/ML IV SOLN
500.0000 [IU] | Freq: Once | INTRAVENOUS | Status: AC
  Filled 2021-10-23: qty 5

## 2021-10-23 NOTE — Telephone Encounter (Signed)
Patient missed port lab appt today for video visit with Dr. Orlie Dakin. Patient is scheduled for tomorrow 9/15 at 9:45 am. Patient has been notified and verbalized understanding,

## 2021-10-24 ENCOUNTER — Inpatient Hospital Stay: Payer: BC Managed Care – PPO

## 2021-10-24 DIAGNOSIS — Z95828 Presence of other vascular implants and grafts: Secondary | ICD-10-CM

## 2021-10-24 DIAGNOSIS — D693 Immune thrombocytopenic purpura: Secondary | ICD-10-CM

## 2021-10-24 LAB — CBC WITH DIFFERENTIAL/PLATELET
Abs Immature Granulocytes: 0.01 10*3/uL (ref 0.00–0.07)
Basophils Absolute: 0 10*3/uL (ref 0.0–0.1)
Basophils Relative: 0 %
Eosinophils Absolute: 0.1 10*3/uL (ref 0.0–0.5)
Eosinophils Relative: 2 %
HCT: 41.2 % (ref 39.0–52.0)
Hemoglobin: 13.3 g/dL (ref 13.0–17.0)
Immature Granulocytes: 0 %
Lymphocytes Relative: 25 %
Lymphs Abs: 1 10*3/uL (ref 0.7–4.0)
MCH: 25.4 pg — ABNORMAL LOW (ref 26.0–34.0)
MCHC: 32.3 g/dL (ref 30.0–36.0)
MCV: 78.6 fL — ABNORMAL LOW (ref 80.0–100.0)
Monocytes Absolute: 0.2 10*3/uL (ref 0.1–1.0)
Monocytes Relative: 6 %
Neutro Abs: 2.8 10*3/uL (ref 1.7–7.7)
Neutrophils Relative %: 67 %
Platelets: 80 10*3/uL — ABNORMAL LOW (ref 150–400)
RBC: 5.24 MIL/uL (ref 4.22–5.81)
RDW: 13.4 % (ref 11.5–15.5)
WBC: 4.2 10*3/uL (ref 4.0–10.5)
nRBC: 0 % (ref 0.0–0.2)

## 2021-10-24 MED ORDER — HEPARIN SOD (PORK) LOCK FLUSH 100 UNIT/ML IV SOLN
500.0000 [IU] | Freq: Once | INTRAVENOUS | Status: AC
  Administered 2021-10-24: 500 [IU] via INTRAVENOUS
  Filled 2021-10-24: qty 5

## 2021-10-24 MED ORDER — SODIUM CHLORIDE 0.9% FLUSH
10.0000 mL | Freq: Once | INTRAVENOUS | Status: AC
  Administered 2021-10-24: 10 mL via INTRAVENOUS
  Filled 2021-10-24: qty 10

## 2021-12-18 ENCOUNTER — Inpatient Hospital Stay: Payer: BC Managed Care – PPO | Attending: Oncology

## 2021-12-18 DIAGNOSIS — Z452 Encounter for adjustment and management of vascular access device: Secondary | ICD-10-CM | POA: Diagnosis not present

## 2021-12-18 DIAGNOSIS — D693 Immune thrombocytopenic purpura: Secondary | ICD-10-CM | POA: Insufficient documentation

## 2021-12-18 DIAGNOSIS — Z95828 Presence of other vascular implants and grafts: Secondary | ICD-10-CM

## 2021-12-18 MED ORDER — SODIUM CHLORIDE 0.9% FLUSH
10.0000 mL | INTRAVENOUS | Status: AC | PRN
  Administered 2021-12-18: 10 mL via INTRAVENOUS
  Filled 2021-12-18: qty 10

## 2021-12-18 MED ORDER — HEPARIN SOD (PORK) LOCK FLUSH 100 UNIT/ML IV SOLN
500.0000 [IU] | Freq: Once | INTRAVENOUS | Status: AC
  Administered 2021-12-18: 500 [IU] via INTRAVENOUS
  Filled 2021-12-18: qty 5

## 2021-12-18 MED ORDER — HEPARIN SOD (PORK) LOCK FLUSH 100 UNIT/ML IV SOLN
INTRAVENOUS | Status: AC
  Filled 2021-12-18: qty 5

## 2021-12-18 MED ORDER — SODIUM CHLORIDE 0.9% FLUSH
10.0000 mL | Freq: Once | INTRAVENOUS | Status: AC
  Filled 2021-12-18: qty 10

## 2021-12-18 MED ORDER — HEPARIN SOD (PORK) LOCK FLUSH 100 UNIT/ML IV SOLN
500.0000 [IU] | Freq: Once | INTRAVENOUS | Status: AC
  Filled 2021-12-18: qty 5

## 2021-12-18 NOTE — Patient Instructions (Signed)

## 2022-02-05 ENCOUNTER — Inpatient Hospital Stay: Payer: BC Managed Care – PPO | Attending: Oncology

## 2022-02-05 DIAGNOSIS — D693 Immune thrombocytopenic purpura: Secondary | ICD-10-CM | POA: Diagnosis not present

## 2022-02-05 DIAGNOSIS — Z452 Encounter for adjustment and management of vascular access device: Secondary | ICD-10-CM | POA: Insufficient documentation

## 2022-02-05 DIAGNOSIS — Z95828 Presence of other vascular implants and grafts: Secondary | ICD-10-CM

## 2022-02-05 MED ORDER — SODIUM CHLORIDE 0.9% FLUSH
10.0000 mL | Freq: Once | INTRAVENOUS | Status: AC
  Administered 2022-02-05: 10 mL via INTRAVENOUS
  Filled 2022-02-05: qty 10

## 2022-02-05 MED ORDER — HEPARIN SOD (PORK) LOCK FLUSH 100 UNIT/ML IV SOLN
500.0000 [IU] | Freq: Once | INTRAVENOUS | Status: AC
  Administered 2022-02-05: 500 [IU] via INTRAVENOUS
  Filled 2022-02-05: qty 5

## 2022-03-02 ENCOUNTER — Other Ambulatory Visit: Payer: Self-pay | Admitting: Student

## 2022-03-02 DIAGNOSIS — R569 Unspecified convulsions: Secondary | ICD-10-CM

## 2022-03-13 ENCOUNTER — Telehealth: Payer: Self-pay | Admitting: *Deleted

## 2022-03-13 ENCOUNTER — Ambulatory Visit
Admission: RE | Admit: 2022-03-13 | Discharge: 2022-03-13 | Disposition: A | Discharge status: Home / Self Care | Payer: BC Managed Care – PPO | Source: Ambulatory Visit | Attending: Student | Admitting: Student

## 2022-03-13 DIAGNOSIS — R569 Unspecified convulsions: Secondary | ICD-10-CM

## 2022-03-13 NOTE — Telephone Encounter (Signed)
Kerry Dory, PA with [facility] called asking to speak with Dr Grayland Ormond regarding this patient Please return her call [phone]

## 2022-03-19 ENCOUNTER — Other Ambulatory Visit: Payer: Self-pay | Admitting: Student

## 2022-03-19 DIAGNOSIS — R569 Unspecified convulsions: Secondary | ICD-10-CM

## 2022-03-30 ENCOUNTER — Other Ambulatory Visit: Payer: Self-pay

## 2022-03-30 ENCOUNTER — Ambulatory Visit
Admission: RE | Admit: 2022-03-30 | Discharge: 2022-03-30 | Disposition: A | Discharge status: Home / Self Care | Payer: Self-pay | Source: Ambulatory Visit | Attending: Neurosurgery | Admitting: Neurosurgery

## 2022-03-30 DIAGNOSIS — Z049 Encounter for examination and observation for unspecified reason: Secondary | ICD-10-CM

## 2022-03-31 ENCOUNTER — Ambulatory Visit (INDEPENDENT_AMBULATORY_CARE_PROVIDER_SITE_OTHER): Payer: BC Managed Care – PPO | Admitting: Neurosurgery

## 2022-03-31 ENCOUNTER — Encounter: Payer: Self-pay | Admitting: Neurosurgery

## 2022-03-31 VITALS — BP 128/82 | Ht 66.0 in | Wt 376.0 lb

## 2022-03-31 DIAGNOSIS — Q046 Congenital cerebral cysts: Secondary | ICD-10-CM | POA: Diagnosis not present

## 2022-03-31 NOTE — Progress Notes (Signed)
Referring Physician:  Kerry Dory, PA-C [place],  [place] [address]  Primary Physician:  [facility] [place]  History of Present Illness: 03/31/2022 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  He denies any symptoms other than his nocturnal seizures.  He does states he had some changes in his vision but was seen by an optometrist with an explanatory cause.  Samuel Rojas has no symptoms of cervical myelopathy.  The symptoms are causing a significant impact on the patient's life.   Review of Systems:  A 10 point review of systems is negative, except for the pertinent positives and negatives detailed in the HPI.  Past Medical History: Past Medical History:  Diagnosis Date   Cellulitis    left leg   Idiopathic thrombocytopenic purpura (ITP) ([facility])     Past Surgical History: Past Surgical History:  Procedure Laterality Date   PORTA CATH INSERTION N/A 09/20/2017   Procedure: PORTA CATH INSERTION;  Surgeon: Algernon Huxley, MD;  Location: [facility];  Service: Cardiovascular;  Laterality: N/A;   TONSILLECTOMY      Allergies: Allergies as of 03/31/2022 - Review Complete 10/23/2021  Allergen Reaction Noted   Augmentin [amoxicillin-pot clavulanate] Hives 01/15/2014   Sulfamethoxazole-trimethoprim  11/13/2016    Medications: Outpatient Encounter Medications as of 03/31/2022  Medication Sig   lamoTRIgine (LAMICTAL PO) Take 2 capsules by mouth in the morning and at bedtime.   predniSONE (DELTASONE) 50 MG tablet Take 2 tablets (100 mg) daily for 5 days. (Patient not taking: Reported on 03/14/2020)   Facility-Administered Encounter Medications as of 03/31/2022  Medication   heparin lock flush 100 UNIT/ML injection   heparin lock flush 100 unit/mL   heparin lock flush 100 unit/mL   heparin  lock flush 100 unit/mL   sodium chloride flush (NS) 0.9 % injection 10 mL   sodium chloride flush (NS) 0.9 % injection 10 mL   sodium chloride flush (NS) 0.9 % injection 10 mL   sodium chloride flush (NS) 0.9 % injection 10 mL    Social History: Social History   Tobacco Use   Smoking status: Never   Smokeless tobacco: Never  Vaping Use   Vaping Use: Never used  Substance Use Topics   Alcohol use: No   Drug use: No    Family Medical History: Family History  Problem Relation Age of Onset   Hypertension Mother    Diabetes Mother     Physical Examination: Today's Vitals   03/31/22 1421  BP: 128/82  Weight: (!) 170.6 kg  Height: 5' 6"$  (1.676 m)  PainSc: 0-No pain  PainLoc: Head   Body mass index is 60.69 kg/m.   General: Patient is well developed, well nourished, calm, collected, and in no apparent distress. Attention to examination is appropriate.  Psychiatric: Patient is non-anxious.  Head:  Pupils equal, round, and reactive to light.  ENT:  Oral mucosa appears well hydrated.  Neck:   Supple.  Respiratory: Patient is breathing without any difficulty.  Extremities: No edema.  Vascular: Palpable dorsal pedal pulses.  Skin:   On exposed skin, there are no abnormal skin lesions.  NEUROLOGICAL:     Awake, alert, oriented to person, place, and time.  Speech is clear and fluent. Fund of knowledge is appropriate.   Cranial Nerves: Pupils equal round and reactive  to light.  Facial tone is symmetric.  Facial sensation is symmetric.   Negative pronator drift  Strength: Side Biceps Triceps Deltoid Interossei Grip Wrist Ext. Wrist Flex.  R 5 5 5 5 5 5 5  $ L 5 5 5 5 5 5 5   $ Side Iliopsoas Quads Hamstring PF DF EHL  R 5 5 5 5 5 5  $ L 5 5 5 5 5 5   $ Bilateral upper and lower extremity sensation is intact to light touch.    Gait is normal.   No difficulty with tandem gait.   No evidence of dysmetria noted.  Medical Decision Making  Imaging: 03/13/22 CT  head  IMPRESSION: 1. No acute findings. No intracranial hemorrhage or edema. 2. Small hyperdense nodule at the foramen of Monro, compatible with a small colloid cyst. Recommend brain MRI for confirmation. No associated hydrocephalus at this time, however, given patient's symptoms, recommend neurosurgical consultation for further workup/treatment considerations.   These results will be called to the ordering clinician or representative by the Radiologist Assistant, and communication documented in the PACS or [employer].     Electronically Signed   By: Franki Cabot M.D.   On: 03/13/2022 09:04  I have personally reviewed the images and agree with the above interpretation.  Assessment and Plan: Samuel Rojas is a pleasant 32 y.o. 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  His causes does not the cause of his seizures and we discussed this in detail.  He is not having any symptoms concerning for obstructive hydrocephalus.  We did review red flag symptoms.  I will follow-up after his MRI is completed on 04/07/2022.  He will likely need a follow-up MRI with and without contrast 6 months after this MRI to evaluate progression.  He was encouraged to call the office should he have any questions or concerns.  Thank you for involving me in the care of this patient.   I spent a total of 47 minutes in both face-to-face and non-face-to-face activities for this visit on the date of this encounter including review of outside records, review of imaging, discussion of symptoms, physical exam, documentation.   [facility]

## 2022-04-07 ENCOUNTER — Ambulatory Visit
Admission: RE | Admit: 2022-04-07 | Discharge: 2022-04-07 | Disposition: A | Discharge status: Home / Self Care | Payer: BC Managed Care – PPO | Source: Ambulatory Visit | Attending: Student | Admitting: Student

## 2022-04-07 DIAGNOSIS — R569 Unspecified convulsions: Secondary | ICD-10-CM

## 2022-04-07 MED ORDER — GADOPICLENOL 0.5 MMOL/ML IV SOLN
10.0000 mL | Freq: Once | INTRAVENOUS | Status: AC | PRN
  Administered 2022-04-07: 10 mL via INTRAVENOUS

## 2022-04-10 ENCOUNTER — Ambulatory Visit (INDEPENDENT_AMBULATORY_CARE_PROVIDER_SITE_OTHER): Payer: BC Managed Care – PPO | Admitting: Neurosurgery

## 2022-04-10 DIAGNOSIS — Q046 Congenital cerebral cysts: Secondary | ICD-10-CM

## 2022-04-10 NOTE — Progress Notes (Signed)
2 attempts made to call the patient. VM left and mychart message sent. Will plan to follow up with repeat MRI brain with and without in 6 months

## 2022-08-27 ENCOUNTER — Other Ambulatory Visit: Payer: Self-pay | Admitting: *Deleted

## 2022-08-27 DIAGNOSIS — D693 Immune thrombocytopenic purpura: Secondary | ICD-10-CM

## 2022-08-28 ENCOUNTER — Encounter: Payer: Self-pay | Admitting: Oncology

## 2022-08-28 ENCOUNTER — Inpatient Hospital Stay: Payer: BC Managed Care – PPO

## 2022-08-28 ENCOUNTER — Inpatient Hospital Stay (HOSPITAL_BASED_OUTPATIENT_CLINIC_OR_DEPARTMENT_OTHER): Payer: BC Managed Care – PPO | Admitting: Oncology

## 2022-08-28 ENCOUNTER — Inpatient Hospital Stay: Payer: BC Managed Care – PPO | Admitting: Oncology

## 2022-08-28 ENCOUNTER — Inpatient Hospital Stay: Payer: BC Managed Care – PPO | Attending: Oncology

## 2022-08-28 VITALS — BP 130/76 | HR 69 | Temp 96.9°F | Resp 18 | Ht 66.0 in | Wt 373.0 lb

## 2022-08-28 DIAGNOSIS — D693 Immune thrombocytopenic purpura: Secondary | ICD-10-CM

## 2022-08-28 DIAGNOSIS — Z95828 Presence of other vascular implants and grafts: Secondary | ICD-10-CM

## 2022-08-28 LAB — CBC WITH DIFFERENTIAL/PLATELET
Abs Immature Granulocytes: 0.01 10*3/uL (ref 0.00–0.07)
Basophils Absolute: 0 10*3/uL (ref 0.0–0.1)
Basophils Relative: 0 %
Eosinophils Absolute: 0.1 10*3/uL (ref 0.0–0.5)
Eosinophils Relative: 2 %
HCT: 42.8 % (ref 39.0–52.0)
Hemoglobin: 14 g/dL (ref 13.0–17.0)
Immature Granulocytes: 0 %
Lymphocytes Relative: 27 %
Lymphs Abs: 1.1 10*3/uL (ref 0.7–4.0)
MCH: 25.6 pg — ABNORMAL LOW (ref 26.0–34.0)
MCHC: 32.7 g/dL (ref 30.0–36.0)
MCV: 78.2 fL — ABNORMAL LOW (ref 80.0–100.0)
Monocytes Absolute: 0.3 10*3/uL (ref 0.1–1.0)
Monocytes Relative: 7 %
Neutro Abs: 2.6 10*3/uL (ref 1.7–7.7)
Neutrophils Relative %: 64 %
Platelets: 99 10*3/uL — ABNORMAL LOW (ref 150–400)
RBC: 5.47 MIL/uL (ref 4.22–5.81)
RDW: 13.9 % (ref 11.5–15.5)
WBC: 4.1 10*3/uL (ref 4.0–10.5)
nRBC: 0 % (ref 0.0–0.2)

## 2022-08-28 IMAGING — CT CT HEAD W/O CM
3 series · 15 of 47 positions shown, 18 images · non-contrast
Comparison: CT head 03/07/2013.

CLINICAL DATA: Seizure.  Nontraumatic.  Posterior headache.

EXAM:
CT HEAD WITHOUT CONTRAST
TECHNIQUE: Contiguous axial images were obtained from the base of the skull
through the vertex without intravenous contrast.

[Series 3: head wo · axial · 0.48mm/px · z∈[-154,-19]mm · 9 of 33 slices shown, 12 images]
[im 3/33  brain]
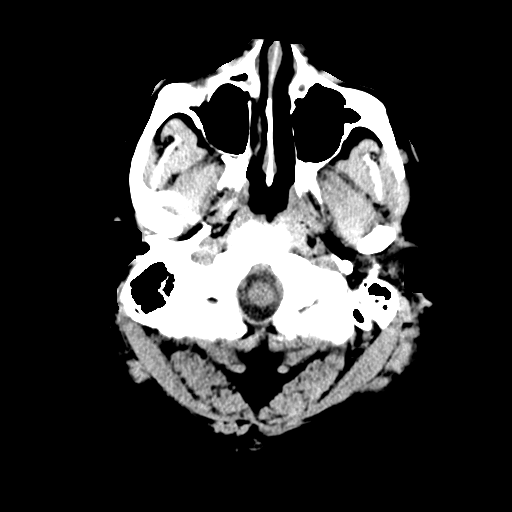
[im 3/33  bone]
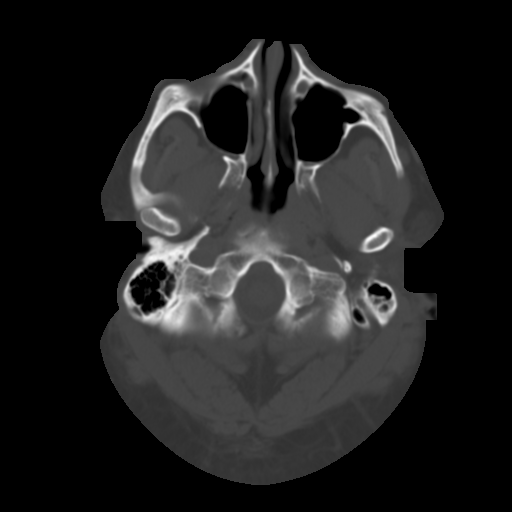
[im 6/33  brain]
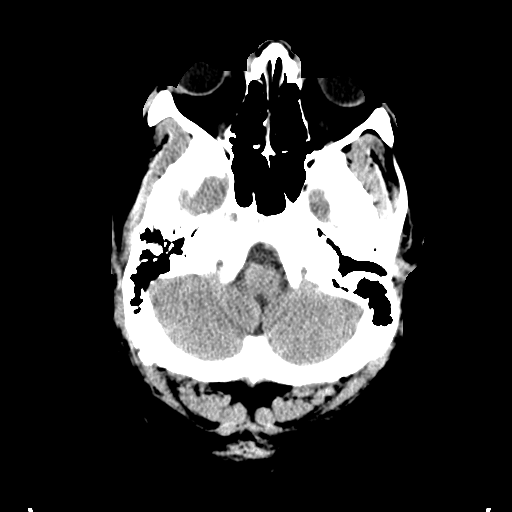
[im 9/33  brain]
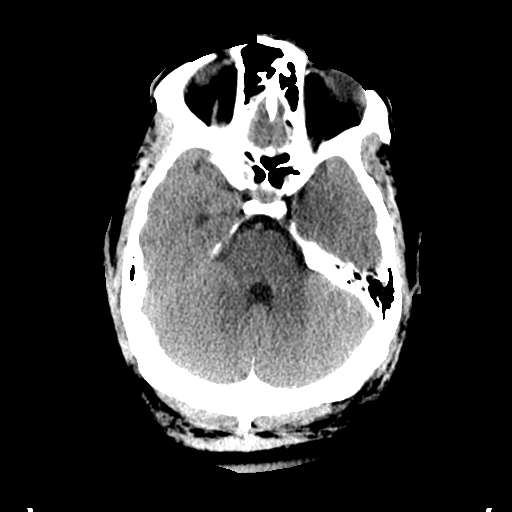
[im 13/33  brain]
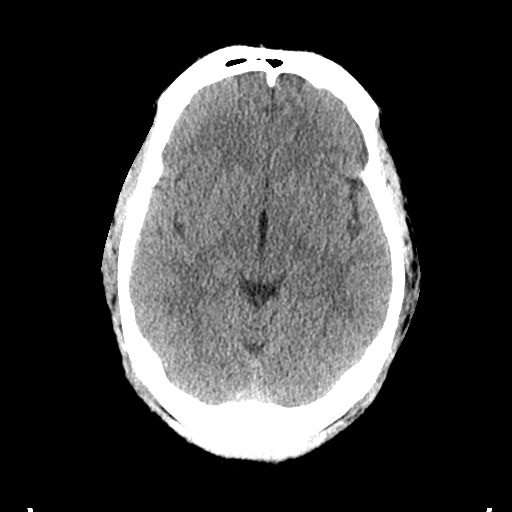
[im 17/33  brain]
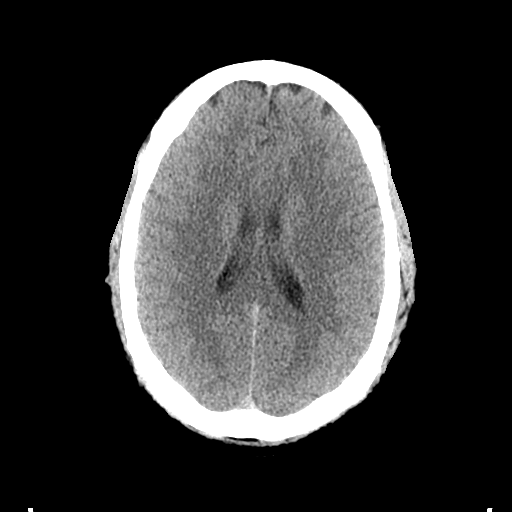
[im 17/33  bone]
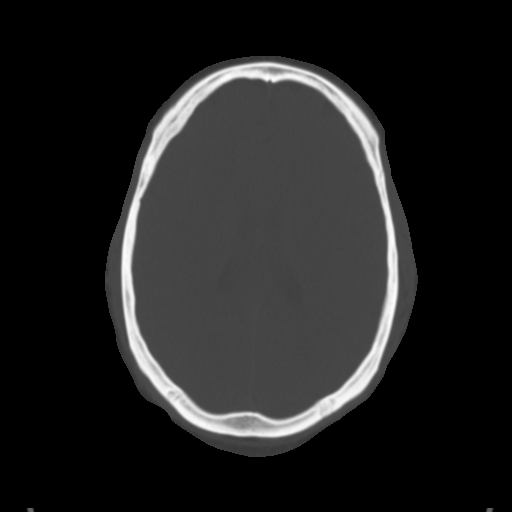
[im 20/33  brain]
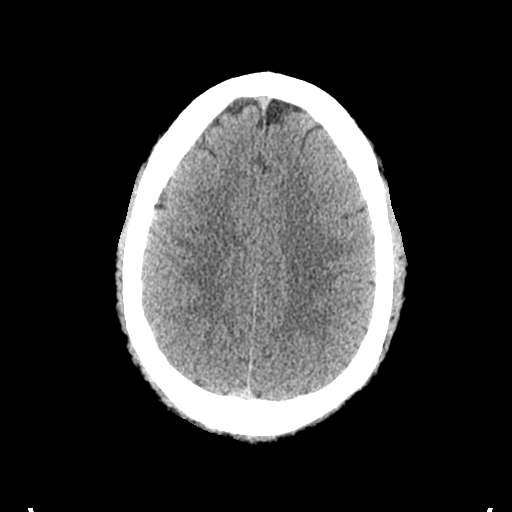
[im 24/33  brain]
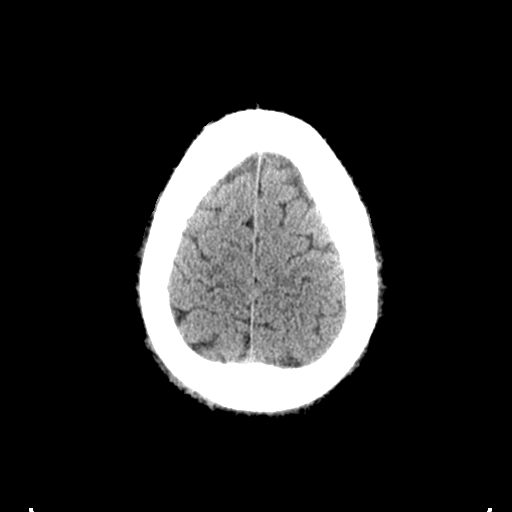
[im 27/33  brain]
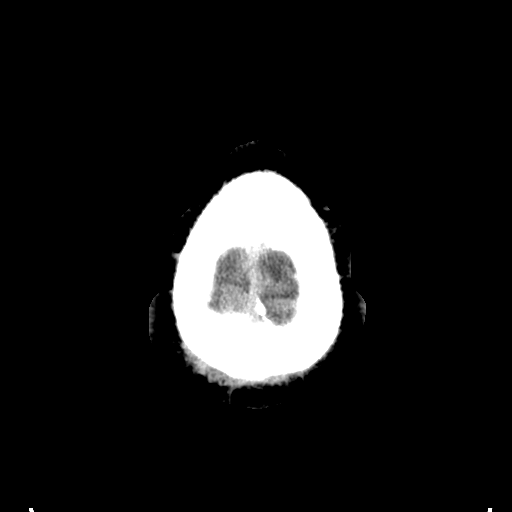
[im 30/33  brain]
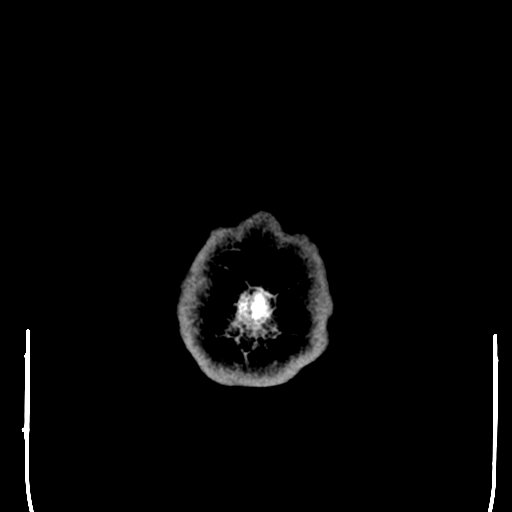
[im 30/33  bone]
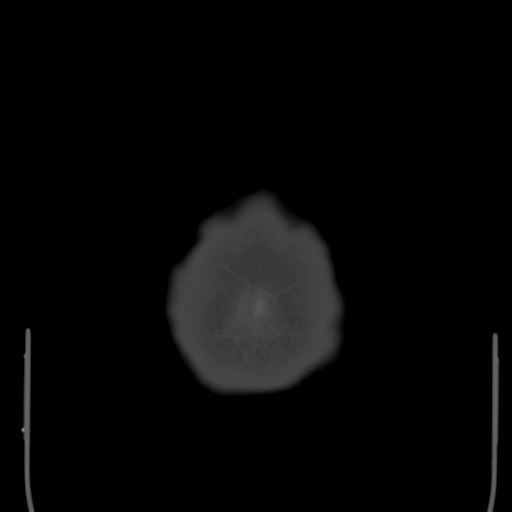

[Series 4: coronal soft tissue · coronal · 0.33mm/px · 3 of 80 slices shown]
[im 27/80  brain]
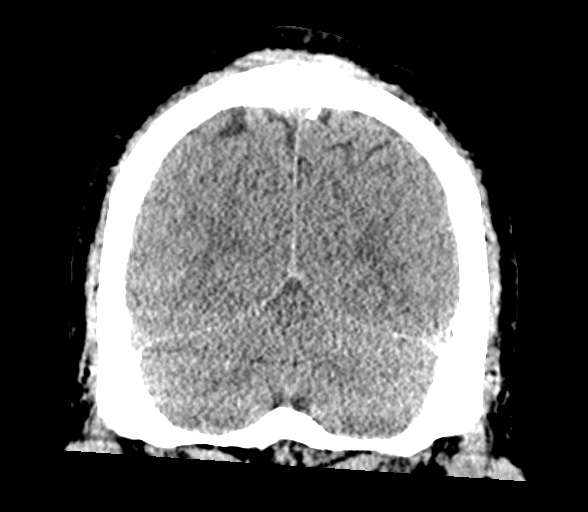
[im 36/80  brain]
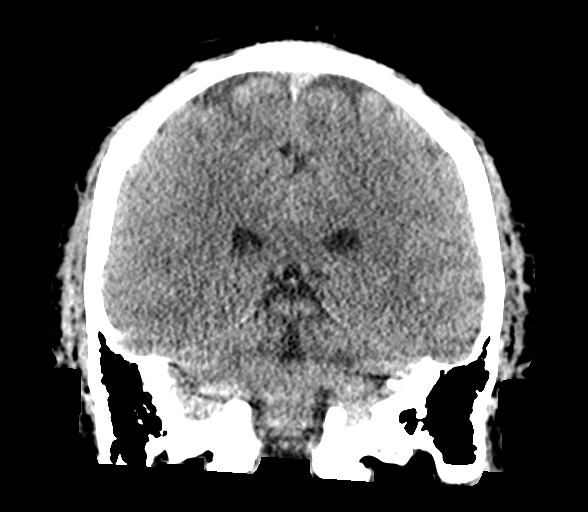
[im 44/80  brain]
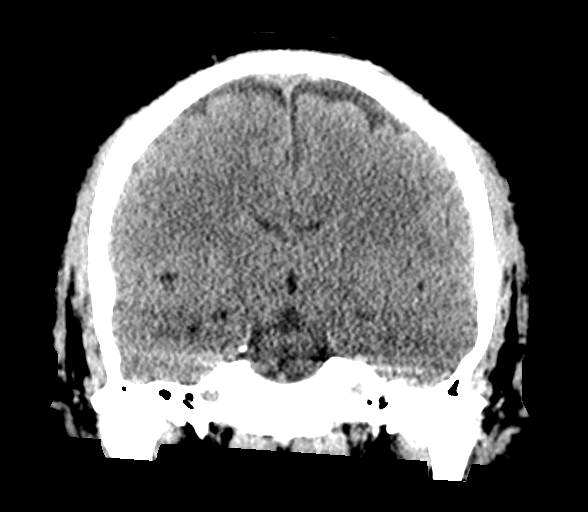

[Series 5: sagittal soft tissue · sagittal · 0.33mm/px · 3 of 65 slices shown]
[im 22/65  brain]
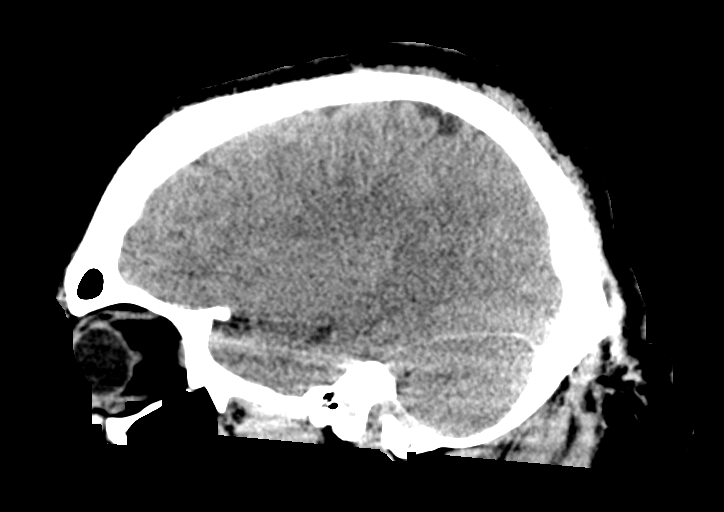
[im 33/65  brain]
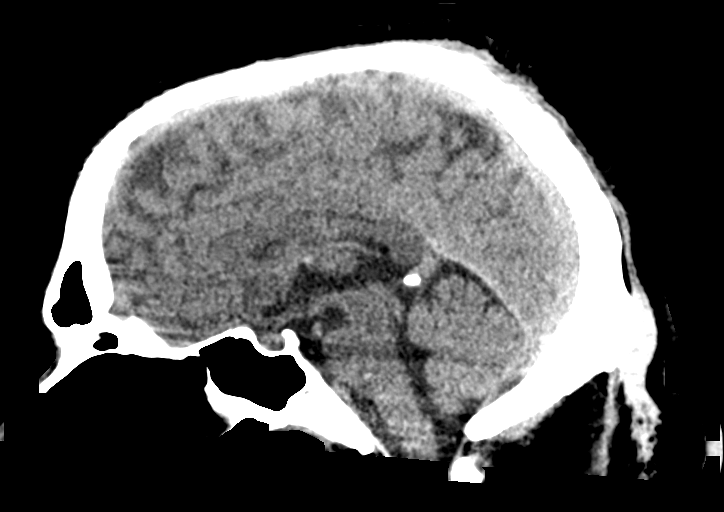
[im 43/65  brain]
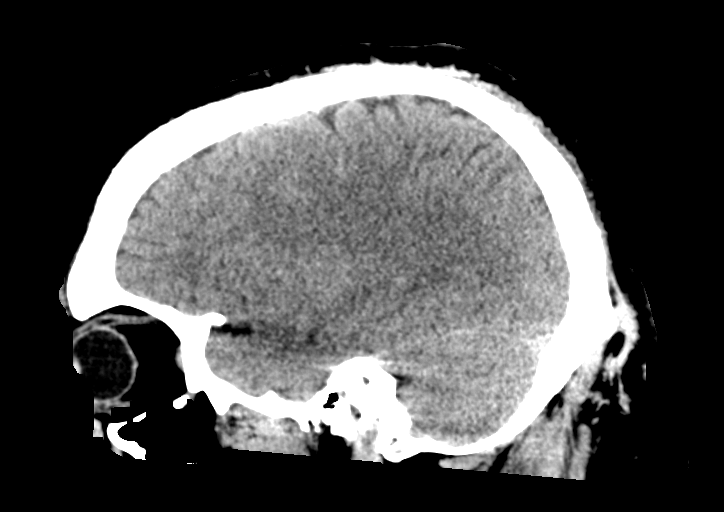

[15 of 47 positions shown; findings below may reference images not displayed]

FINDINGS: Brain:

No evidence of large-territorial acute infarction. No parenchymal
hemorrhage. No mass lesion. No extra-axial collection.

No mass effect or midline shift. No hydrocephalus. Basilar cisterns
are patent.

Vascular: No hyperdense vessel.

Skull: No acute fracture or focal lesion.

Sinuses/Orbits: Paranasal sinuses and mastoid air cells are clear.
The orbits are unremarkable.

Other: None.
IMPRESSION: No acute intracranial abnormality.

## 2022-08-28 MED ORDER — SODIUM CHLORIDE 0.9% FLUSH
10.0000 mL | Freq: Once | INTRAVENOUS | Status: AC
  Administered 2022-08-28: 10 mL via INTRAVENOUS
  Filled 2022-08-28: qty 10

## 2022-08-28 MED ORDER — HEPARIN SOD (PORK) LOCK FLUSH 100 UNIT/ML IV SOLN
500.0000 [IU] | Freq: Once | INTRAVENOUS | Status: AC
  Administered 2022-08-28: 500 [IU] via INTRAVENOUS
  Filled 2022-08-28: qty 5

## 2022-08-28 NOTE — Progress Notes (Signed)
Patient complains of 2 random nose bleeds in the last 2 weeks that lasted about 30 mins each. Has increased SOB at times and an iron taste in his mouth. Also states that he has noticed more bleeding of his gums when brushing his teeth. Concerned about platelet count.

## 2022-08-28 NOTE — Progress Notes (Signed)
It is also curious to me, if things are so bad why does not she have a lawyer? [facility]  Telephone:(336) [phone] Fax:(336) [phone]  ID: Samuel Rojas OB: 1990/03/14  MR#: [id number]  YQM#:[id number]  Patient Care Team: [facility] as PCP - General (General Practice) Jeralyn Ruths, MD as Consulting Physician (Hematology and Oncology)   CHIEF COMPLAINT: ITP, rhabdomyolysis.  INTERVAL HISTORY: Patient returns to clinic today as an add-on with complaints of several nosebleeds in the past 1 to 2 weeks.  He otherwise has felt well.  He continues to be active and work full-time.  He denies any other bleeding or bruising.  He has no neurologic complaints or recent seizures. He denies any recent fevers or illnesses. He has no chest pain, shortness of breath, cough, or hemoptysis.  He denies any nausea, vomiting, constipation, or diarrhea. He has no urinary complaints.  Patient offers no further specific complaints today.  REVIEW OF SYSTEMS:   Review of Systems  Constitutional: Negative.  Negative for fever, malaise/fatigue and weight loss.  HENT: Negative.  Negative for nosebleeds.   Respiratory: Negative.  Negative for cough, hemoptysis and shortness of breath.   Cardiovascular: Negative.  Negative for chest pain and leg swelling.  Gastrointestinal: Negative.  Negative for abdominal pain, blood in stool and melena.  Genitourinary: Negative.  Negative for hematuria.  Musculoskeletal: Negative.  Negative for back pain and myalgias.  Skin: Negative.  Negative for rash.  Neurological: Negative.  Negative for sensory change, focal weakness and weakness.  Endo/Heme/Allergies:  Does not bruise/bleed easily.  Psychiatric/Behavioral: Negative.  The patient is not nervous/anxious.     As per HPI. Otherwise, a complete review of systems is negative.  PAST MEDICAL HISTORY: Past Medical History:  Diagnosis Date   Cellulitis    left leg    Idiopathic thrombocytopenic purpura (ITP) ([facility])     PAST SURGICAL HISTORY: Past Surgical History:  Procedure Laterality Date   PORTA CATH INSERTION N/A 09/20/2017   Procedure: PORTA CATH INSERTION;  Surgeon: Annice Needy, MD;  Location: [facility];  Service: Cardiovascular;  Laterality: N/A;   TONSILLECTOMY      FAMILY HISTORY: Family History  Problem Relation Age of Onset   Hypertension Mother    Diabetes Mother        ADVANCED DIRECTIVES:    HEALTH MAINTENANCE: Social History   Tobacco Use   Smoking status: Never   Smokeless tobacco: Never  Vaping Use   Vaping status: Never Used  Substance Use Topics   Alcohol use: No   Drug use: No     Colonoscopy:  PAP:  Bone density:  Lipid panel:  Allergies  Allergen Reactions   Augmentin [Amoxicillin-Pot Clavulanate] Hives   Sulfamethoxazole-Trimethoprim     Dehydration, confusion , high BP    Current Outpatient Medications  Medication Sig Dispense Refill   lamoTRIgine (LAMICTAL PO) Take 2 capsules by mouth in the morning and at bedtime.     No current facility-administered medications for this visit.   Facility-Administered Medications Ordered in Other Visits  Medication Dose Route Frequency Provider Last Rate Last Admin   heparin lock flush 100 UNIT/ML injection            heparin lock flush 100 unit/mL  500 Units Intracatheter Once Jeralyn Ruths, MD       heparin lock flush 100 unit/mL  500 Units Intravenous Once Orlie Dakin Tollie Pizza, MD       heparin  lock flush 100 unit/mL  500 Units Intravenous Once Jeralyn Ruths, MD       sodium chloride flush (NS) 0.9 % injection 10 mL  10 mL Intravenous PRN Jeralyn Ruths, MD   10 mL at 02/27/20 1450   sodium chloride flush (NS) 0.9 % injection 10 mL  10 mL Intravenous Once Jeralyn Ruths, MD       sodium chloride flush (NS) 0.9 % injection 10 mL  10 mL Intravenous Once Jeralyn Ruths, MD       sodium chloride flush (NS) 0.9 % injection 10  mL  10 mL Intravenous PRN Jeralyn Ruths, MD   10 mL at 12/18/21 1403    OBJECTIVE: Vitals:   08/28/22 1217  BP: 130/76  Pulse: 69  Resp: 18  Temp: (!) 96.9 F (36.1 C)  SpO2: 100%     Body mass index is 60.2 kg/m.    ECOG FS:0 - Asymptomatic  General: Well-developed, well-nourished, no acute distress. Eyes: Pink conjunctiva, anicteric sclera. HEENT: Normocephalic, moist mucous membranes. Lungs: No audible wheezing or coughing. Heart: Regular rate and rhythm. Abdomen: Soft, nontender, no obvious distention. Musculoskeletal: No edema, cyanosis, or clubbing. Neuro: Alert, answering all questions appropriately. Cranial nerves grossly intact. Skin: No rashes or petechiae noted. Psych: Normal affect.  LAB RESULTS:  Lab Results  Component Value Date   NA 140 07/30/2020   K 3.2 (L) 07/30/2020   CL 106 07/30/2020   CO2 27 07/30/2020   GLUCOSE 119 (H) 07/30/2020   BUN 15 07/30/2020   CREATININE 0.66 07/30/2020   CALCIUM 8.8 (L) 07/30/2020   PROT 7.3 07/30/2020   ALBUMIN 4.0 07/30/2020   AST 23 07/30/2020   ALT 26 07/30/2020   ALKPHOS 77 07/30/2020   BILITOT 0.8 07/30/2020   GFRNONAA >60 07/30/2020   GFRAA >60 08/26/2019    Lab Results  Component Value Date   WBC 4.1 08/28/2022   NEUTROABS 2.6 08/28/2022   HGB 14.0 08/28/2022   HCT 42.8 08/28/2022   MCV 78.2 (L) 08/28/2022   PLT 99 (L) 08/28/2022   Lab Results  Component Value Date   IRON 50 09/09/2015   TIBC 320 09/09/2015   IRONPCTSAT 16 (L) 09/09/2015   Lab Results  Component Value Date   FERRITIN 75 09/09/2015     STUDIES: No results found.  TREATMENT HISTORY: Weekly Rituxan x4 completed on October 08, 2017 Weekly Rituxan x4 completed on September 09, 2018 Weekly Rituxan x4 completed on  March 22, 2020 Weekly Rituxan x4 completed on May 23, 2021  ASSESSMENT: ITP, rhabdomyolysis.  PLAN:     ITP: Previously, patient received 5 days of 100 mg prednisone with an excellent response to his  platelets increasing to greater than 200.  Unfortunately, response was not durable. 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  Splenic ultrasound did not reveal any evidence of splenomegaly.  Bone marrow biopsy was not completed.  Patient has had multiple rounds of weekly Rituxan x4. See above for patient treatment history.  Typically he has a delayed increase in his platelet count after receiving treatment.  Despite recent nosebleeds, patient's platelet count has improved to 99.  No intervention is needed.  Return to clinic in 6 months with laboratory work and video-assisted telemedicine visit. Poor venous access: Patient now has a port.  Continue port flushes every 6-8 weeks.  Patient can also receive lab work along with his port flush Nontraumatic rhabdomyolysis: Resolved. Possibly related  to recently diagnosed "nocturnal seizures" brought on by sleep apnea.  I spent a total of 20 minutes reviewing chart data, face-to-face evaluation with the patient, counseling and coordination of care as detailed above.   Patient expressed understanding and was in agreement with this plan. He also understands that He can call clinic at any time with any questions, concerns, or complaints.    Jeralyn Ruths, MD   08/28/2022 12:19 PM

## 2023-01-07 ENCOUNTER — Emergency Department: Payer: BC Managed Care – PPO

## 2023-01-07 ENCOUNTER — Encounter: Payer: Self-pay | Admitting: Emergency Medicine

## 2023-01-07 ENCOUNTER — Emergency Department
Admission: EM | Admit: 2023-01-07 | Discharge: 2023-01-07 | Disposition: A | Discharge status: Home / Self Care | Payer: BC Managed Care – PPO | Attending: Emergency Medicine | Admitting: Emergency Medicine

## 2023-01-07 ENCOUNTER — Other Ambulatory Visit: Payer: Self-pay

## 2023-01-07 DIAGNOSIS — R000 Tachycardia, unspecified: Secondary | ICD-10-CM | POA: Diagnosis not present

## 2023-01-07 DIAGNOSIS — J189 Pneumonia, unspecified organism: Secondary | ICD-10-CM | POA: Insufficient documentation

## 2023-01-07 DIAGNOSIS — R0789 Other chest pain: Secondary | ICD-10-CM | POA: Diagnosis present

## 2023-01-07 DIAGNOSIS — Z20822 Contact with and (suspected) exposure to covid-19: Secondary | ICD-10-CM | POA: Diagnosis not present

## 2023-01-07 LAB — BASIC METABOLIC PANEL
Anion gap: 9 (ref 5–15)
BUN: 13 mg/dL (ref 6–20)
CO2: 26 mmol/L (ref 22–32)
Calcium: 8.6 mg/dL — ABNORMAL LOW (ref 8.9–10.3)
Chloride: 101 mmol/L (ref 98–111)
Creatinine, Ser: 0.91 mg/dL (ref 0.61–1.24)
GFR, Estimated: >60 mL/min (ref >60)
Glucose, Bld: 130 mg/dL — ABNORMAL HIGH (ref 70–99)
Potassium: 3.7 mmol/L (ref 3.5–5.1)
Sodium: 136 mmol/L (ref 135–145)

## 2023-01-07 LAB — CBC
HCT: 45.6 % (ref 39.0–52.0)
Hemoglobin: 14.5 g/dL (ref 13.0–17.0)
MCH: 25.5 pg — ABNORMAL LOW (ref 26.0–34.0)
MCHC: 31.8 g/dL (ref 30.0–36.0)
MCV: 80.3 fL (ref 80.0–100.0)
Platelets: 130 10*3/uL — ABNORMAL LOW (ref 150–400)
RBC: 5.68 MIL/uL (ref 4.22–5.81)
RDW: 14.2 % (ref 11.5–15.5)
WBC: 11 10*3/uL — ABNORMAL HIGH (ref 4.0–10.5)
nRBC: 0 % (ref 0.0–0.2)

## 2023-01-07 LAB — LACTIC ACID, PLASMA: Lactic Acid, Venous: 1.2 mmol/L (ref 0.5–1.9)

## 2023-01-07 LAB — APTT: aPTT: 32 s (ref 24–36)

## 2023-01-07 LAB — TROPONIN I (HIGH SENSITIVITY)
Troponin I (High Sensitivity): 8 ng/L (ref <18)
Troponin I (High Sensitivity): 7 ng/L (ref <18)

## 2023-01-07 LAB — PROTIME-INR
INR: 1 (ref 0.8–1.2)
Prothrombin Time: 13.8 s (ref 11.4–15.2)

## 2023-01-07 LAB — SARS CORONAVIRUS 2 BY RT PCR: SARS Coronavirus 2 by RT PCR: NEGATIVE

## 2023-01-07 MED ORDER — ACETAMINOPHEN 500 MG PO TABS
ORAL_TABLET | ORAL | Status: AC
  Administered 2023-01-07: 1000 mg via ORAL
  Filled 2023-01-07: qty 2

## 2023-01-07 MED ORDER — LEVOFLOXACIN 750 MG PO TABS: 750.0000 mg | ORAL_TABLET | Freq: Every day | ORAL | 0 refills | Status: AC

## 2023-01-07 MED ORDER — SODIUM CHLORIDE 0.9 % IV BOLUS
1000.0000 mL | Freq: Once | INTRAVENOUS | Status: AC
  Administered 2023-01-07: 1000 mL via INTRAVENOUS

## 2023-01-07 MED ORDER — ACETAMINOPHEN 500 MG PO TABS: 1000.0000 mg | ORAL_TABLET | Freq: Once | ORAL | Status: AC

## 2023-01-07 MED ORDER — LEVOFLOXACIN 500 MG PO TABS
750.0000 mg | ORAL_TABLET | Freq: Once | ORAL | Status: AC
  Administered 2023-01-07: 750 mg via ORAL
  Filled 2023-01-07: qty 2

## 2023-01-07 NOTE — ED Triage Notes (Signed)
Patient ambulatory to triage with steady gait, without difficulty or distress noted; pt reports since last night having upper CP radiating into neck and back accomp by hemoptysis; st pain increases with deep breathing; denies hx

## 2023-01-07 NOTE — ED Notes (Addendum)
Pt. Unhooked from cardiac monitoring to get up to toilet. Pt. Ambulates without difficulty. Pt. States he feels much better and he would like to go home. MD notified. MD aware pt is still tachycardic, and temp is 100.2.

## 2023-01-07 NOTE — ED Notes (Signed)
Attempted lab draw to rt hand and rt a/c without success; pt tolerated well; lab called for assistance on blood draw

## 2023-01-07 NOTE — ED Provider Notes (Signed)
[facility] Provider Note    Event Date/Time   First MD Initiated Contact with Patient 01/07/23 [id number]     (approximate)   History   Chief Complaint: Chest Pain   HPI  Samuel Rojas is a 32 y.o. male with a past history of ITP and morbid obesity who comes to the ED complaining of left upper chest pain associated with productive cough.  Not exertional.  Hurts with cough or deep breathing.  No shortness of breath.  Does have chills and bodyaches, no fever.  No vomiting or diarrhea.  No dizziness.  Tolerating oral intake.          Physical Exam   Triage Vital Signs: ED Triage Vitals  Encounter Vitals Group     BP 01/07/23 0436 (!) 156/88     Systolic BP Percentile --      Diastolic BP Percentile --      Pulse Rate 01/07/23 0436 (!) 111     Resp 01/07/23 0436 18     Temp 01/07/23 0436 97.7 F (36.5 C)     Temp Source 01/07/23 0728 Oral     SpO2 01/07/23 0436 92 %     Weight 01/07/23 0431 (!) 373 lb (169.2 kg)     Height 01/07/23 0431 5\' 6"  (1.676 m)     Head Circumference --      Peak Flow --      Pain Score 01/07/23 0431 10     Pain Loc --      Pain Education --      Exclude from Growth Chart --     Most recent vital signs: Vitals:   01/07/23 1230 01/07/23 1300  BP: (!) 125/51 118/66  Pulse: (!) 116 (!) 114  Resp: 15 (!) 40  Temp:    SpO2: 96% 96%    General: Awake, no distress.  CV:  Good peripheral perfusion.  Tachycardia heart rate 120.  Normal distal pulses. Resp:  Normal effort.  Crackles in left lower lung.  No wheezing.  Otherwise good air movement. Abd:  No distention.  Soft nontender.  No lower extremity edema Other:  Dry oral mucosa   ED Results / Procedures / Treatments   Labs (all labs ordered are listed, but only abnormal results are displayed) Labs Reviewed  CBC - Abnormal; Notable for the following components:      Result Value   WBC 11.0 (*)    MCH 25.5 (*)    Platelets 130 (*)    All other  components within normal limits  BASIC METABOLIC PANEL - Abnormal; Notable for the following components:   Glucose, Bld 130 (*)    Calcium 8.6 (*)    All other components within normal limits  SARS CORONAVIRUS 2 BY RT PCR  CULTURE, BLOOD (ROUTINE X 2)  PROTIME-INR  APTT  LACTIC ACID, PLASMA  TROPONIN I (HIGH SENSITIVITY)  TROPONIN I (HIGH SENSITIVITY)     EKG Interpreted by me Sinus tachycardia rate 109.  Left axis, normal intervals.  Normal QRS ST segments and T waves   RADIOLOGY Chest x-ray interpreted by me, shows consolidation in the left lower lung consistent with pneumonia.  Radiology report reviewed.   PROCEDURES:  Procedures   MEDICATIONS ORDERED IN ED: Medications  acetaminophen (TYLENOL) tablet 1,000 mg (1,000 mg Oral Given 01/07/23 0752)  sodium chloride 0.9 % bolus 1,000 mL (0 mLs Intravenous Stopped 01/07/23 1128)  levofloxacin (LEVAQUIN) tablet 750 mg (750 mg Oral Given 01/07/23 0903)  IMPRESSION / MDM / ASSESSMENT AND PLAN / ED COURSE  I reviewed the triage vital signs and the nursing notes.  DDx: Pneumonia, COVID, NSTEMI, electrolyte abnormality, AKI, pleural effusion  Patient's presentation is most consistent with acute presentation with potential threat to life or bodily function.  Patient presents with symptoms of viral illness versus bacterial pneumonia.  Oxygenation is normal, he is nontoxic and not septic.  Will give IV fluids for hydration, start on Levaquin and reassess.   Clinical Course as of 01/07/23 1440  Thu Jan 07, 2023  1252 Patient feeling much better, sitting upright, breathing comfortably.  Tachycardia is improved but persistent.  Also has tachypnea which I think is related to his body habitus and morbid obesity.  Oxygenation is very good, and I do not think he is septic.  He is comfortable with outpatient management which I think is reasonable as well.  Return precautions discussed. [PS]    Clinical Course User Index [PS]  Sharman Cheek, MD     FINAL CLINICAL IMPRESSION(S) / ED DIAGNOSES   Final diagnoses:  Community acquired pneumonia, unspecified laterality     Rx / DC Orders   ED Discharge Orders          Ordered    levofloxacin (LEVAQUIN) 750 MG tablet  Daily        01/07/23 1243             Note:  This document was prepared using Dragon voice recognition software and may include unintentional dictation errors.   Sharman Cheek, MD 01/07/23 1440

## 2023-01-12 LAB — CULTURE, BLOOD (ROUTINE X 2): Culture: NO GROWTH

## 2023-03-01 ENCOUNTER — Inpatient Hospital Stay: Payer: 59 | Attending: Oncology

## 2023-03-01 ENCOUNTER — Encounter: Payer: Self-pay | Admitting: Oncology

## 2023-03-01 ENCOUNTER — Inpatient Hospital Stay: Payer: 59

## 2023-03-01 ENCOUNTER — Inpatient Hospital Stay: Payer: 59 | Admitting: Oncology

## 2023-03-01 DIAGNOSIS — Z452 Encounter for adjustment and management of vascular access device: Secondary | ICD-10-CM | POA: Insufficient documentation

## 2023-03-01 DIAGNOSIS — D693 Immune thrombocytopenic purpura: Secondary | ICD-10-CM | POA: Insufficient documentation

## 2023-03-01 LAB — CBC WITH DIFFERENTIAL/PLATELET
Abs Immature Granulocytes: 0.02 10*3/uL (ref 0.00–0.07)
Basophils Absolute: 0 10*3/uL (ref 0.0–0.1)
Basophils Relative: 0 %
Eosinophils Absolute: 0.1 10*3/uL (ref 0.0–0.5)
Eosinophils Relative: 2 %
HCT: 46.6 % (ref 39.0–52.0)
Hemoglobin: 14.9 g/dL (ref 13.0–17.0)
Immature Granulocytes: 0 %
Lymphocytes Relative: 25 %
Lymphs Abs: 1.5 10*3/uL (ref 0.7–4.0)
MCH: 25.3 pg — ABNORMAL LOW (ref 26.0–34.0)
MCHC: 32 g/dL (ref 30.0–36.0)
MCV: 79 fL — ABNORMAL LOW (ref 80.0–100.0)
Monocytes Absolute: 0.2 10*3/uL (ref 0.1–1.0)
Monocytes Relative: 4 %
Neutro Abs: 4 10*3/uL (ref 1.7–7.7)
Neutrophils Relative %: 69 %
Platelets: 177 10*3/uL (ref 150–400)
RBC: 5.9 MIL/uL — ABNORMAL HIGH (ref 4.22–5.81)
RDW: 14.2 % (ref 11.5–15.5)
WBC: 5.8 10*3/uL (ref 4.0–10.5)
nRBC: 0 % (ref 0.0–0.2)

## 2023-03-01 MED ORDER — SODIUM CHLORIDE 0.9% FLUSH
10.0000 mL | Freq: Once | INTRAVENOUS | Status: AC
  Administered 2023-03-01: 10 mL via INTRAVENOUS
  Filled 2023-03-01: qty 10

## 2023-03-01 MED ORDER — HEPARIN SOD (PORK) LOCK FLUSH 100 UNIT/ML IV SOLN
500.0000 [IU] | Freq: Once | INTRAVENOUS | Status: AC
  Administered 2023-03-01: 500 [IU] via INTRAVENOUS
  Filled 2023-03-01: qty 5

## 2023-03-02 NOTE — Progress Notes (Signed)
 This encounter was created in error - please disregard.

## 2023-03-04 ENCOUNTER — Inpatient Hospital Stay (HOSPITAL_BASED_OUTPATIENT_CLINIC_OR_DEPARTMENT_OTHER): Payer: 59 | Admitting: Oncology

## 2023-03-04 DIAGNOSIS — M6282 Rhabdomyolysis: Secondary | ICD-10-CM | POA: Diagnosis not present

## 2023-03-04 DIAGNOSIS — D693 Immune thrombocytopenic purpura: Secondary | ICD-10-CM

## 2023-03-04 NOTE — Progress Notes (Signed)
It is also curious to me, if things are so bad why does not she have a lawyer? [facility]  [phone] [phone]  ID: Samuel Rojas OB: 07-10-1990  MR#: [id number]  NGE#:[id number]  Patient Care Team: [facility] as PCP - General (General Practice) Jeralyn Ruths, MD as Consulting Physician (Hematology and Oncology)  I connected with Samuel Rojas on 03/04/23 at  3:30 PM EST by video enabled telemedicine visit and verified that I am speaking with the correct person using two identifiers.   I discussed the limitations, risks, security and privacy concerns of performing an evaluation and management service by telemedicine and the availability of in-person appointments. I also discussed with the patient that there may be a patient responsible charge related to this service. The patient expressed understanding and agreed to proceed.   Other persons participating in the visit and their role in the encounter: Patient, MD.  Patient's location: Home. Provider's location: Clinic.  CHIEF COMPLAINT: ITP, rhabdomyolysis.  INTERVAL HISTORY: Patient agreed to video-assisted telemedicine visit for further evaluation and discussion of his laboratory results.  He currently feels well and is asymptomatic.  He denies any easy bleeding or bruising.  He continues to be active and work full-time. He has no neurologic complaints or recent seizures. He denies any recent fevers or illnesses. He has no chest pain, shortness of breath, cough, or hemoptysis.  He denies any nausea, vomiting, constipation, or diarrhea. He has no urinary complaints.  Patient offers no specific complaints today.  REVIEW OF SYSTEMS:   Review of Systems  Constitutional: Negative.  Negative for fever, malaise/fatigue and weight loss.  HENT: Negative.  Negative for nosebleeds.   Respiratory: Negative.  Negative for cough, hemoptysis and shortness of  breath.   Cardiovascular: Negative.  Negative for chest pain and leg swelling.  Gastrointestinal: Negative.  Negative for abdominal pain, blood in stool and melena.  Genitourinary: Negative.  Negative for hematuria.  Musculoskeletal: Negative.  Negative for back pain and myalgias.  Skin: Negative.  Negative for rash.  Neurological: Negative.  Negative for sensory change, focal weakness and weakness.  Endo/Heme/Allergies:  Does not bruise/bleed easily.  Psychiatric/Behavioral: Negative.  The patient is not nervous/anxious.     As per HPI. Otherwise, a complete review of systems is negative.  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HCC)     PAST SURGICAL HISTORY: Past Surgical History:  Procedure Laterality Date   PORTA CATH INSERTION N/A 09/20/2017   Procedure: PORTA CATH INSERTION;  Surgeon: Annice Needy, MD;  Location: [facility] INVASIVE CV LAB;  Service: Cardiovascular;  Laterality: N/A;   TONSILLECTOMY      FAMILY HISTORY: Family History  Problem Relation Age of Onset   Hypertension Mother    Diabetes Mother        ADVANCED DIRECTIVES:    HEALTH MAINTENANCE: Social History   Tobacco Use   Smoking status: Never   Smokeless tobacco: Never  Vaping Use   Vaping status: Never Used  Substance Use Topics   Alcohol use: No   Drug use: No     Colonoscopy:  PAP:  Bone density:  Lipid panel:  Allergies  Allergen Reactions   Augmentin [Amoxicillin-Pot Clavulanate] Hives   Sulfamethoxazole-Trimethoprim     Dehydration, confusion , high BP    Current Outpatient Medications  Medication Sig Dispense Refill   lamoTRIgine (LAMICTAL  PO) Take 2 capsules by mouth in the morning and at bedtime.     No current facility-administered medications for this visit.   Facility-Administered Medications Ordered in Other Visits  Medication Dose Route Frequency Provider Last Rate Last Admin    heparin lock flush 100 UNIT/ML injection            heparin lock flush 100 unit/mL  500 Units Intracatheter Once Orlie Dakin, Tollie Pizza, MD       heparin lock flush 100 unit/mL  500 Units Intravenous Once Orlie Dakin, Tollie Pizza, MD       heparin lock flush 100 unit/mL  500 Units Intravenous Once Jeralyn Ruths, MD       sodium chloride flush (NS) 0.9 % injection 10 mL  10 mL Intravenous PRN Jeralyn Ruths, MD   10 mL at 02/27/20 1450   sodium chloride flush (NS) 0.9 % injection 10 mL  10 mL Intravenous Once Jeralyn Ruths, MD       sodium chloride flush (NS) 0.9 % injection 10 mL  10 mL Intravenous Once Jeralyn Ruths, MD       sodium chloride flush (NS) 0.9 % injection 10 mL  10 mL Intravenous PRN Jeralyn Ruths, MD   10 mL at 12/18/21 1403    OBJECTIVE: There were no vitals filed for this visit.    There is no height or weight on file to calculate BMI.    ECOG FS:0 - Asymptomatic  General: Well-developed, well-nourished, no acute distress. HEENT: Normocephalic. Neuro: Alert, answering all questions appropriately. Cranial nerves grossly intact. Psych: Normal affect.  LAB RESULTS:  Lab Results  Component Value Date   NA 136 01/07/2023   K 3.7 01/07/2023   CL 101 01/07/2023   CO2 26 01/07/2023   GLUCOSE 130 (H) 01/07/2023   BUN 13 01/07/2023   CREATININE 0.91 01/07/2023   CALCIUM 8.6 (L) 01/07/2023   PROT 7.3 07/30/2020   ALBUMIN 4.0 07/30/2020   AST 23 07/30/2020   ALT 26 07/30/2020   ALKPHOS 77 07/30/2020   BILITOT 0.8 07/30/2020   GFRNONAA >60 01/07/2023   GFRAA >60 08/26/2019    Lab Results  Component Value Date   WBC 5.8 03/01/2023   NEUTROABS 4.0 03/01/2023   HGB 14.9 03/01/2023   HCT 46.6 03/01/2023   MCV 79.0 (L) 03/01/2023   PLT 177 03/01/2023   Lab Results  Component Value Date   IRON 50 09/09/2015   TIBC 320 09/09/2015   IRONPCTSAT 16 (L) 09/09/2015   Lab Results  Component Value Date   FERRITIN 75 09/09/2015     STUDIES: No  results found.  TREATMENT HISTORY: Weekly Rituxan x4 completed on October 08, 2017 Weekly Rituxan x4 completed on September 09, 2018 Weekly Rituxan x4 completed on  March 22, 2020 Weekly Rituxan x4 completed on May 23, 2021  ASSESSMENT: ITP, rhabdomyolysis.  PLAN:     ITP: Previously, patient received 5 days of 100 mg prednisone with an excellent response to his platelets increasing to greater than 200.  Unfortunately, response was not durable. 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  Splenic ultrasound did not reveal any evidence of splenomegaly.  Bone marrow biopsy was not completed.  Patient has had multiple rounds of weekly Rituxan x4. See above for patient treatment history.  Typically he has a delayed increase in his platelet count after receiving treatment.  Patient's platelet count continues to be within normal limits at 177.  No intervention is needed.  Return to clinic in 3 months for laboratory work only and then in 6 months for laboratory work and video-assisted telemedicine visit.   Poor venous access: Patient now has a port.  Continue port flushes every 6-8 weeks.  Patient can also receive lab work along with his port flush Nontraumatic rhabdomyolysis: Resolved. Possibly related to recently diagnosed "nocturnal seizures" brought on by sleep apnea.  I provided 20 minutes of face-to-face video visit time during this encounter which included chart review, counseling, and coordination of care as documented above.    Patient expressed understanding and was in agreement with this plan. He also understands that He can call clinic at any time with any questions, concerns, or complaints.    Jeralyn Ruths, MD   03/04/2023 3:05 PM

## 2023-03-15 ENCOUNTER — Ambulatory Visit: Payer: 59 | Admitting: Nurse Practitioner

## 2023-03-15 NOTE — Progress Notes (Deleted)
   There were no vitals taken for this visit.   Subjective:    Patient ID: Samuel Rojas, male    DOB: [birth year], 33 y.o.   MRN: [id number]  HPI: Samuel Rojas is a 33 y.o. male  No chief complaint on file.   Discussed the use of AI scribe software for clinical note transcription with the patient, who gave verbal consent to proceed.  History of Present Illness            No data to display          Relevant past medical, surgical, family and social history reviewed and updated as indicated. Interim medical history since our last visit reviewed. Allergies and medications reviewed and updated.  Review of Systems  Per HPI unless specifically indicated above     Objective:    There were no vitals taken for this visit.  {Vitals History (Optional):23777} Wt Readings from Last 3 Encounters:  01/07/23 (!) 373 lb (169.2 kg)  08/28/22 (!) 373 lb (169.2 kg)  03/31/22 (!) 376 lb (170.6 kg)    Physical Exam  Results for orders placed or performed in visit on 03/01/23  CBC with Differential/Platelet   Collection Time: 03/01/23  8:48 AM  Result Value Ref Range   WBC 5.8 4.0 - 10.5 K/uL   RBC 5.90 (H) 4.22 - 5.81 MIL/uL   Hemoglobin 14.9 13.0 - 17.0 g/dL   HCT 62.1 30.8 - 65.7 %   MCV 79.0 (L) 80.0 - 100.0 fL   MCH 25.3 (L) 26.0 - 34.0 pg   MCHC 32.0 30.0 - 36.0 g/dL   RDW 84.6 96.2 - 95.2 %   Platelets 177 150 - 400 K/uL   nRBC 0.0 0.0 - 0.2 %   Neutrophils Relative % 69 %   Neutro Abs 4.0 1.7 - 7.7 K/uL   Lymphocytes Relative 25 %   Lymphs Abs 1.5 0.7 - 4.0 K/uL   Monocytes Relative 4 %   Monocytes Absolute 0.2 0.1 - 1.0 K/uL   Eosinophils Relative 2 %   Eosinophils Absolute 0.1 0.0 - 0.5 K/uL   Basophils Relative 0 %   Basophils Absolute 0.0 0.0 - 0.1 K/uL   Immature Granulocytes 0 %   Abs Immature Granulocytes 0.02 0.00 - 0.07 K/uL   {Labs (Optional):23779}    Assessment & Plan:   Problem List Items Addressed This Visit   None     Assessment and Plan             Follow up plan: No follow-ups on file.

## 2023-04-12 ENCOUNTER — Other Ambulatory Visit: Payer: Self-pay | Admitting: *Deleted

## 2023-04-12 ENCOUNTER — Ambulatory Visit
Admission: RE | Admit: 2023-04-12 | Discharge: 2023-04-12 | Disposition: A | Discharge status: Home / Self Care | Source: Ambulatory Visit | Attending: Oncology | Admitting: Oncology

## 2023-04-12 ENCOUNTER — Inpatient Hospital Stay: Payer: 59 | Attending: Oncology

## 2023-04-12 VITALS — BP 127/77 | HR 72 | Temp 96.6°F | Resp 18

## 2023-04-12 DIAGNOSIS — T82524A Displacement of infusion catheter, initial encounter: Secondary | ICD-10-CM | POA: Insufficient documentation

## 2023-04-12 DIAGNOSIS — Z452 Encounter for adjustment and management of vascular access device: Secondary | ICD-10-CM | POA: Insufficient documentation

## 2023-04-12 DIAGNOSIS — Z95828 Presence of other vascular implants and grafts: Secondary | ICD-10-CM

## 2023-04-12 DIAGNOSIS — D693 Immune thrombocytopenic purpura: Secondary | ICD-10-CM | POA: Insufficient documentation

## 2023-04-12 DIAGNOSIS — T859XXA Unspecified complication of internal prosthetic device, implant and graft, initial encounter: Secondary | ICD-10-CM | POA: Diagnosis present

## 2023-04-12 HISTORY — PX: IR CV LINE INJECTION: IMG2294

## 2023-04-12 MED ORDER — IOHEXOL 300 MG/ML  SOLN
7.0000 mL | Freq: Once | INTRAMUSCULAR | Status: AC | PRN
Start: 2023-04-12 — End: 2023-04-12
  Administered 2023-04-12: 7 mL via INTRAVENOUS

## 2023-04-12 MED ORDER — HEPARIN SOD (PORK) LOCK FLUSH 100 UNIT/ML IV SOLN
500.0000 [IU] | Freq: Once | INTRAVENOUS | Status: AC
  Administered 2023-04-12: 500 [IU] via INTRAVENOUS

## 2023-04-12 MED ORDER — SODIUM CHLORIDE 0.9% FLUSH
10.0000 mL | INTRAVENOUS | Status: AC | PRN
Start: 2023-04-12 — End: ?
  Filled 2023-04-12: qty 10

## 2023-04-12 MED ORDER — ALTEPLASE 2 MG IJ SOLR
2.0000 mg | Freq: Once | INTRAMUSCULAR | Status: AC | PRN
Start: 2023-04-12 — End: 2023-04-12
  Administered 2023-04-12: 2 mg
  Filled 2023-04-12: qty 2

## 2023-04-12 MED ORDER — HEPARIN SOD (PORK) LOCK FLUSH 100 UNIT/ML IV SOLN
500.0000 [IU] | Freq: Once | INTRAVENOUS | Status: AC
  Filled 2023-04-12: qty 5

## 2023-04-12 MED ORDER — HEPARIN SOD (PORK) LOCK FLUSH 100 UNIT/ML IV SOLN
INTRAVENOUS | Status: AC
  Filled 2023-04-12: qty 5

## 2023-05-24 ENCOUNTER — Inpatient Hospital Stay: Payer: 59 | Attending: Oncology

## 2023-05-24 ENCOUNTER — Other Ambulatory Visit: Payer: Self-pay

## 2023-05-24 ENCOUNTER — Telehealth: Payer: Self-pay

## 2023-05-24 ENCOUNTER — Ambulatory Visit

## 2023-05-24 DIAGNOSIS — D693 Immune thrombocytopenic purpura: Secondary | ICD-10-CM

## 2023-05-24 LAB — CBC WITH DIFFERENTIAL/PLATELET
Abs Immature Granulocytes: 0.02 10*3/uL (ref 0.00–0.07)
Basophils Absolute: 0 10*3/uL (ref 0.0–0.1)
Basophils Relative: 0 %
Eosinophils Absolute: 0.1 10*3/uL (ref 0.0–0.5)
Eosinophils Relative: 2 %
HCT: 43.2 % (ref 39.0–52.0)
Hemoglobin: 13.9 g/dL (ref 13.0–17.0)
Immature Granulocytes: 0 %
Lymphocytes Relative: 30 %
Lymphs Abs: 1.6 10*3/uL (ref 0.7–4.0)
MCH: 25.6 pg — ABNORMAL LOW (ref 26.0–34.0)
MCHC: 32.2 g/dL (ref 30.0–36.0)
MCV: 79.4 fL — ABNORMAL LOW (ref 80.0–100.0)
Monocytes Absolute: 0.3 10*3/uL (ref 0.1–1.0)
Monocytes Relative: 5 %
Neutro Abs: 3.3 10*3/uL (ref 1.7–7.7)
Neutrophils Relative %: 63 %
Platelets: 120 10*3/uL — ABNORMAL LOW (ref 150–400)
RBC: 5.44 MIL/uL (ref 4.22–5.81)
RDW: 13.8 % (ref 11.5–15.5)
WBC: 5.4 10*3/uL (ref 4.0–10.5)
nRBC: 0 % (ref 0.0–0.2)

## 2023-05-24 NOTE — Telephone Encounter (Signed)
 Port removal orders faxed over to [facility].

## 2023-05-24 NOTE — Addendum Note (Signed)
 Addended by: COOKE, Natsuko Kelsay E on: 05/24/2023 01:27 PM   Modules accepted: Orders

## 2023-05-25 ENCOUNTER — Telehealth (INDEPENDENT_AMBULATORY_CARE_PROVIDER_SITE_OTHER): Payer: Self-pay

## 2023-05-25 NOTE — Telephone Encounter (Addendum)
 Spoke with the patient and he is scheduled with Dr. Vonna Guardian for a port removal with a 10:00 am arrival time to the [facility] on 06/03/23. Pre-procedure instructions were discussed and will be sent to Mychart and mailed.

## 2023-06-02 ENCOUNTER — Other Ambulatory Visit: Payer: 59

## 2023-06-03 ENCOUNTER — Ambulatory Visit
Admission: RE | Admit: 2023-06-03 | Discharge: 2023-06-03 | Disposition: A | Discharge status: Home / Self Care | Attending: Vascular Surgery | Admitting: Vascular Surgery

## 2023-06-03 ENCOUNTER — Encounter: Payer: Self-pay | Admitting: Vascular Surgery

## 2023-06-03 ENCOUNTER — Encounter
Admission: RE | Disposition: A | Discharge status: Home / Self Care | Payer: Self-pay | Source: Home / Self Care | Attending: Vascular Surgery

## 2023-06-03 ENCOUNTER — Other Ambulatory Visit: Payer: Self-pay

## 2023-06-03 DIAGNOSIS — Z452 Encounter for adjustment and management of vascular access device: Secondary | ICD-10-CM | POA: Diagnosis present

## 2023-06-03 DIAGNOSIS — D693 Immune thrombocytopenic purpura: Secondary | ICD-10-CM

## 2023-06-03 HISTORY — PX: PORTA CATH REMOVAL: CATH118286

## 2023-06-03 HISTORY — DX: Epilepsy, unspecified, not intractable, without status epilepticus: G40.909

## 2023-06-03 SURGERY — PORTA CATH REMOVAL
Anesthesia: Moderate Sedation

## 2023-06-03 MED ORDER — MIDAZOLAM HCL 2 MG/ML PO SYRP: 8.0000 mg | ORAL_SOLUTION | Freq: Once | ORAL | Status: DC | PRN

## 2023-06-03 MED ORDER — SODIUM CHLORIDE 0.9 % IV SOLN: INTRAVENOUS | Status: DC

## 2023-06-03 MED ORDER — MIDAZOLAM HCL 2 MG/2ML IJ SOLN
INTRAMUSCULAR | Status: DC | PRN
  Administered 2023-06-03: 2 mg via INTRAVENOUS
  Administered 2023-06-03: 1 mg via INTRAVENOUS

## 2023-06-03 MED ORDER — ONDANSETRON HCL 4 MG/2ML IJ SOLN: 4.0000 mg | Freq: Four times a day (QID) | INTRAMUSCULAR | Status: DC | PRN

## 2023-06-03 MED ORDER — FENTANYL CITRATE (PF) 100 MCG/2ML IJ SOLN
INTRAMUSCULAR | Status: DC | PRN
  Administered 2023-06-03: 50 ug via INTRAVENOUS
  Administered 2023-06-03: 25 ug via INTRAVENOUS

## 2023-06-03 MED ORDER — HYDROMORPHONE HCL 1 MG/ML IJ SOLN
1.0000 mg | Freq: Once | INTRAMUSCULAR | Status: DC | PRN
Start: 2023-06-03 — End: 2023-06-03

## 2023-06-03 MED ORDER — DIPHENHYDRAMINE HCL 50 MG/ML IJ SOLN: 50.0000 mg | Freq: Once | INTRAMUSCULAR | Status: DC | PRN

## 2023-06-03 MED ORDER — VANCOMYCIN HCL IN DEXTROSE 1-5 GM/200ML-% IV SOLN
1000.0000 mg | INTRAVENOUS | Status: AC
  Administered 2023-06-03: 1000 mg via INTRAVENOUS

## 2023-06-03 MED ORDER — MIDAZOLAM HCL 2 MG/2ML IJ SOLN
INTRAMUSCULAR | Status: AC
  Filled 2023-06-03: qty 2

## 2023-06-03 MED ORDER — LIDOCAINE-EPINEPHRINE (PF) 1 %-1:200000 IJ SOLN
INTRAMUSCULAR | Status: DC | PRN
  Administered 2023-06-03: 20 mL

## 2023-06-03 MED ORDER — FAMOTIDINE 20 MG PO TABS: 40.0000 mg | ORAL_TABLET | Freq: Once | ORAL | Status: DC | PRN

## 2023-06-03 MED ORDER — MIDAZOLAM HCL 2 MG/2ML IJ SOLN
INTRAMUSCULAR | Status: AC
Start: 2023-06-03 — End: ?
  Filled 2023-06-03: qty 2

## 2023-06-03 MED ORDER — VANCOMYCIN HCL IN DEXTROSE 1-5 GM/200ML-% IV SOLN
INTRAVENOUS | Status: AC
  Filled 2023-06-03: qty 200

## 2023-06-03 MED ORDER — FENTANYL CITRATE (PF) 100 MCG/2ML IJ SOLN
INTRAMUSCULAR | Status: AC
  Filled 2023-06-03: qty 2

## 2023-06-03 MED ORDER — METHYLPREDNISOLONE SODIUM SUCC 125 MG IJ SOLR: 125.0000 mg | Freq: Once | INTRAMUSCULAR | Status: DC | PRN

## 2023-06-03 SURGICAL SUPPLY — 6 items
DERMABOND ADVANCED .7 DNX12 (GAUZE/BANDAGES/DRESSINGS) IMPLANT
ELECTRODE REM PT RTRN 9FT ADLT (ELECTROSURGICAL) IMPLANT
PACK ANGIOGRAPHY (CUSTOM PROCEDURE TRAY) IMPLANT
PENCIL ELECTRO HAND CTR (MISCELLANEOUS) IMPLANT
SUT MNCRL AB 4-0 PS2 18 (SUTURE) IMPLANT
SUT VIC AB 3-0 CT1 TAPERPNT 27 (SUTURE) IMPLANT

## 2023-06-03 NOTE — Op Note (Signed)
 [place] [facility]       Operative Note  Date: 06/03/2023  Preoperative diagnosis:  1. ITP with thrombocytopenia, no longer using port  Postoperative diagnosis:  Same as above  Procedures: #1. Removal of right jugular port a cath   Surgeon: Mikki Alexander, MD  Anesthesia: Local with moderate conscious sedation for 16 minutes using 3 mg of Versed  and 75 mcg of Fentanyl   Fluoroscopy time: none  Contrast used: 0  Estimated blood loss: Minimal  Indication for the procedure:  The patient is a 33 y.o. male who has completed his treatment for ITP and no longer needs their Port-A-Cath. The patient desires to have this removed. Risks and benefits including need for potential replacement with recurrent disease were discussed and patient is agreeable to proceed.  Description of procedure: The patient was brought to the vascular and interventional radiology suite. Moderate conscious sedation was administered during a face to face encounter with the patient throughout the procedure with my supervision of the RN administering medicines and monitoring the patient's vital signs, pulse oximetry, telemetry and mental status throughout from the start of the procedure until the patient was taken to the recovery room.  The right neck chest and shoulder were sterilely prepped and draped, and a sterile surgical field was created. The area was then anesthetized with 1% lidocaine  copiously. The previous incision was reopened and electrocautery used to dissected down to the port and the catheter. These were dissected free and the catheter was gently removed from the vein in its entirety. The port was dissected out from the fibrous connective tissue and the Prolene sutures were removed. The port was then removed in its entirety including the catheter. The wound was then closed with a 3-0 Vicryl and a 4-0 Monocryl and Dermabond was placed as a dressing. The patient  was then taken to the recovery room in stable condition having tolerated the procedure well.  Complications: none  Condition: stable   Mikki Alexander, MD 06/03/2023 11:58 AM   This note was created with Dragon Medical transcription system. Any errors in dictation are purely unintentional.

## 2023-06-03 NOTE — Discharge Instructions (Signed)
Implanted Port Removal, Care After Refer to this sheet in the next few weeks. These instructions provide you with information about caring for yourself after your procedure. Your health care provider may also give you more specific instructions. Your treatment has been planned according to current medical practices, but problems sometimes occur. Call your health care provider if you have any problems or questions after your procedure. What can I expect after the procedure? After the procedure, it is common to have: Soreness or pain near your incision. Some swelling or bruising near your incision.  Follow these instructions at home: Medicines Take over-the-counter and prescription medicines only as told by your health care provider. If you were prescribed an antibiotic medicine, take it as told by your health care provider. Do not stop taking the antibiotic even if you start to feel better. Bathing You may shower tomorrow.  Incision care You have an incision with sutures that are dissolvable and skin glue over top incision.  This skin glue will wear off over time.  Do not peel or try to remove this yourself. Keep your dressing dry. Check your incision area every day for signs of infection and if present call your doctor and let them know. Check for: More redness, swelling, or pain. More fluid or blood. Warmth. Pus or a bad smell. Driving If you received a sedative, do not drive for 24 hours after the procedure. If you did not receive a sedative, ask your health care provider when it is safe to drive. Activity Return to your normal activities as told by your health care provider. Ask your health care provider what activities are safe for you. Until your health care provider says it is safe: Do not lift anything that is heavier than 10 lb (4.5 kg). Do not do activities that involve lifting your arms over your head. General instructions Do not use any tobacco products, such as cigarettes,  chewing tobacco, and e-cigarettes. Tobacco can delay healing. If you need help quitting, ask your health care provider. Keep all follow-up visits as told by your health care provider. This is important. Contact a health care provider if: You have more redness, swelling, or pain around your incision. You have more fluid or blood coming from your incision. Your incision feels warm to the touch. You have pus or a bad smell coming from your incision. You have a fever. You have pain that is not relieved by your pain medicine. Get help right away if: You have chest pain. You have difficulty breathing. This information is not intended to replace advice given to you by your health care provider. Make sure you discuss any questions you have with your health care provider. Document Released: 01/07/2015 Document Revised: 07/04/2015 Document Reviewed: 10/31/2014 Elsevier Interactive Patient Education  2018 [employer]  

## 2023-06-03 NOTE — H&P (Signed)
 [facility] Admission History & Physical  MRN : [id number]  Samuel Rojas is a 33 y.o. (1990/02/22) male who presents with chief complaint of No chief complaint on file. Aaron Aas  History of Present Illness: Patient presents for removal of his Port-A-Cath.  This was placed approximately 6 years ago for thrombocytopenia with ITP.  He is no longer using the port and desires to have it removed.  It was kept in for a significant amount of time due to poor venous access.  No fevers or chills or signs of infection.  Current Facility-Administered Medications  Medication Dose Route Frequency Provider Last Rate Last Admin   0.9 %  sodium chloride  infusion   Intravenous Continuous Brown, Fallon E, NP 75 mL/hr at 06/03/23 1035 New Bag at 06/03/23 1035   diphenhydrAMINE  (BENADRYL ) injection 50 mg  50 mg Intravenous Once PRN Brown, Fallon E, NP       famotidine  (PEPCID ) tablet 40 mg  40 mg Oral Once PRN Brown, Fallon E, NP       HYDROmorphone  (DILAUDID ) injection 1 mg  1 mg Intravenous Once PRN Brown, Fallon E, NP       methylPREDNISolone  sodium succinate (SOLU-MEDROL ) 125 mg/2 mL injection 125 mg  125 mg Intravenous Once PRN Brown, Fallon E, NP       midazolam  (VERSED ) 2 MG/ML syrup 8 mg  8 mg Oral Once PRN Brown, Fallon E, NP       ondansetron  (ZOFRAN ) injection 4 mg  4 mg Intravenous Q6H PRN Brown, Fallon E, NP       vancomycin  (VANCOCIN ) IVPB 1000 mg/200 mL premix  1,000 mg Intravenous 60 min Pre-Op Brown, Fallon E, NP       Facility-Administered Medications Ordered in Other Encounters  Medication Dose Route Frequency Provider Last Rate Last Admin   heparin  lock flush 100 UNIT/ML injection            heparin  lock flush 100 unit/mL  500 Units Intracatheter Once Finnegan, Timothy J, MD       heparin  lock flush 100 unit/mL  500 Units Intravenous Once Finnegan, Timothy J, MD       heparin  lock flush 100 unit/mL  500 Units Intravenous Once Finnegan, Timothy J, MD       heparin   lock flush 100 unit/mL  500 Units Intravenous Once Finnegan, Timothy J, MD       sodium chloride  flush (NS) 0.9 % injection 10 mL  10 mL Intravenous PRN Finnegan, Timothy J, MD   10 mL at 02/27/20 1450   sodium chloride  flush (NS) 0.9 % injection 10 mL  10 mL Intravenous Once Finnegan, Timothy J, MD       sodium chloride  flush (NS) 0.9 % injection 10 mL  10 mL Intravenous Once Finnegan, Timothy J, MD       sodium chloride  flush (NS) 0.9 % injection 10 mL  10 mL Intravenous PRN Finnegan, Timothy J, MD   10 mL at 12/18/21 1403   sodium chloride  flush (NS) 0.9 % injection 10 mL  10 mL Intravenous PRN Shellie Dials, MD        Past Medical History:  Diagnosis Date   Cellulitis    left leg   Epilepsy (HCC)    Idiopathic thrombocytopenic purpura (ITP) [facility])     Past Surgical History:  Procedure Laterality Date   IR CV LINE INJECTION  04/12/2023   PORTA CATH INSERTION N/A 09/20/2017   Procedure: PORTA CATH INSERTION;  Surgeon:  Celso College, MD;  Location: [facility] INVASIVE CV LAB;  Service: Cardiovascular;  Laterality: N/A;   TONSILLECTOMY       Social History   Tobacco Use   Smoking status: Never   Smokeless tobacco: Never  Vaping Use   Vaping status: Never Used  Substance Use Topics   Alcohol use: No   Drug use: No     Family History  Problem Relation Age of Onset   Hypertension Mother    Diabetes Mother   No bleeding or clotting disorders  Allergies  Allergen Reactions   Augmentin [Amoxicillin-Pot Clavulanate] Hives   Sulfamethoxazole -Trimethoprim      Dehydration, confusion , high BP     REVIEW OF SYSTEMS (Negative unless checked)  Constitutional: [] Weight loss  [] Fever  [] Chills Cardiac: [] Chest pain   [] Chest pressure   [] Palpitations   [] Shortness of breath when laying flat   [] Shortness of breath at rest   [x] Shortness of breath with exertion. Vascular:  [] Pain in legs with walking   [] Pain in legs at rest   [] Pain in legs when laying flat   [] Claudication    [] Pain in feet when walking  [] Pain in feet at rest  [] Pain in feet when laying flat   [] History of DVT   [] Phlebitis   [x] Swelling in legs   [] Varicose veins   [] Non-healing ulcers Pulmonary:   [] Uses home oxygen   [] Productive cough   [] Hemoptysis   [] Wheeze  [] COPD   [] Asthma Neurologic:  [] Dizziness  [] Blackouts   [x] Seizures   [] History of stroke   [] History of TIA  [] Aphasia   [] Temporary blindness   [] Dysphagia   [] Weakness or numbness in arms   [] Weakness or numbness in legs Musculoskeletal:  [] Arthritis   [] Joint swelling   [] Joint pain   [] Low back pain Hematologic:  [x] Easy bruising  [] Easy bleeding   [] Hypercoagulable state   [] Anemic  [] Hepatitis Gastrointestinal:  [] Blood in stool   [] Vomiting blood  [] Gastroesophageal reflux/heartburn   [] Difficulty swallowing. Genitourinary:  [] Chronic kidney disease   [] Difficult urination  [] Frequent urination  [] Burning with urination   [] Blood in urine Skin:  [] Rashes   [] Ulcers   [] Wounds Psychological:  [] History of anxiety   []  History of major depression.  Physical Examination  Vitals:   06/03/23 1016  BP: 130/82  Pulse: 64  Resp: (!) 25  Temp: 98.1 F (36.7 C)  TempSrc: Oral  SpO2: 97%  Weight: (!) 165.6 kg  Height: 5\' 6"  (1.676 m)   Body mass index is 58.93 kg/m. Gen: WD/WN, NAD. Morbidly obese Head: [place]/AT, No temporalis wasting.  Ear/Nose/Throat: Hearing grossly intact, nares w/o erythema or drainage, oropharynx w/o Erythema/Exudate,  Eyes: Conjunctiva clear, sclera non-icteric Neck: Trachea midline.  No JVD.  Pulmonary:  Good air movement, respirations not labored, no use of accessory muscles.  Cardiac: RRR, normal S1, S2. Vascular:  Vessel Right Left  Radial Palpable Palpable               Musculoskeletal: M/S 5/5 throughout.  Extremities without ischemic changes.  No deformity or atrophy.  Neurologic: Sensation grossly intact in extremities.  Symmetrical.  Speech is fluent. Motor exam as listed  above. Psychiatric: Judgment intact, Mood & affect appropriate for pt's clinical situation. Dermatologic: No rashes or ulcers noted.  No cellulitis or open wounds.      CBC Lab Results  Component Value Date   WBC 5.4 05/24/2023   HGB 13.9 05/24/2023   HCT 43.2 05/24/2023   MCV 79.4 (L)  05/24/2023   PLT 120 (L) 05/24/2023    BMET    Component Value Date/Time   NA 136 01/07/2023 0518   NA 136 01/11/2014 1933   K 3.7 01/07/2023 0518   K 3.9 01/11/2014 1933   CL 101 01/07/2023 0518   CL 99 01/11/2014 1933   CO2 26 01/07/2023 0518   CO2 28 01/11/2014 1933   GLUCOSE 130 (H) 01/07/2023 0518   GLUCOSE 103 (H) 01/11/2014 1933   BUN 13 01/07/2023 0518   BUN 19 (H) 01/11/2014 1933   CREATININE 0.91 01/07/2023 0518   CREATININE 1.35 (H) 01/11/2014 1933   CALCIUM 8.6 (L) 01/07/2023 0518   CALCIUM 8.4 (L) 01/11/2014 1933   GFRNONAA >60 01/07/2023 0518   GFRNONAA >60 01/11/2014 1933   GFRNONAA >60 03/06/2013 2249   GFRAA >60 08/26/2019 0325   GFRAA >60 01/11/2014 1933   GFRAA >60 03/06/2013 2249   CrCl cannot be calculated (Patient's most recent lab result is older than the maximum 21 days allowed.).  COAG Lab Results  Component Value Date   INR 1.0 01/07/2023    Radiology No results found.   Assessment/Plan 1.  Thrombocytopenia from ITP.  Status post treatment and now can have his port removed.  Desires to have the port removed and this will be taken out today.    Mikki Alexander, MD  06/03/2023 11:16 AM

## 2023-06-29 ENCOUNTER — Encounter (INDEPENDENT_AMBULATORY_CARE_PROVIDER_SITE_OTHER): Payer: Self-pay

## 2023-08-16 ENCOUNTER — Other Ambulatory Visit: Payer: 59

## 2023-08-23 ENCOUNTER — Other Ambulatory Visit: Payer: 59

## 2023-08-23 ENCOUNTER — Inpatient Hospital Stay: Attending: Oncology

## 2023-08-23 DIAGNOSIS — Z5112 Encounter for antineoplastic immunotherapy: Secondary | ICD-10-CM | POA: Insufficient documentation

## 2023-08-23 DIAGNOSIS — D693 Immune thrombocytopenic purpura: Secondary | ICD-10-CM | POA: Insufficient documentation

## 2023-08-23 LAB — CBC WITH DIFFERENTIAL/PLATELET
Abs Immature Granulocytes: 0.01 K/uL (ref 0.00–0.07)
Basophils Absolute: 0 K/uL (ref 0.0–0.1)
Basophils Relative: 0 %
Eosinophils Absolute: 0.1 K/uL (ref 0.0–0.5)
Eosinophils Relative: 2 %
HCT: 42.2 % (ref 39.0–52.0)
Hemoglobin: 13.8 g/dL (ref 13.0–17.0)
Immature Granulocytes: 0 %
Lymphocytes Relative: 29 %
Lymphs Abs: 1.4 K/uL (ref 0.7–4.0)
MCH: 25.9 pg — ABNORMAL LOW (ref 26.0–34.0)
MCHC: 32.7 g/dL (ref 30.0–36.0)
MCV: 79.2 fL — ABNORMAL LOW (ref 80.0–100.0)
Monocytes Absolute: 0.3 K/uL (ref 0.1–1.0)
Monocytes Relative: 6 %
Neutro Abs: 3 K/uL (ref 1.7–7.7)
Neutrophils Relative %: 63 %
Platelets: 43 K/uL — ABNORMAL LOW (ref 150–400)
RBC: 5.33 MIL/uL (ref 4.22–5.81)
RDW: 14 % (ref 11.5–15.5)
WBC: 4.8 K/uL (ref 4.0–10.5)
nRBC: 0 % (ref 0.0–0.2)

## 2023-08-27 ENCOUNTER — Encounter: Payer: Self-pay | Admitting: Oncology

## 2023-08-27 ENCOUNTER — Inpatient Hospital Stay (HOSPITAL_BASED_OUTPATIENT_CLINIC_OR_DEPARTMENT_OTHER): Admitting: Oncology

## 2023-08-27 DIAGNOSIS — D693 Immune thrombocytopenic purpura: Secondary | ICD-10-CM | POA: Diagnosis not present

## 2023-08-27 NOTE — Progress Notes (Signed)
 It is also curious to me, if things are so bad why does not she have a lawyer? [facility]  [phone] [phone]) [phone]  ID: Samuel Rojas OB: Mar 07, 1990  MR#: [id number]  RDW#:[id number]  Patient Care Team: Center, [facility] as PCP - General (General Practice) Jacobo Evalene PARAS, MD as Consulting Physician (Hematology and Oncology)  I connected with Samuel Rojas on 08/27/23 at 11:15 AM EDT by video enabled telemedicine visit and verified that I am speaking with the correct person using two identifiers.   I discussed the limitations, risks, security and privacy concerns of performing an evaluation and management service by telemedicine and the availability of in-person appointments. I also discussed with the patient that there may be a patient responsible charge related to this service. The patient expressed understanding and agreed to proceed.   Other persons participating in the visit and their role in the encounter: Patient, MD.  Patient's location: Home. Provider's location: Clinic.   CHIEF COMPLAINT: Chronic ITP.  INTERVAL HISTORY: Patient agreed to video-assisted telemedicine visit for further evaluation and discussion of his laboratory results.  He has noticed increased fatigue recently, but otherwise has felt well.  He denies any easy bleeding or bruising. He continues to be active and work full-time. He has no neurologic complaints or recent seizures. He denies any recent fevers or illnesses. He has no chest pain, shortness of breath, cough, or hemoptysis.  He denies any nausea, vomiting, constipation, or diarrhea. He has no urinary complaints.  Patient offers no further specific complaints today.  REVIEW OF SYSTEMS:   Review of Systems  Constitutional:  Positive for malaise/fatigue. Negative for fever and weight loss.  HENT: Negative.  Negative for nosebleeds.   Respiratory: Negative.  Negative for cough,  hemoptysis and shortness of breath.   Cardiovascular: Negative.  Negative for chest pain and leg swelling.  Gastrointestinal: Negative.  Negative for abdominal pain, blood in stool and melena.  Genitourinary: Negative.  Negative for hematuria.  Musculoskeletal: Negative.  Negative for back pain and myalgias.  Skin: Negative.  Negative for rash.  Neurological: Negative.  Negative for sensory change, focal weakness and weakness.  Endo/Heme/Allergies:  Does not bruise/bleed easily.  Psychiatric/Behavioral: Negative.  The patient is not nervous/anxious.     As per HPI. Otherwise, a complete review of systems is negative.  PAST MEDICAL HISTORY: Past Medical History:  Diagnosis Date   Cellulitis    left leg   Epilepsy (HCC)    Idiopathic thrombocytopenic purpura (ITP) (HCC)     PAST SURGICAL HISTORY: Past Surgical History:  Procedure Laterality Date   IR CV LINE INJECTION  04/12/2023   PORTA CATH INSERTION N/A 09/20/2017   Procedure: PORTA CATH INSERTION;  Surgeon: Marea Selinda RAMAN, MD;  Location: [facility] INVASIVE CV LAB;  Service: Cardiovascular;  Laterality: N/A;   PORTA CATH REMOVAL N/A 06/03/2023   Procedure: PORTA CATH REMOVAL;  Surgeon: Marea Selinda RAMAN, MD;  Location: [facility] INVASIVE CV LAB;  Service: Cardiovascular;  Laterality: N/A;   TONSILLECTOMY      FAMILY HISTORY: Family History  Problem Relation Age of Onset   Hypertension Mother    Diabetes Mother        ADVANCED DIRECTIVES:    HEALTH MAINTENANCE: Social History   Tobacco Use   Smoking status: Never   Smokeless tobacco: Never  Vaping Use   Vaping status: Never Used  Substance Use Topics   Alcohol use: No   Drug use: No  Colonoscopy:  PAP:  Bone density:  Lipid panel:  Allergies  Allergen Reactions   Augmentin [Amoxicillin-Pot Clavulanate] Hives   Sulfamethoxazole -Trimethoprim      Dehydration, confusion , high BP    Current Outpatient Medications  Medication Sig Dispense Refill   lamoTRIgine (LAMICTAL  PO) Take 2 capsules by mouth in the morning and at bedtime.     No current facility-administered medications for this visit.   Facility-Administered Medications Ordered in Other Visits  Medication Dose Route Frequency Provider Last Rate Last Admin   heparin  lock flush 100 UNIT/ML injection            heparin  lock flush 100 unit/mL  500 Units Intracatheter Once Jacobo, Kaleah Hagemeister J, MD       heparin  lock flush 100 unit/mL  500 Units Intravenous Once Meshach Perry J, MD       heparin  lock flush 100 unit/mL  500 Units Intravenous Once Jing Howatt J, MD       heparin  lock flush 100 unit/mL  500 Units Intravenous Once Micaylah Bertucci J, MD       sodium chloride  flush (NS) 0.9 % injection 10 mL  10 mL Intravenous PRN Baruc Tugwell J, MD   10 mL at 02/27/20 1450   sodium chloride  flush (NS) 0.9 % injection 10 mL  10 mL Intravenous Once David Towson J, MD       sodium chloride  flush (NS) 0.9 % injection 10 mL  10 mL Intravenous Once Lus Kriegel J, MD       sodium chloride  flush (NS) 0.9 % injection 10 mL  10 mL Intravenous PRN Stephone Gum J, MD   10 mL at 12/18/21 1403   sodium chloride  flush (NS) 0.9 % injection 10 mL  10 mL Intravenous PRN Hawa Henly J, MD        OBJECTIVE: There were no vitals filed for this visit.    There is no height or weight on file to calculate BMI.    ECOG FS:0 - Asymptomatic  General: Well-developed, well-nourished, no acute distress. HEENT: Normocephalic. Neuro: Alert, answering all questions appropriately. Cranial nerves grossly intact. Psych: Normal affect.   LAB RESULTS:  Lab Results  Component Value Date   NA 136 01/07/2023   K 3.7 01/07/2023   CL 101 01/07/2023   CO2 26 01/07/2023   GLUCOSE 130 (H) 01/07/2023   BUN 13 01/07/2023   CREATININE 0.91 01/07/2023   CALCIUM 8.6 (L) 01/07/2023   PROT 7.3 07/30/2020   ALBUMIN 4.0 07/30/2020   AST 23 07/30/2020   ALT 26 07/30/2020   ALKPHOS 77 07/30/2020   BILITOT 0.8  07/30/2020   GFRNONAA >60 01/07/2023   GFRAA >60 08/26/2019    Lab Results  Component Value Date   WBC 4.8 08/23/2023   NEUTROABS 3.0 08/23/2023   HGB 13.8 08/23/2023   HCT 42.2 08/23/2023   MCV 79.2 (L) 08/23/2023   PLT 43 (L) 08/23/2023   Lab Results  Component Value Date   IRON 50 09/09/2015   TIBC 320 09/09/2015   IRONPCTSAT 16 (L) 09/09/2015   Lab Results  Component Value Date   FERRITIN 75 09/09/2015     STUDIES: No results found.  TREATMENT HISTORY: Weekly Rituxan  x4 completed on October 08, 2017 Weekly Rituxan  x4 completed on September 09, 2018 Weekly Rituxan  x4 completed on  March 22, 2020 Weekly Rituxan  x4 completed on May 23, 2021  ASSESSMENT: Chronic ITP.  PLAN:     Chronic ITP: Previously, patient received 5 days  of 100 mg prednisone  with an excellent response to his platelets increasing to greater than 200.  Unfortunately, response was not durable. 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  Splenic ultrasound did not reveal any evidence of splenomegaly.  Bone marrow biopsy was not completed.  Patient has had multiple rounds of weekly Rituxan  x4. See above for patient treatment history.  Typically he has a delayed increase in his platelet count after receiving treatment.  Patient platelet count is dropped to 43 and he is mildly symptomatic with fatigue therefore return to clinic in 1 week to reinitiate treatment with weekly Rituxan  x 4.   Poor venous access: Patient has had his port removed. Nontraumatic rhabdomyolysis: Resolved. Possibly related to recently diagnosed nocturnal seizures brought on by sleep apnea.  I provided 20 minutes of face-to-face video visit time during this encounter which included chart review, counseling, and coordination of care as documented above.     Patient expressed understanding and was in agreement with this plan. He also understands that He can call clinic at any time with any  questions, concerns, or complaints.    Evalene JINNY Reusing, MD   08/27/2023 11:39 AM

## 2023-08-27 NOTE — Progress Notes (Signed)
 Called patient for virtual appointment concerns of SOB, states this is how he feels when his platelets are low

## 2023-08-27 NOTE — Progress Notes (Signed)
 Pharmacist Chemotherapy Monitoring - Initial Assessment    Anticipated start date: 09/03/23   The following has been reviewed per standard work regarding the patient's treatment regimen: The patient's diagnosis, treatment plan and drug doses, and organ/hematologic function Lab orders and baseline tests specific to treatment regimen  The treatment plan start date, drug sequencing, and pre-medications Prior authorization status  Patient's documented medication list, including drug-drug interaction screen and prescriptions for anti-emetics and supportive care specific to the treatment regimen The drug concentrations, fluid compatibility, administration routes, and timing of the medications to be used The patient's access for treatment and lifetime cumulative dose history, if applicable  The patient's medication allergies and previous infusion related reactions, if applicable   Chronic ITP. 4 previous cycles of rituxan  Patient platelet count is dropped to 43 and he is mildly symptomatic return to clinic in 1 week to reinitiate treatment with weekly Rituxan  x 4.  Follow up needed:  Check prior auth   Redell JINNY Gaskins, [facility], 08/27/2023  3:14 PM

## 2023-09-02 ENCOUNTER — Encounter: Payer: Self-pay | Admitting: Oncology

## 2023-09-03 ENCOUNTER — Inpatient Hospital Stay

## 2023-09-03 ENCOUNTER — Inpatient Hospital Stay: Admitting: Nurse Practitioner

## 2023-09-03 ENCOUNTER — Encounter: Payer: Self-pay | Admitting: Nurse Practitioner

## 2023-09-03 ENCOUNTER — Encounter: Payer: Self-pay | Admitting: Oncology

## 2023-09-03 VITALS — BP 138/77 | HR 93 | Temp 98.5°F | Resp 18

## 2023-09-03 VITALS — BP 138/75 | HR 73 | Temp 98.7°F | Resp 17 | Wt 375.0 lb

## 2023-09-03 DIAGNOSIS — D693 Immune thrombocytopenic purpura: Secondary | ICD-10-CM

## 2023-09-03 DIAGNOSIS — T451X5A Adverse effect of antineoplastic and immunosuppressive drugs, initial encounter: Secondary | ICD-10-CM

## 2023-09-03 DIAGNOSIS — Z5112 Encounter for antineoplastic immunotherapy: Secondary | ICD-10-CM | POA: Diagnosis not present

## 2023-09-03 LAB — CBC WITH DIFFERENTIAL/PLATELET
Abs Immature Granulocytes: 0.01 K/uL (ref 0.00–0.07)
Basophils Absolute: 0 K/uL (ref 0.0–0.1)
Basophils Relative: 0 %
Eosinophils Absolute: 0.1 K/uL (ref 0.0–0.5)
Eosinophils Relative: 2 %
HCT: 42.8 % (ref 39.0–52.0)
Hemoglobin: 13.8 g/dL (ref 13.0–17.0)
Immature Granulocytes: 0 %
Lymphocytes Relative: 31 %
Lymphs Abs: 1.4 K/uL (ref 0.7–4.0)
MCH: 25.2 pg — ABNORMAL LOW (ref 26.0–34.0)
MCHC: 32.2 g/dL (ref 30.0–36.0)
MCV: 78.2 fL — ABNORMAL LOW (ref 80.0–100.0)
Monocytes Absolute: 0.2 K/uL (ref 0.1–1.0)
Monocytes Relative: 4 %
Neutro Abs: 2.9 K/uL (ref 1.7–7.7)
Neutrophils Relative %: 63 %
Platelets: 49 K/uL — ABNORMAL LOW (ref 150–400)
RBC: 5.47 MIL/uL (ref 4.22–5.81)
RDW: 14 % (ref 11.5–15.5)
WBC: 4.6 K/uL (ref 4.0–10.5)
nRBC: 0 % (ref 0.0–0.2)

## 2023-09-03 MED ORDER — DIPHENHYDRAMINE HCL 25 MG PO CAPS
50.0000 mg | ORAL_CAPSULE | Freq: Once | ORAL | Status: AC
  Administered 2023-09-03: 50 mg via ORAL
  Filled 2023-09-03: qty 2

## 2023-09-03 MED ORDER — SODIUM CHLORIDE 0.9 % IV SOLN
Freq: Once | INTRAVENOUS | Status: DC | PRN
  Filled 2023-09-03: qty 250

## 2023-09-03 MED ORDER — SODIUM CHLORIDE 0.9 % IV SOLN
INTRAVENOUS | Status: DC
Start: 2023-09-03 — End: 2023-09-03
  Filled 2023-09-03: qty 250

## 2023-09-03 MED ORDER — ACETAMINOPHEN 325 MG PO TABS
650.0000 mg | ORAL_TABLET | Freq: Once | ORAL | Status: AC
  Administered 2023-09-03: 650 mg via ORAL
  Filled 2023-09-03: qty 2

## 2023-09-03 MED ORDER — SODIUM CHLORIDE 0.9 % IV SOLN
375.0000 mg/m2 | Freq: Once | INTRAVENOUS | Status: AC
  Administered 2023-09-03: 1000 mg via INTRAVENOUS
  Filled 2023-09-03: qty 100

## 2023-09-03 MED ORDER — SODIUM CHLORIDE 0.9 % IV SOLN
20.0000 mg | Freq: Once | INTRAVENOUS | Status: AC
  Administered 2023-09-03: 20 mg via INTRAVENOUS
  Filled 2023-09-03: qty 20

## 2023-09-03 MED ORDER — FAMOTIDINE IN NACL 20-0.9 MG/50ML-% IV SOLN
20.0000 mg | Freq: Once | INTRAVENOUS | Status: AC | PRN
  Administered 2023-09-03: 20 mg via INTRAVENOUS

## 2023-09-03 MED ORDER — METHYLPREDNISOLONE SODIUM SUCC 125 MG IJ SOLR
125.0000 mg | Freq: Once | INTRAMUSCULAR | Status: AC | PRN
  Administered 2023-09-03: 125 mg via INTRAVENOUS

## 2023-09-03 NOTE — Progress Notes (Signed)
 1315: Pt reports throat and back are itching. Truxima  paused, NS started, and Tinnie Dawn NP notified. 1320: pt assisted to the BR.  1328: pt reports itching resolved and back to baseline.  1339: Restarted at the previous rate ( 150 mg/hr/52.37ml/hr) per Tinnie Dawn NP . 1408: pt reports feeling hot and sweaty. Truxima  paused, Tinnie Dawn NP notified. Blinds pulled (pt sitting by the window.  Per Tinnie Dawn NP reduce rate back to previous rate (100mg /hr/52ml/hr) for 15 minutes and continue titration if pt stable.  1411: Truxima  restarted at a rate of 35ml/hr.  1426: pt stable, no s/s of distress or reaction noted. Rate increased to 52.5ml/hr. 1446: pt continues to tolerated treatment without s/s of reaction. Pt and VS stable. Per Tinnie Dawn NP okay to titrate up at this time and then continue to follow protocol. Rate increased to 70 ml/hr.  1630: pt continues to tolerate Truxima  well with no s/s of reaction or distress. Pt stable.  Report given to Roderick MOTE RN and Sydell EDISON RN.

## 2023-09-03 NOTE — Progress Notes (Signed)
   Symptom Management Clinic  [facility] Department of the [facility]. [facility] [address], [address] [place], [place] [address] [phone] (phone) [phone] (fax)  Patient Care Team: Center, [facility] as PCP - General (General Practice) Jacobo Evalene PARAS, MD as Consulting Physician (Oncology)   Name of the patient: Samuel Rojas  [id number]  [birth year]   Date of visit: 09/03/23  Diagnosis- ITP  Chief complaint/ Reason for visit- infusion reaction  Heme/Onc history:  Oncology History   No history exists.   Interval history- called to infusion clinic for complaints of itching all over while patient receiving rituximab . Pre-meds reviewed.   Review of systems- Review of Systems  Respiratory:  Negative for cough and shortness of breath.   Cardiovascular:  Negative for chest pain.  Skin:  Positive for itching. Negative for rash.    Current treatment- rituximab   Allergies  Allergen Reactions   Augmentin [Amoxicillin-Pot Clavulanate] Hives   Sulfamethoxazole -Trimethoprim      Dehydration, confusion , high BP   Past Medical History:  Diagnosis Date   Cellulitis    left leg   Epilepsy (HCC)    Idiopathic thrombocytopenic purpura (ITP) (HCC)    Physical exam: There were no vitals filed for this visit. Physical Exam Constitutional:      Appearance: He is not ill-appearing.  Cardiovascular:     Rate and Rhythm: Normal rate.  Pulmonary:     Effort: No respiratory distress.     Breath sounds: No stridor.  Skin:    Findings: No rash.  Neurological:     Mental Status: He is alert and oriented to person, place, and time.  Psychiatric:        Mood and Affect: Mood normal.        Behavior: Behavior normal.     Assessment and plan- Patient is a 33 y.o. male   Reaction to rituximab - infusion held. Solumedrol given. Fluids given. Symptoms resolved. Restarted at one rate lower than  when he reacted. He was able to tolerate w/o incident.  Called to infusion again- patient complaining of feeling 'hot all over'. Infusion decreased to previously tolerated dose. No meds given. Able to tolerate. Plan to increase rate after 15 minutes.  Expect rate based reaction. Plan to continue.    Visit Diagnosis 1. Adverse effect of rituximab  infusion    Patient expressed understanding and was in agreement with this plan. He also understands that He can call clinic at any time with any questions, concerns, or complaints.   Thank you for allowing me to participate in the care of this very pleasant patient.   Tinnie Dawn, DNP, AGNP-C, AOCNP [facility] [phone]

## 2023-09-03 NOTE — Progress Notes (Signed)
 [facility]   Telephone:(336) [phone] Fax:([phone]    Patient Care Team: Center, [facility] as PCP - General (General Practice) Jacobo Evalene PARAS, MD as Consulting Physician (Hematology and Oncology)   CHIEF COMPLAINT: Follow up ITP  CURRENT THERAPY: Restart weekly Rituxan  x4   INTERVAL HISTORY [name] returns for follow up and to restart Rituxan . Feeling well overall. Denies recent infection, bruising/bleeding, any pain, or new/specific complaints.   ROS  All other systems reviewed and negative  Past Medical History:  Diagnosis Date   Cellulitis    left leg   Epilepsy (HCC)    Idiopathic thrombocytopenic purpura (ITP) (HCC)      Past Surgical History:  Procedure Laterality Date   IR CV LINE INJECTION  04/12/2023   PORTA CATH INSERTION N/A 09/20/2017   Procedure: PORTA CATH INSERTION;  Surgeon: Marea Selinda RAMAN, MD;  Location: [facility] INVASIVE CV LAB;  Service: Cardiovascular;  Laterality: N/A;   PORTA CATH REMOVAL N/A 06/03/2023   Procedure: PORTA CATH REMOVAL;  Surgeon: Marea Selinda RAMAN, MD;  Location: [facility] INVASIVE CV LAB;  Service: Cardiovascular;  Laterality: N/A;   TONSILLECTOMY       Outpatient Encounter Medications as of 09/03/2023  Medication Sig   lamoTRIgine (LAMICTAL PO) Take 2 capsules by mouth in the morning and at bedtime.   Facility-Administered Encounter Medications as of 09/03/2023  Medication   heparin  lock flush 100 UNIT/ML injection   heparin  lock flush 100 unit/mL   heparin  lock flush 100 unit/mL   heparin  lock flush 100 unit/mL   heparin  lock flush 100 unit/mL   sodium chloride  flush (NS) 0.9 % injection 10 mL   sodium chloride  flush (NS) 0.9 % injection 10 mL   sodium chloride  flush (NS) 0.9 % injection 10 mL   sodium chloride  flush (NS) 0.9 % injection 10 mL   sodium chloride  flush (NS) 0.9 % injection 10 mL     Today's Vitals   09/03/23 0931 09/03/23 0934 09/03/23 0936  BP:  (!) 148/78 138/75   Pulse:  78 73  Resp:  17   Temp:  98.7 F (37.1 C)   TempSrc:  Tympanic   SpO2:  98%   Weight:  (!) 375 lb (170.1 kg)   PainSc: 0-No pain     Body mass index is 60.53 kg/m.   ECOG PERFORMANCE STATUS: 0 - Asymptomatic  PHYSICAL EXAM GENERAL:alert, no distress and comfortable SKIN: no rash or ecchymosis  EYES: sclera clear NECK: without mass LUNGS: clear with normal breathing effort HEART: regular rate & rhythm, left LLE larger diameter than right at baseline ABDOMEN: abdomen soft, non-tender and normal bowel sounds NEURO: alert & oriented x 3 with fluent speech, nonfocal     CBC    Latest Ref Rng & Units 09/03/2023    8:42 AM 08/23/2023   11:09 AM 05/24/2023   11:45 AM  CBC  WBC 4.0 - 10.5 K/uL 4.6  4.8  5.4   Hemoglobin 13.0 - 17.0 g/dL 86.1  86.1  86.0   Hematocrit 39.0 - 52.0 % 42.8  42.2  43.2   Platelets 150 - 400 K/uL 49  43  120         ASSESSMENT & PLAN: 33 year old male   Chronic ITP -splenic US  negative for splenomegaly  -Refractory to steroids, response is not durable  TREATMENT HISTORY: -Weekly Rituxan  x4 completed on October 08, 2017 -Weekly Rituxan  x4 completed on September 09, 2018 -Weekly Rituxan  x4 completed on  March 22, 2020 -Weekly Rituxan  x4 completed on May 23, 2021 -Response to Rituxan  is typically delayed -Recommendation is to resume weekly x4 per Dr. Jacobo    Disposition:  Samuel Rojas appears stable. No bruising/bleeding. Plt 49K today. He agrees to restart weekly rituxan  x4 for relapsed ITP. Given his history of infusion reactions, will pre-medicate with tylenol /benadryl /decadron .  He will return for lab and f/up with week 2 on 8/1 as scheduled.    All questions were answered. The patient knows to call the clinic with any problems, questions or concerns. No barriers to learning were detected. I spent 20 minutes counseling the patient face to face. The total time spent in the appointment was 30 minutes and more than 50% was on  counseling, review of test results, and coordination of care.   Zaidin Blyden K Rikia Sukhu, NP 09/03/2023

## 2023-09-03 NOTE — Progress Notes (Signed)
 Patient here for oncology follow-up appointment, expresses no new concerns at this time.

## 2023-09-04 LAB — HEPATITIS B CORE ANTIBODY, TOTAL: HEP B CORE AB: NEGATIVE

## 2023-09-06 ENCOUNTER — Telehealth: Payer: 59 | Admitting: Oncology

## 2023-09-07 ENCOUNTER — Other Ambulatory Visit: Payer: Self-pay | Admitting: Oncology

## 2023-09-10 ENCOUNTER — Inpatient Hospital Stay: Attending: Oncology

## 2023-09-10 ENCOUNTER — Encounter: Payer: Self-pay | Admitting: Oncology

## 2023-09-10 ENCOUNTER — Inpatient Hospital Stay

## 2023-09-10 ENCOUNTER — Inpatient Hospital Stay (HOSPITAL_BASED_OUTPATIENT_CLINIC_OR_DEPARTMENT_OTHER): Admitting: Oncology

## 2023-09-10 VITALS — BP 126/83 | HR 81 | Temp 97.8°F

## 2023-09-10 VITALS — BP 138/90 | HR 58 | Temp 97.8°F | Resp 18 | Wt 375.0 lb

## 2023-09-10 DIAGNOSIS — Z5112 Encounter for antineoplastic immunotherapy: Secondary | ICD-10-CM | POA: Diagnosis present

## 2023-09-10 DIAGNOSIS — D693 Immune thrombocytopenic purpura: Secondary | ICD-10-CM

## 2023-09-10 LAB — CBC WITH DIFFERENTIAL/PLATELET
Abs Immature Granulocytes: 0.01 K/uL (ref 0.00–0.07)
Basophils Absolute: 0 K/uL (ref 0.0–0.1)
Basophils Relative: 0 %
Eosinophils Absolute: 0.1 K/uL (ref 0.0–0.5)
Eosinophils Relative: 3 %
HCT: 42.8 % (ref 39.0–52.0)
Hemoglobin: 13.7 g/dL (ref 13.0–17.0)
Immature Granulocytes: 0 %
Lymphocytes Relative: 19 %
Lymphs Abs: 1.1 K/uL (ref 0.7–4.0)
MCH: 25.2 pg — ABNORMAL LOW (ref 26.0–34.0)
MCHC: 32 g/dL (ref 30.0–36.0)
MCV: 78.7 fL — ABNORMAL LOW (ref 80.0–100.0)
Monocytes Absolute: 0.3 K/uL (ref 0.1–1.0)
Monocytes Relative: 5 %
Neutro Abs: 4.1 K/uL (ref 1.7–7.7)
Neutrophils Relative %: 73 %
Platelets: 46 K/uL — ABNORMAL LOW (ref 150–400)
RBC: 5.44 MIL/uL (ref 4.22–5.81)
RDW: 14.4 % (ref 11.5–15.5)
WBC: 5.7 K/uL (ref 4.0–10.5)
nRBC: 0 % (ref 0.0–0.2)

## 2023-09-10 LAB — MISC LABCORP TEST (SEND OUT): Labcorp test code: 6510

## 2023-09-10 MED ORDER — SODIUM CHLORIDE 0.9 % IV SOLN
375.0000 mg/m2 | Freq: Once | INTRAVENOUS | Status: AC
  Administered 2023-09-10: 1000 mg via INTRAVENOUS
  Filled 2023-09-10: qty 100

## 2023-09-10 MED ORDER — SODIUM CHLORIDE 0.9 % IV SOLN
INTRAVENOUS | Status: DC
  Filled 2023-09-10 (×2): qty 250

## 2023-09-10 MED ORDER — ACETAMINOPHEN 325 MG PO TABS
650.0000 mg | ORAL_TABLET | Freq: Once | ORAL | Status: AC
  Administered 2023-09-10: 650 mg via ORAL
  Filled 2023-09-10: qty 2

## 2023-09-10 MED ORDER — FAMOTIDINE IN NACL 20-0.9 MG/50ML-% IV SOLN
20.0000 mg | Freq: Once | INTRAVENOUS | Status: AC
  Administered 2023-09-10: 20 mg via INTRAVENOUS
  Filled 2023-09-10: qty 50

## 2023-09-10 MED ORDER — DEXAMETHASONE SODIUM PHOSPHATE 10 MG/ML IJ SOLN
10.0000 mg | Freq: Once | INTRAMUSCULAR | Status: AC
  Administered 2023-09-10: 10 mg via INTRAVENOUS
  Filled 2023-09-10: qty 1

## 2023-09-10 MED ORDER — DIPHENHYDRAMINE HCL 25 MG PO CAPS
50.0000 mg | ORAL_CAPSULE | Freq: Once | ORAL | Status: AC
Start: 2023-09-10 — End: 2023-09-10
  Administered 2023-09-10: 50 mg via ORAL
  Filled 2023-09-10: qty 2

## 2023-09-10 NOTE — Progress Notes (Signed)
 Rapid Infusion Rituximab  Pharmacist Evaluation  Samuel Rojas is a 33 y.o. male being treated with rituximab  for 09/10/23. This patient may not be considered for RIR.   A pharmacist has verified the patient tolerated rituximab  infusions per the [facility] standard infusion protocol without grade 3-4 infusion reactions. The treatment plan will be updated to reflect RIR if the patient qualifies per the checklist below:   Age > 92 years old yes  Clinically significant cardiovascular disease No   Circulating lymphocyte count < 5000/uL prior to cycle two Yes  Lab Results  Component Value Date   LYMPHSABS 1.1 09/10/2023    Prior documented grade 3-4 infusion reaction to rituximab  Yes   Prior documented grade 1-2 infusion reaction to rituximab  (If YES, Pharmacist will confirm with Physician if patient is still a candidate for RIR) Yes   Previous rituximab  infusion within the past 6 months No   Treatment Plan updated orders to reflect RIR No    Samuel Rojas /does not meet the criteria for Rapid Infusion Rituximab . This patient is not going to be switched to rapid infusion rituximab .  Patient has had reactions in the past and is premedicated with steroids, and H2 blocker and antihistamine   Samuel Rojas J Keric Zehren 09/10/23 10:13 AM

## 2023-09-10 NOTE — Progress Notes (Signed)
 It is also curious to me, if things are so bad why does not she have a lawyer? [facility]  Telephone:(336) [phone] Fax:(336) [phone]  ID: Samuel Rojas OB: 1990/05/10  MR#: [id number]  RDW#:[id number]  Patient Care Team: Center, [facility] as PCP - General (General Practice) Jacobo Evalene PARAS, MD as Consulting Physician (Oncology)  CHIEF COMPLAINT: Chronic ITP.  INTERVAL HISTORY: Patient returns to clinic today for further evaluation and consideration of cycle 2 of 4 weekly Rituxan .  He had a mild reaction with his first treatment, but was still able to complete.  He currently feels well and is asymptomatic.  He does not complain of any fatigue today.  He continues to be active and work full-time. He has no neurologic complaints or recent seizures. He denies any recent fevers or illnesses. He has no chest pain, shortness of breath, cough, or hemoptysis.  He denies any nausea, vomiting, constipation, or diarrhea. He has no urinary complaints.  Patient offers no specific complaints today.  REVIEW OF SYSTEMS:   Review of Systems  Constitutional: Negative.  Negative for fever, malaise/fatigue and weight loss.  HENT: Negative.  Negative for nosebleeds.   Respiratory: Negative.  Negative for cough, hemoptysis and shortness of breath.   Cardiovascular: Negative.  Negative for chest pain and leg swelling.  Gastrointestinal: Negative.  Negative for abdominal pain, blood in stool and melena.  Genitourinary: Negative.  Negative for hematuria.  Musculoskeletal: Negative.  Negative for back pain and myalgias.  Skin: Negative.  Negative for rash.  Neurological: Negative.  Negative for sensory change, focal weakness and weakness.  Endo/Heme/Allergies:  Does not bruise/bleed easily.  Psychiatric/Behavioral: Negative.  The patient is not nervous/anxious.     As per HPI. Otherwise, a complete review of systems is negative.  PAST MEDICAL HISTORY: Past  Medical History:  Diagnosis Date   Cellulitis    left leg   Epilepsy (HCC)    Idiopathic thrombocytopenic purpura (ITP) (HCC)     PAST SURGICAL HISTORY: Past Surgical History:  Procedure Laterality Date   IR CV LINE INJECTION  04/12/2023   PORTA CATH INSERTION N/A 09/20/2017   Procedure: PORTA CATH INSERTION;  Surgeon: Marea Selinda RAMAN, MD;  Location: [facility] INVASIVE CV LAB;  Service: Cardiovascular;  Laterality: N/A;   PORTA CATH REMOVAL N/A 06/03/2023   Procedure: PORTA CATH REMOVAL;  Surgeon: Marea Selinda RAMAN, MD;  Location: [facility] INVASIVE CV LAB;  Service: Cardiovascular;  Laterality: N/A;   TONSILLECTOMY      FAMILY HISTORY: Family History  Problem Relation Age of Onset   Hypertension Mother    Diabetes Mother        ADVANCED DIRECTIVES:    HEALTH MAINTENANCE: Social History   Tobacco Use   Smoking status: Never   Smokeless tobacco: Never  Vaping Use   Vaping status: Never Used  Substance Use Topics   Alcohol use: No   Drug use: No     Colonoscopy:  PAP:  Bone density:  Lipid panel:  Allergies  Allergen Reactions   Augmentin [Amoxicillin-Pot Clavulanate] Hives   Sulfamethoxazole -Trimethoprim      Dehydration, confusion , high BP    Current Outpatient Medications  Medication Sig Dispense Refill   lamoTRIgine (LAMICTAL PO) Take 2 capsules by mouth in the morning and at bedtime.     No current facility-administered medications for this visit.   Facility-Administered Medications Ordered in Other Visits  Medication Dose Route Frequency Provider Last Rate Last Admin   0.9 %  sodium chloride  infusion   Intravenous Continuous Geraldine Sandberg J, MD 5 mL/hr at 09/10/23 1015 Infusion Verify at 09/10/23 1015   heparin  lock flush 100 UNIT/ML injection            heparin  lock flush 100 unit/mL  500 Units Intracatheter Once Chelse Matas J, MD       heparin  lock flush 100 unit/mL  500 Units Intravenous Once Karas Pickerill J, MD       heparin  lock flush 100 unit/mL   500 Units Intravenous Once Nilza Eaker J, MD       heparin  lock flush 100 unit/mL  500 Units Intravenous Once Amberlynn Tempesta J, MD       sodium chloride  flush (NS) 0.9 % injection 10 mL  10 mL Intravenous PRN Peace Noyes J, MD   10 mL at 02/27/20 1450   sodium chloride  flush (NS) 0.9 % injection 10 mL  10 mL Intravenous Once Demetrice Amstutz J, MD       sodium chloride  flush (NS) 0.9 % injection 10 mL  10 mL Intravenous Once Terralyn Matsumura J, MD       sodium chloride  flush (NS) 0.9 % injection 10 mL  10 mL Intravenous PRN Daleah Coulson J, MD   10 mL at 12/18/21 1403   sodium chloride  flush (NS) 0.9 % injection 10 mL  10 mL Intravenous PRN Raissa Dam J, MD        OBJECTIVE: Vitals:   09/10/23 0907  BP: (!) 138/90  Pulse: (!) 58  Resp: 18  Temp: 97.8 F (36.6 C)  SpO2: 100%      Body mass index is 60.53 kg/m.    ECOG FS:0 - Asymptomatic  General: Well-developed, well-nourished, no acute distress. Eyes: Pink conjunctiva, anicteric sclera. HEENT: Normocephalic, moist mucous membranes. Lungs: No audible wheezing or coughing. Heart: Regular rate and rhythm. Abdomen: Soft, nontender, no obvious distention. Musculoskeletal: No edema, cyanosis, or clubbing. Neuro: Alert, answering all questions appropriately. Cranial nerves grossly intact. Skin: No rashes or petechiae noted. Psych: Normal affect.  LAB RESULTS:  Lab Results  Component Value Date   NA 136 01/07/2023   K 3.7 01/07/2023   CL 101 01/07/2023   CO2 26 01/07/2023   GLUCOSE 130 (H) 01/07/2023   BUN 13 01/07/2023   CREATININE 0.91 01/07/2023   CALCIUM 8.6 (L) 01/07/2023   PROT 7.3 07/30/2020   ALBUMIN 4.0 07/30/2020   AST 23 07/30/2020   ALT 26 07/30/2020   ALKPHOS 77 07/30/2020   BILITOT 0.8 07/30/2020   GFRNONAA >60 01/07/2023   GFRAA >60 08/26/2019    Lab Results  Component Value Date   WBC 5.7 09/10/2023   NEUTROABS 4.1 09/10/2023   HGB 13.7 09/10/2023   HCT 42.8 09/10/2023    MCV 78.7 (L) 09/10/2023   PLT 46 (L) 09/10/2023   Lab Results  Component Value Date   IRON 50 09/09/2015   TIBC 320 09/09/2015   IRONPCTSAT 16 (L) 09/09/2015   Lab Results  Component Value Date   FERRITIN 75 09/09/2015     STUDIES: No results found.  TREATMENT HISTORY: Weekly Rituxan  x4 completed on October 08, 2017 Weekly Rituxan  x4 completed on September 09, 2018 Weekly Rituxan  x4 completed on  March 22, 2020 Weekly Rituxan  x4 completed on May 23, 2021  ASSESSMENT: Chronic ITP.  PLAN:     Chronic ITP: Previously, patient received 5 days of 100 mg prednisone  with an excellent response to his platelets increasing to greater than 200.  Unfortunately, response was not durable. 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  Splenic ultrasound did not reveal any evidence of splenomegaly.  Bone marrow biopsy was not completed.  Patient has had multiple rounds of weekly Rituxan  x4. See above for patient treatment history.  Typically he has a delayed increase in his platelet count after receiving treatment.  Proceed with cycle 2 of 4 of weekly Rituxan  today.  Return to clinic in 1 week for further evaluation and consideration of cycle 3.    Poor venous access: Patient has had his port removed. Nontraumatic rhabdomyolysis: Resolved. Possibly related to recently diagnosed nocturnal seizures brought on by sleep apnea.  I spent a total of 30 minutes reviewing chart data, face-to-face evaluation with the patient, counseling and coordination of care as detailed above.   Patient expressed understanding and was in agreement with this plan. He also understands that He can call clinic at any time with any questions, concerns, or complaints.    Evalene JINNY Reusing, MD   09/10/2023 1:48 PM

## 2023-09-10 NOTE — Progress Notes (Signed)
 Patient is doing well, no new questions or concerns for the doctor today.

## 2023-09-17 ENCOUNTER — Inpatient Hospital Stay

## 2023-09-17 ENCOUNTER — Encounter: Payer: Self-pay | Admitting: Oncology

## 2023-09-17 ENCOUNTER — Inpatient Hospital Stay: Admitting: Oncology

## 2023-09-17 VITALS — BP 134/86 | HR 70 | Temp 98.2°F | Resp 18 | Wt 374.2 lb

## 2023-09-17 VITALS — BP 141/79 | HR 65 | Temp 98.6°F | Resp 19

## 2023-09-17 DIAGNOSIS — Z5112 Encounter for antineoplastic immunotherapy: Secondary | ICD-10-CM | POA: Diagnosis not present

## 2023-09-17 DIAGNOSIS — D693 Immune thrombocytopenic purpura: Secondary | ICD-10-CM

## 2023-09-17 LAB — CBC WITH DIFFERENTIAL/PLATELET
Abs Immature Granulocytes: 0.02 K/uL (ref 0.00–0.07)
Basophils Absolute: 0 K/uL (ref 0.0–0.1)
Basophils Relative: 0 %
Eosinophils Absolute: 0.1 K/uL (ref 0.0–0.5)
Eosinophils Relative: 2 %
HCT: 41.3 % (ref 39.0–52.0)
Hemoglobin: 13.2 g/dL (ref 13.0–17.0)
Immature Granulocytes: 0 %
Lymphocytes Relative: 18 %
Lymphs Abs: 1.3 K/uL (ref 0.7–4.0)
MCH: 25.2 pg — ABNORMAL LOW (ref 26.0–34.0)
MCHC: 32 g/dL (ref 30.0–36.0)
MCV: 78.8 fL — ABNORMAL LOW (ref 80.0–100.0)
Monocytes Absolute: 0.4 K/uL (ref 0.1–1.0)
Monocytes Relative: 5 %
Neutro Abs: 5.3 K/uL (ref 1.7–7.7)
Neutrophils Relative %: 75 %
Platelets: 6 K/uL — CL (ref 150–400)
RBC: 5.24 MIL/uL (ref 4.22–5.81)
RDW: 14 % (ref 11.5–15.5)
WBC: 7.1 K/uL (ref 4.0–10.5)
nRBC: 0 % (ref 0.0–0.2)

## 2023-09-17 MED ORDER — FAMOTIDINE IN NACL 20-0.9 MG/50ML-% IV SOLN
20.0000 mg | Freq: Once | INTRAVENOUS | Status: AC
  Administered 2023-09-17: 20 mg via INTRAVENOUS
  Filled 2023-09-17: qty 50

## 2023-09-17 MED ORDER — DEXAMETHASONE SODIUM PHOSPHATE 10 MG/ML IJ SOLN
10.0000 mg | Freq: Once | INTRAMUSCULAR | Status: AC
  Administered 2023-09-17: 10 mg via INTRAVENOUS
  Filled 2023-09-17: qty 1

## 2023-09-17 MED ORDER — SODIUM CHLORIDE 0.9 % IV SOLN
375.0000 mg/m2 | Freq: Once | INTRAVENOUS | Status: AC
  Administered 2023-09-17: 1000 mg via INTRAVENOUS
  Filled 2023-09-17: qty 100

## 2023-09-17 MED ORDER — SODIUM CHLORIDE 0.9 % IV SOLN
INTRAVENOUS | Status: DC
  Filled 2023-09-17: qty 250

## 2023-09-17 MED ORDER — PREDNISONE 50 MG PO TABS: 100.0000 mg | ORAL_TABLET | Freq: Every day | ORAL | 0 refills | Status: AC

## 2023-09-17 MED ORDER — DIPHENHYDRAMINE HCL 25 MG PO CAPS
50.0000 mg | ORAL_CAPSULE | Freq: Once | ORAL | Status: AC
Start: 2023-09-17 — End: 2023-09-17
  Administered 2023-09-17: 50 mg via ORAL
  Filled 2023-09-17: qty 2

## 2023-09-17 MED ORDER — ACETAMINOPHEN 325 MG PO TABS
650.0000 mg | ORAL_TABLET | Freq: Once | ORAL | Status: AC
  Administered 2023-09-17: 650 mg via ORAL
  Filled 2023-09-17: qty 2

## 2023-09-17 NOTE — Progress Notes (Signed)
 Patient has no complaints today. He states that he feels pretty normal.

## 2023-09-17 NOTE — Progress Notes (Signed)
 [facility]  Telephone:(336) [phone] Fax:(336) [phone]  ID: Samuel Rojas OB: 1990/02/15  MR#: [id number]  RDW#:[id number]  Patient Care Team: Center, [facility] as PCP - General (General Practice) Jacobo Evalene PARAS, MD as Consulting Physician (Oncology)  CHIEF COMPLAINT: Chronic ITP.  INTERVAL HISTORY: Patient returns to clinic today for further evaluation and consideration of cycle 3 of 4 weekly Rituxan .  Currently feels well and is asymptomatic.  He does not complain of any weakness or fatigue.  He denies any easy bruising.  He continues to be active and work full-time. He has no neurologic complaints or recent seizures. He denies any recent fevers or illnesses. He has no chest pain, shortness of breath, cough, or hemoptysis.  He denies any nausea, vomiting, constipation, or diarrhea. He has no urinary complaints.  Patient offers no specific complaints today.  REVIEW OF SYSTEMS:   Review of Systems  Constitutional: Negative.  Negative for fever, malaise/fatigue and weight loss.  HENT: Negative.  Negative for nosebleeds.   Respiratory: Negative.  Negative for cough, hemoptysis and shortness of breath.   Cardiovascular: Negative.  Negative for chest pain and leg swelling.  Gastrointestinal: Negative.  Negative for abdominal pain, blood in stool and melena.  Genitourinary: Negative.  Negative for hematuria.  Musculoskeletal: Negative.  Negative for back pain and myalgias.  Skin: Negative.  Negative for rash.  Neurological: Negative.  Negative for sensory change, focal weakness and weakness.  Endo/Heme/Allergies:  Does not bruise/bleed easily.  Psychiatric/Behavioral: Negative.  The patient is not nervous/anxious.     As per HPI. Otherwise, a complete review of systems is negative.  PAST MEDICAL HISTORY: Past Medical History:  Diagnosis Date   Cellulitis    left leg   Epilepsy (HCC)    Idiopathic thrombocytopenic purpura (ITP)  (HCC)     PAST SURGICAL HISTORY: Past Surgical History:  Procedure Laterality Date   IR CV LINE INJECTION  04/12/2023   PORTA CATH INSERTION N/A 09/20/2017   Procedure: PORTA CATH INSERTION;  Surgeon: Marea Selinda RAMAN, MD;  Location: [facility] INVASIVE CV LAB;  Service: Cardiovascular;  Laterality: N/A;   PORTA CATH REMOVAL N/A 06/03/2023   Procedure: PORTA CATH REMOVAL;  Surgeon: Marea Selinda RAMAN, MD;  Location: [facility] INVASIVE CV LAB;  Service: Cardiovascular;  Laterality: N/A;   TONSILLECTOMY      FAMILY HISTORY: Family History  Problem Relation Age of Onset   Hypertension Mother    Diabetes Mother        ADVANCED DIRECTIVES:    HEALTH MAINTENANCE: Social History   Tobacco Use   Smoking status: Never   Smokeless tobacco: Never  Vaping Use   Vaping status: Never Used  Substance Use Topics   Alcohol use: No   Drug use: No     Colonoscopy:  PAP:  Bone density:  Lipid panel:  Allergies  Allergen Reactions   Augmentin [Amoxicillin-Pot Clavulanate] Hives   Sulfamethoxazole -Trimethoprim      Dehydration, confusion , high BP    Current Outpatient Medications  Medication Sig Dispense Refill   lamoTRIgine (LAMICTAL PO) Take 2 capsules by mouth in the morning and at bedtime.     predniSONE  (DELTASONE ) 50 MG tablet Take 2 tablets (100 mg total) by mouth daily with breakfast. 14 tablet 0   No current facility-administered medications for this visit.   Facility-Administered Medications Ordered in Other Visits  Medication Dose Route Frequency Provider Last Rate Last Admin   0.9 %  sodium chloride  infusion   Intravenous Continuous  Jacobo Evalene PARAS, MD   Stopped at 09/17/23 1043   heparin  lock flush 100 UNIT/ML injection            heparin  lock flush 100 unit/mL  500 Units Intracatheter Once Kyra Laffey J, MD       heparin  lock flush 100 unit/mL  500 Units Intravenous Once Jozalyn Baglio J, MD       heparin  lock flush 100 unit/mL  500 Units Intravenous Once Virlee Stroschein  J, MD       heparin  lock flush 100 unit/mL  500 Units Intravenous Once Doaa Kendzierski J, MD       sodium chloride  flush (NS) 0.9 % injection 10 mL  10 mL Intravenous PRN Korynne Dols J, MD   10 mL at 02/27/20 1450   sodium chloride  flush (NS) 0.9 % injection 10 mL  10 mL Intravenous Once Gaither Biehn J, MD       sodium chloride  flush (NS) 0.9 % injection 10 mL  10 mL Intravenous Once Keyron Pokorski J, MD       sodium chloride  flush (NS) 0.9 % injection 10 mL  10 mL Intravenous PRN Rodrigo Mcgranahan J, MD   10 mL at 12/18/21 1403   sodium chloride  flush (NS) 0.9 % injection 10 mL  10 mL Intravenous PRN Jacobo Evalene PARAS, MD        OBJECTIVE: Vitals:   09/17/23 0900  BP: 134/86  Pulse: 70  Resp: 18  Temp: 98.2 F (36.8 C)  SpO2: 100%       Body mass index is 60.4 kg/m.    ECOG FS:0 - Asymptomatic  General: Well-developed, well-nourished, no acute distress. Eyes: Pink conjunctiva, anicteric sclera. HEENT: Normocephalic, moist mucous membranes. Lungs: No audible wheezing or coughing. Heart: Regular rate and rhythm. Abdomen: Soft, nontender, no obvious distention. Musculoskeletal: No edema, cyanosis, or clubbing. Neuro: Alert, answering all questions appropriately. Cranial nerves grossly intact. Skin: No rashes or petechiae noted. Psych: Normal affect.  LAB RESULTS:  Lab Results  Component Value Date   NA 136 01/07/2023   K 3.7 01/07/2023   CL 101 01/07/2023   CO2 26 01/07/2023   GLUCOSE 130 (H) 01/07/2023   BUN 13 01/07/2023   CREATININE 0.91 01/07/2023   CALCIUM 8.6 (L) 01/07/2023   PROT 7.3 07/30/2020   ALBUMIN 4.0 07/30/2020   AST 23 07/30/2020   ALT 26 07/30/2020   ALKPHOS 77 07/30/2020   BILITOT 0.8 07/30/2020   GFRNONAA >60 01/07/2023   GFRAA >60 08/26/2019    Lab Results  Component Value Date   WBC 7.1 09/17/2023   NEUTROABS 5.3 09/17/2023   HGB 13.2 09/17/2023   HCT 41.3 09/17/2023   MCV 78.8 (L) 09/17/2023   PLT 6 (LL) 09/17/2023    Lab Results  Component Value Date   IRON 50 09/09/2015   TIBC 320 09/09/2015   IRONPCTSAT 16 (L) 09/09/2015   Lab Results  Component Value Date   FERRITIN 75 09/09/2015     STUDIES: No results found.  TREATMENT HISTORY: Weekly Rituxan  x4 completed on October 08, 2017 Weekly Rituxan  x4 completed on September 09, 2018 Weekly Rituxan  x4 completed on  March 22, 2020 Weekly Rituxan  x4 completed on May 23, 2021  ASSESSMENT: Chronic ITP.  PLAN:     Chronic ITP: Previously, patient received 5 days of 100 mg prednisone  with an excellent response to his platelets increasing to greater than 200.  Unfortunately, response was not durable. Previously, his entire work-up was negative therefore  the most likely diagnosis is ITP.  Splenic ultrasound did not reveal any evidence of splenomegaly.  Bone marrow biopsy was not completed.  Patient has had multiple rounds of weekly Rituxan  x4. See above for patient treatment history.  Typically he has a delayed increase in his platelet count after receiving treatment.  Patient is platelets have dropped to 6, but we will proceed with cycle 3 of 4 weekly Rituxan  today.  Patient also received a 7-day course of 100 mg prednisone  daily.  Return to clinic Monday for laboratory work and possible platelet transfusion on Tuesday.  Patient will then return to clinic in 1 week for further evaluation and consideration of cycle 4.   Poor venous access: Patient has had his port removed. Nontraumatic rhabdomyolysis: Resolved. Possibly related to recently diagnosed nocturnal seizures brought on by sleep apnea.  I spent a total of 30 minutes reviewing chart data, face-to-face evaluation with the patient, counseling and coordination of care as detailed above.   Patient expressed understanding and was in agreement with this plan. He also understands that He can call clinic at any time with any questions, concerns, or complaints.    Evalene JINNY Reusing, MD   09/17/2023 5:37  PM

## 2023-09-20 ENCOUNTER — Inpatient Hospital Stay: Admitting: Oncology

## 2023-09-20 ENCOUNTER — Telehealth: Payer: Self-pay | Admitting: *Deleted

## 2023-09-20 ENCOUNTER — Other Ambulatory Visit: Payer: Self-pay

## 2023-09-20 DIAGNOSIS — D693 Immune thrombocytopenic purpura: Secondary | ICD-10-CM

## 2023-09-20 DIAGNOSIS — Z5112 Encounter for antineoplastic immunotherapy: Secondary | ICD-10-CM | POA: Diagnosis not present

## 2023-09-20 LAB — CBC WITH DIFFERENTIAL/PLATELET
Abs Immature Granulocytes: 0.03 K/uL (ref 0.00–0.07)
Basophils Absolute: 0 K/uL (ref 0.0–0.1)
Basophils Relative: 1 %
Eosinophils Absolute: 0.1 K/uL (ref 0.0–0.5)
Eosinophils Relative: 2 %
HCT: 45 % (ref 39.0–52.0)
Hemoglobin: 14.5 g/dL (ref 13.0–17.0)
Immature Granulocytes: 1 %
Lymphocytes Relative: 22 %
Lymphs Abs: 1.5 K/uL (ref 0.7–4.0)
MCH: 25.3 pg — ABNORMAL LOW (ref 26.0–34.0)
MCHC: 32.2 g/dL (ref 30.0–36.0)
MCV: 78.7 fL — ABNORMAL LOW (ref 80.0–100.0)
Monocytes Absolute: 0.3 K/uL (ref 0.1–1.0)
Monocytes Relative: 5 %
Neutro Abs: 4.6 K/uL (ref 1.7–7.7)
Neutrophils Relative %: 69 %
Platelets: 50 K/uL — ABNORMAL LOW (ref 150–400)
RBC: 5.72 MIL/uL (ref 4.22–5.81)
RDW: 14.2 % (ref 11.5–15.5)
WBC: 6.6 K/uL (ref 4.0–10.5)
nRBC: 0 % (ref 0.0–0.2)

## 2023-09-20 LAB — ABO/RH: ABO/RH(D): AB POS

## 2023-09-20 NOTE — Telephone Encounter (Signed)
 Result note received from MD, patients PLT count 50 today. No need for transfusion tomorrow.  RN placed call to patient to update on results and him know that no transfusion is necessary at this time. Patient will keep follow up as planned. Patient verbalized understanding and is in agreement with plan. Will send scheduling message to cancel infusion appointment tomorrow.

## 2023-09-21 ENCOUNTER — Encounter: Payer: Self-pay | Admitting: Oncology

## 2023-09-21 ENCOUNTER — Inpatient Hospital Stay

## 2023-09-21 NOTE — Progress Notes (Signed)
 This encounter was created in error - please disregard.

## 2023-09-24 ENCOUNTER — Inpatient Hospital Stay: Admitting: Nurse Practitioner

## 2023-09-24 ENCOUNTER — Inpatient Hospital Stay

## 2023-09-24 VITALS — BP 147/77 | HR 87 | Temp 96.7°F | Resp 18 | Wt 375.0 lb

## 2023-09-24 DIAGNOSIS — D693 Immune thrombocytopenic purpura: Secondary | ICD-10-CM

## 2023-09-24 DIAGNOSIS — Z5112 Encounter for antineoplastic immunotherapy: Secondary | ICD-10-CM | POA: Diagnosis not present

## 2023-09-24 LAB — CBC WITH DIFFERENTIAL/PLATELET
Abs Immature Granulocytes: 0.01 K/uL (ref 0.00–0.07)
Basophils Absolute: 0 K/uL (ref 0.0–0.1)
Basophils Relative: 0 %
Eosinophils Absolute: 0.1 K/uL (ref 0.0–0.5)
Eosinophils Relative: 2 %
HCT: 41.2 % (ref 39.0–52.0)
Hemoglobin: 13.1 g/dL (ref 13.0–17.0)
Immature Granulocytes: 0 %
Lymphocytes Relative: 20 %
Lymphs Abs: 1.2 K/uL (ref 0.7–4.0)
MCH: 25.2 pg — ABNORMAL LOW (ref 26.0–34.0)
MCHC: 31.8 g/dL (ref 30.0–36.0)
MCV: 79.2 fL — ABNORMAL LOW (ref 80.0–100.0)
Monocytes Absolute: 0.4 K/uL (ref 0.1–1.0)
Monocytes Relative: 6 %
Neutro Abs: 4.5 K/uL (ref 1.7–7.7)
Neutrophils Relative %: 72 %
Platelets: 81 K/uL — ABNORMAL LOW (ref 150–400)
RBC: 5.2 MIL/uL (ref 4.22–5.81)
RDW: 13.8 % (ref 11.5–15.5)
WBC: 6.3 K/uL (ref 4.0–10.5)
nRBC: 0 % (ref 0.0–0.2)

## 2023-09-24 MED ORDER — FAMOTIDINE IN NACL 20-0.9 MG/50ML-% IV SOLN
20.0000 mg | Freq: Once | INTRAVENOUS | Status: AC
  Administered 2023-09-24: 20 mg via INTRAVENOUS
  Filled 2023-09-24: qty 50

## 2023-09-24 MED ORDER — DEXAMETHASONE SODIUM PHOSPHATE 10 MG/ML IJ SOLN
10.0000 mg | Freq: Once | INTRAMUSCULAR | Status: AC
  Administered 2023-09-24: 10 mg via INTRAVENOUS
  Filled 2023-09-24: qty 1

## 2023-09-24 MED ORDER — ACETAMINOPHEN 325 MG PO TABS
650.0000 mg | ORAL_TABLET | Freq: Once | ORAL | Status: AC
  Administered 2023-09-24: 650 mg via ORAL
  Filled 2023-09-24: qty 2

## 2023-09-24 MED ORDER — SODIUM CHLORIDE 0.9 % IV SOLN
375.0000 mg/m2 | Freq: Once | INTRAVENOUS | Status: AC
  Administered 2023-09-24: 1000 mg via INTRAVENOUS
  Filled 2023-09-24 (×2): qty 100

## 2023-09-24 MED ORDER — DIPHENHYDRAMINE HCL 25 MG PO CAPS
50.0000 mg | ORAL_CAPSULE | Freq: Once | ORAL | Status: AC
  Administered 2023-09-24: 50 mg via ORAL
  Filled 2023-09-24: qty 2

## 2023-09-24 MED ORDER — SODIUM CHLORIDE 0.9 % IV SOLN
INTRAVENOUS | Status: DC
  Filled 2023-09-24: qty 250

## 2023-09-24 NOTE — Progress Notes (Signed)
 Hematology/Oncology Consult Note [facility]  Telephone:(336) [phone] [phone]  ID: Samuel Rojas OB: [birth year]  MR#: [id number]  RDW#:[id number]  Patient Care Team: Center, [facility] as PCP - General (General Practice) Jacobo Evalene PARAS, MD as Consulting Physician (Oncology)  CHIEF COMPLAINT: Chronic ITP.  INTERVAL HISTORY: Patient returns to clinic today for further evaluation and consideration of cycle 4 of 4 weekly Rituxan .  He continues to feel well and is asymptomatic.  He does not complain of any weakness or fatigue.  Denies any easy bruising.  He continues to be active and working full-time.  Denies any neurologic complaints or recent seizures.  He denies any recent fevers or illness.  He denies chest pain, shortness of breath, cough, hemoptysis.  Denies any nausea, vomiting, constipation, diarrhea.  He has no urinary complaints.  He offers no other specific complaints today.  REVIEW OF SYSTEMS:   Review of Systems  Constitutional: Negative.  Negative for fever, malaise/fatigue and weight loss.  HENT: Negative.  Negative for nosebleeds.   Respiratory: Negative.  Negative for cough, hemoptysis and shortness of breath.   Cardiovascular: Negative.  Negative for chest pain and leg swelling.  Gastrointestinal: Negative.  Negative for abdominal pain, blood in stool and melena.  Genitourinary: Negative.  Negative for hematuria.  Musculoskeletal: Negative.  Negative for back pain and myalgias.  Skin: Negative.  Negative for rash.  Neurological: Negative.  Negative for sensory change, focal weakness and weakness.  Endo/Heme/Allergies:  Does not bruise/bleed easily.  Psychiatric/Behavioral: Negative.  The patient is not nervous/anxious.   As per HPI. Otherwise, a complete review of systems is negative.  PAST MEDICAL HISTORY: Past Medical History:  Diagnosis Date   Cellulitis    left leg   Epilepsy (HCC)    Idiopathic  thrombocytopenic purpura (ITP) (HCC)     PAST SURGICAL HISTORY: Past Surgical History:  Procedure Laterality Date   IR CV LINE INJECTION  04/12/2023   PORTA CATH INSERTION N/A 09/20/2017   Procedure: PORTA CATH INSERTION;  Surgeon: Marea Selinda RAMAN, MD;  Location: [facility] INVASIVE CV LAB;  Service: Cardiovascular;  Laterality: N/A;   PORTA CATH REMOVAL N/A 06/03/2023   Procedure: PORTA CATH REMOVAL;  Surgeon: Marea Selinda RAMAN, MD;  Location: [facility] INVASIVE CV LAB;  Service: Cardiovascular;  Laterality: N/A;   TONSILLECTOMY      FAMILY HISTORY: Family History  Problem Relation Age of Onset   Hypertension Mother    Diabetes Mother      ADVANCED DIRECTIVES:   HEALTH MAINTENANCE: Social History   Tobacco Use   Smoking status: Never   Smokeless tobacco: Never  Vaping Use   Vaping status: Never Used  Substance Use Topics   Alcohol use: No   Drug use: No    Colonoscopy:  PAP:  Bone density:  Lipid panel:  Allergies  Allergen Reactions   Augmentin [Amoxicillin-Pot Clavulanate] Hives   Sulfamethoxazole -Trimethoprim      Dehydration, confusion , high BP    Current Outpatient Medications  Medication Sig Dispense Refill   lamoTRIgine (LAMICTAL PO) Take 2 capsules by mouth in the morning and at bedtime.     predniSONE  (DELTASONE ) 50 MG tablet Take 2 tablets (100 mg total) by mouth daily with breakfast. 14 tablet 0   No current facility-administered medications for this visit.   Facility-Administered Medications Ordered in Other Visits  Medication Dose Route Frequency Provider Last Rate Last Admin   0.9 %  sodium chloride  infusion   Intravenous Continuous Finnegan,  Timothy J, MD       acetaminophen  (TYLENOL ) tablet 650 mg  650 mg Oral Once Finnegan, Timothy J, MD       dexamethasone  (DECADRON ) injection 10 mg  10 mg Intravenous Once Finnegan, Timothy J, MD       diphenhydrAMINE  (BENADRYL ) capsule 50 mg  50 mg Oral Once Finnegan, Timothy J, MD       famotidine  (PEPCID ) IVPB 20 mg premix   20 mg Intravenous Once Finnegan, Timothy J, MD       heparin  lock flush 100 UNIT/ML injection            heparin  lock flush 100 unit/mL  500 Units Intracatheter Once Finnegan, Timothy J, MD       heparin  lock flush 100 unit/mL  500 Units Intravenous Once Finnegan, Timothy J, MD       heparin  lock flush 100 unit/mL  500 Units Intravenous Once Finnegan, Timothy J, MD       heparin  lock flush 100 unit/mL  500 Units Intravenous Once Finnegan, Timothy J, MD       riTUXimab -abbs (TRUXIMA ) 1,000 mg in sodium chloride  0.9 % 250 mL (2.8571 mg/mL) infusion  375 mg/m2 (Order-Specific) Intravenous Once Finnegan, Timothy J, MD       sodium chloride  flush (NS) 0.9 % injection 10 mL  10 mL Intravenous PRN Finnegan, Timothy J, MD   10 mL at 02/27/20 1450   sodium chloride  flush (NS) 0.9 % injection 10 mL  10 mL Intravenous Once Finnegan, Timothy J, MD       sodium chloride  flush (NS) 0.9 % injection 10 mL  10 mL Intravenous Once Finnegan, Timothy J, MD       sodium chloride  flush (NS) 0.9 % injection 10 mL  10 mL Intravenous PRN Finnegan, Timothy J, MD   10 mL at 12/18/21 1403   sodium chloride  flush (NS) 0.9 % injection 10 mL  10 mL Intravenous PRN Finnegan, Timothy J, MD        OBJECTIVE: There were no vitals filed for this visit.  There is no height or weight on file to calculate BMI.     ECOG FS:0 - Asymptomatic  General: Well-developed, well-nourished, no acute distress. Eyes: Pink conjunctiva, anicteric sclera. HEENT: Normocephalic, moist mucous membranes. Lungs: No audible wheezing or coughing. Heart: Regular rate and rhythm. Abdomen: Soft, nontender, no obvious distention. Musculoskeletal: No edema, cyanosis, or clubbing. Neuro: Alert, answering all questions appropriately. Cranial nerves grossly intact. Skin: No rashes or petechiae noted. Psych: Normal affect.  LAB RESULTS:  Lab Results  Component Value Date   NA 136 01/07/2023   K 3.7 01/07/2023   CL 101 01/07/2023   CO2 26 01/07/2023    GLUCOSE 130 (H) 01/07/2023   BUN 13 01/07/2023   CREATININE 0.91 01/07/2023   CALCIUM 8.6 (L) 01/07/2023   PROT 7.3 07/30/2020   ALBUMIN 4.0 07/30/2020   AST 23 07/30/2020   ALT 26 07/30/2020   ALKPHOS 77 07/30/2020   BILITOT 0.8 07/30/2020   GFRNONAA >60 01/07/2023   GFRAA >60 08/26/2019    Lab Results  Component Value Date   WBC 6.3 09/24/2023   NEUTROABS 4.5 09/24/2023   HGB 13.1 09/24/2023   HCT 41.2 09/24/2023   MCV 79.2 (L) 09/24/2023   PLT 81 (L) 09/24/2023   Lab Results  Component Value Date   IRON 50 09/09/2015   TIBC 320 09/09/2015   IRONPCTSAT 16 (L) 09/09/2015   Lab Results  Component Value Date  FERRITIN 75 09/09/2015   STUDIES: No results found.  TREATMENT HISTORY: Weekly Rituxan  x4 completed on October 08, 2017 Weekly Rituxan  x4 completed on September 09, 2018 Weekly Rituxan  x4 completed on  March 22, 2020 Weekly Rituxan  x4 completed on May 23, 2021  Weekly rituxan  x 4 - D1C1 09/03/23 Weekly rituxan  x 4 - D1C2 09/10/23 Weekly rituxan  x 4- D1C3 09/17/23  ASSESSMENT: Chronic ITP.  PLAN:     Chronic ITP: Previously, patient received 5 days of 100 mg prednisone  with an excellent response to his platelets increasing to greater than 200.  Unfortunately, response was not durable. 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  Splenic ultrasound did not reveal any evidence of splenomegaly.  Bone marrow biopsy was not completed.  Patient has had multiple rounds of weekly Rituxan  x4. Complete treatment history as above. He has historically had a delayed incerase in his platelet count after starting treatment and this has been the case with this round of rituximab  as well. Previously his platelets dropped to 6 but have since increased and now s/p cycle 3 of planned 4, his platelets are up to 81. He had also received 7 day course of 100 mg prednisone . Labs reviewed and acceptable for continuation of treatment. Proceed with  rituximab  today. No platelet transfusion.  Poor venous access: Patient has had his port removed. Nontraumatic rhabdomyolysis: Resolved. Possibly related to recently diagnosed nocturnal seizures brought on by sleep apnea.  Disposition:  Treatment today 4 weeks- lab (cbc, cmp), Dr Jacobo- la  I spent a total of 20 minutes reviewing chart data, face-to-face evaluation with the patient, counseling and coordination of care as detailed above.  Patient expressed understanding and was in agreement with this plan. He also understands that He can call clinic at any time with any questions, concerns, or complaints.   Tinnie KANDICE Dawn, NP    09/24/2023

## 2023-10-22 ENCOUNTER — Inpatient Hospital Stay

## 2023-10-22 ENCOUNTER — Inpatient Hospital Stay: Admitting: Oncology

## 2023-11-01 ENCOUNTER — Encounter: Payer: Self-pay | Admitting: Oncology

## 2023-11-01 ENCOUNTER — Inpatient Hospital Stay: Attending: Oncology

## 2023-11-01 ENCOUNTER — Inpatient Hospital Stay (HOSPITAL_BASED_OUTPATIENT_CLINIC_OR_DEPARTMENT_OTHER): Admitting: Oncology

## 2023-11-01 VITALS — BP 138/79 | HR 75 | Temp 97.6°F | Resp 18 | Wt 377.7 lb

## 2023-11-01 DIAGNOSIS — D693 Immune thrombocytopenic purpura: Secondary | ICD-10-CM

## 2023-11-01 LAB — CMP (CANCER CENTER ONLY)
ALT: 28 U/L (ref 0–44)
AST: 22 U/L (ref 15–41)
Albumin: 3.8 g/dL (ref 3.5–5.0)
Alkaline Phosphatase: 97 U/L (ref 38–126)
Anion gap: 7 (ref 5–15)
BUN: 11 mg/dL (ref 6–20)
CO2: 25 mmol/L (ref 22–32)
Calcium: 8.2 mg/dL — ABNORMAL LOW (ref 8.9–10.3)
Chloride: 104 mmol/L (ref 98–111)
Creatinine: 0.73 mg/dL (ref 0.61–1.24)
GFR, Estimated: >60 mL/min (ref >60)
Glucose, Bld: 96 mg/dL (ref 70–99)
Potassium: 3.3 mmol/L — ABNORMAL LOW (ref 3.5–5.1)
Sodium: 136 mmol/L (ref 135–145)
Total Bilirubin: 0.5 mg/dL (ref 0.0–1.2)
Total Protein: 6.6 g/dL (ref 6.5–8.1)

## 2023-11-01 LAB — CBC WITH DIFFERENTIAL (CANCER CENTER ONLY)
Abs Immature Granulocytes: 0.02 K/uL (ref 0.00–0.07)
Basophils Absolute: 0 K/uL (ref 0.0–0.1)
Basophils Relative: 0 %
Eosinophils Absolute: 0.2 K/uL (ref 0.0–0.5)
Eosinophils Relative: 3 %
HCT: 41.6 % (ref 39.0–52.0)
Hemoglobin: 13.4 g/dL (ref 13.0–17.0)
Immature Granulocytes: 0 %
Lymphocytes Relative: 19 %
Lymphs Abs: 1.1 K/uL (ref 0.7–4.0)
MCH: 25.3 pg — ABNORMAL LOW (ref 26.0–34.0)
MCHC: 32.2 g/dL (ref 30.0–36.0)
MCV: 78.5 fL — ABNORMAL LOW (ref 80.0–100.0)
Monocytes Absolute: 0.4 K/uL (ref 0.1–1.0)
Monocytes Relative: 7 %
Neutro Abs: 4.2 K/uL (ref 1.7–7.7)
Neutrophils Relative %: 71 %
Platelet Count: 125 K/uL — ABNORMAL LOW (ref 150–400)
RBC: 5.3 MIL/uL (ref 4.22–5.81)
RDW: 13.7 % (ref 11.5–15.5)
WBC Count: 6 K/uL (ref 4.0–10.5)
nRBC: 0 % (ref 0.0–0.2)

## 2023-11-01 NOTE — Progress Notes (Signed)
 [facility]  Telephone:(336) [phone] Fax:(336) [phone]  ID: Samuel Rojas OB: Aug 09, 1990  MR#: [id number]  RDW#:[id number]  Patient Care Team: Center, [facility] Blamer [facility] as PCP - General (General Practice) Jacobo Evalene PARAS, MD as Consulting Physician (Oncology)  CHIEF COMPLAINT: Chronic ITP.  INTERVAL HISTORY: Patient returns to clinic today for repeat laboratory work and further evaluation.  He received his fourth cycle of weekly Rituxan  on September 24, 2023.  He continues to feel well and remains asymptomatic.  He does not complain of any weakness or fatigue today.  He denies any easy bruising.  He continues to be active and work full-time. He has no neurologic complaints or recent seizures. He denies any recent fevers or illnesses. He has no chest pain, shortness of breath, cough, or hemoptysis.  He denies any nausea, vomiting, constipation, or diarrhea. He has no urinary complaints.  Patient offers no specific complaints today.  REVIEW OF SYSTEMS:   Review of Systems  Constitutional: Negative.  Negative for fever, malaise/fatigue and weight loss.  HENT: Negative.  Negative for nosebleeds.   Respiratory: Negative.  Negative for cough, hemoptysis and shortness of breath.   Cardiovascular: Negative.  Negative for chest pain and leg swelling.  Gastrointestinal: Negative.  Negative for abdominal pain, blood in stool and melena.  Genitourinary: Negative.  Negative for hematuria.  Musculoskeletal: Negative.  Negative for back pain and myalgias.  Skin: Negative.  Negative for rash.  Neurological: Negative.  Negative for sensory change, focal weakness and weakness.  Endo/Heme/Allergies:  Does not bruise/bleed easily.  Psychiatric/Behavioral: Negative.  The patient is not nervous/anxious.     As per HPI. Otherwise, a complete review of systems is negative.  PAST MEDICAL HISTORY: Past Medical History:  Diagnosis Date   Cellulitis    left leg    Epilepsy (HCC)    Idiopathic thrombocytopenic purpura (ITP) (HCC)     PAST SURGICAL HISTORY: Past Surgical History:  Procedure Laterality Date   IR CV LINE INJECTION  04/12/2023   PORTA CATH INSERTION N/A 09/20/2017   Procedure: PORTA CATH INSERTION;  Surgeon: Marea Selinda RAMAN, MD;  Location: [facility] INVASIVE CV LAB;  Service: Cardiovascular;  Laterality: N/A;   PORTA CATH REMOVAL N/A 06/03/2023   Procedure: PORTA CATH REMOVAL;  Surgeon: Marea Selinda RAMAN, MD;  Location: [facility] INVASIVE CV LAB;  Service: Cardiovascular;  Laterality: N/A;   TONSILLECTOMY      FAMILY HISTORY: Family History  Problem Relation Age of Onset   Hypertension Mother    Diabetes Mother        ADVANCED DIRECTIVES:    HEALTH MAINTENANCE: Social History   Tobacco Use   Smoking status: Never   Smokeless tobacco: Never  Vaping Use   Vaping status: Never Used  Substance Use Topics   Alcohol use: No   Drug use: No     Colonoscopy:  PAP:  Bone density:  Lipid panel:  Allergies  Allergen Reactions   Augmentin [Amoxicillin-Pot Clavulanate] Hives   Sulfamethoxazole -Trimethoprim      Dehydration, confusion , high BP    Current Outpatient Medications  Medication Sig Dispense Refill   lamoTRIgine (LAMICTAL PO) Take 2 capsules by mouth in the morning and at bedtime.     metFORMIN  (GLUCOPHAGE ) 500 MG tablet Take 500 mg by mouth.     predniSONE  (DELTASONE ) 50 MG tablet Take 2 tablets (100 mg total) by mouth daily with breakfast. (Patient not taking: Reported on 11/01/2023) 14 tablet 0   No current facility-administered medications for this  visit.   Facility-Administered Medications Ordered in Other Visits  Medication Dose Route Frequency Provider Last Rate Last Admin   heparin  lock flush 100 UNIT/ML injection            heparin  lock flush 100 unit/mL  500 Units Intracatheter Once Jacobo, Foch Rosenwald J, MD       heparin  lock flush 100 unit/mL  500 Units Intravenous Once Ebonie Westerlund J, MD       heparin  lock flush  100 unit/mL  500 Units Intravenous Once Bernie Fobes J, MD       heparin  lock flush 100 unit/mL  500 Units Intravenous Once Idalis Hoelting J, MD       sodium chloride  flush (NS) 0.9 % injection 10 mL  10 mL Intravenous PRN Rokhaya Quinn J, MD   10 mL at 02/27/20 1450   sodium chloride  flush (NS) 0.9 % injection 10 mL  10 mL Intravenous Once Mio Schellinger J, MD       sodium chloride  flush (NS) 0.9 % injection 10 mL  10 mL Intravenous Once Tiasha Helvie J, MD       sodium chloride  flush (NS) 0.9 % injection 10 mL  10 mL Intravenous PRN Brooksie Ellwanger J, MD   10 mL at 12/18/21 1403   sodium chloride  flush (NS) 0.9 % injection 10 mL  10 mL Intravenous PRN Jacobo Evalene PARAS, MD        OBJECTIVE: Vitals:   11/01/23 1341  BP: 138/79  Pulse: 75  Resp: 18  Temp: 97.6 F (36.4 C)  SpO2: 98%        Body mass index is 60.96 kg/m.    ECOG FS:0 - Asymptomatic  General: Well-developed, well-nourished, no acute distress. Eyes: Pink conjunctiva, anicteric sclera. HEENT: Normocephalic, moist mucous membranes. Lungs: No audible wheezing or coughing. Heart: Regular rate and rhythm. Abdomen: Soft, nontender, no obvious distention. Musculoskeletal: No edema, cyanosis, or clubbing. Neuro: Alert, answering all questions appropriately. Cranial nerves grossly intact. Skin: No rashes or petechiae noted. Psych: Normal affect.  LAB RESULTS:  Lab Results  Component Value Date   NA 136 11/01/2023   K 3.3 (L) 11/01/2023   CL 104 11/01/2023   CO2 25 11/01/2023   GLUCOSE 96 11/01/2023   BUN 11 11/01/2023   CREATININE 0.73 11/01/2023   CALCIUM 8.2 (L) 11/01/2023   PROT 6.6 11/01/2023   ALBUMIN 3.8 11/01/2023   AST 22 11/01/2023   ALT 28 11/01/2023   ALKPHOS 97 11/01/2023   BILITOT 0.5 11/01/2023   GFRNONAA >60 11/01/2023   GFRAA >60 08/26/2019    Lab Results  Component Value Date   WBC 6.0 11/01/2023   NEUTROABS 4.2 11/01/2023   HGB 13.4 11/01/2023   HCT 41.6  11/01/2023   MCV 78.5 (L) 11/01/2023   PLT 125 (L) 11/01/2023   Lab Results  Component Value Date   IRON 50 09/09/2015   TIBC 320 09/09/2015   IRONPCTSAT 16 (L) 09/09/2015   Lab Results  Component Value Date   FERRITIN 75 09/09/2015     STUDIES: No results found.  TREATMENT HISTORY: Weekly Rituxan  x4 completed on October 08, 2017 Weekly Rituxan  x4 completed on September 09, 2018 Weekly Rituxan  x4 completed on  March 22, 2020 Weekly Rituxan  x4 completed on May 23, 2021 Weekly Rituxan  x 4 completed on September 24, 2023.  ASSESSMENT: Chronic ITP.  PLAN:     Chronic ITP: Previously, patient received 5 days of 100 mg prednisone  with an excellent response to his  platelets increasing to greater than 200.  Unfortunately, response was not durable. 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  Splenic ultrasound did not reveal any evidence of splenomegaly.  Bone marrow biopsy was not completed.  Patient has had multiple rounds of weekly Rituxan  x4. See above for patient treatment history.  Typically he has a delayed increase in his platelet count after receiving treatment.  Patient completed his most recent round of Rituxan  approximately 1 month ago and his platelets have improved to 125.  No intervention is needed at this time.  Return to clinic in 3 months for laboratory work and further evaluation.     Poor venous access: Patient has had his port removed. Nontraumatic rhabdomyolysis: Resolved. Possibly related to recently diagnosed nocturnal seizures brought on by sleep apnea. Bariatric surgery: Patient reports he is being evaluated for weight loss surgery.  Okay to proceed from a hematology standpoint as long as platelets are adequate.  I spent a total of 20 minutes reviewing chart data, face-to-face evaluation with the patient, counseling and coordination of care as detailed above.    Patient expressed understanding and was in agreement with  this plan. He also understands that He can call clinic at any time with any questions, concerns, or complaints.    Evalene JINNY Reusing, MD   11/01/2023 2:06 PM

## 2023-12-20 ENCOUNTER — Encounter: Payer: Self-pay | Admitting: Oncology

## 2024-02-01 ENCOUNTER — Other Ambulatory Visit: Payer: Self-pay | Admitting: *Deleted

## 2024-02-01 DIAGNOSIS — D693 Immune thrombocytopenic purpura: Secondary | ICD-10-CM

## 2024-02-07 ENCOUNTER — Other Ambulatory Visit

## 2024-02-07 ENCOUNTER — Inpatient Hospital Stay: Admitting: Oncology

## 2024-02-16 ENCOUNTER — Inpatient Hospital Stay: Admitting: Oncology

## 2024-02-16 ENCOUNTER — Inpatient Hospital Stay: Attending: Oncology

## 2024-02-16 ENCOUNTER — Encounter: Payer: Self-pay | Admitting: Oncology

## 2024-02-16 VITALS — BP 148/98 | HR 75 | Temp 98.2°F | Resp 18 | Wt 379.0 lb

## 2024-02-16 DIAGNOSIS — D693 Immune thrombocytopenic purpura: Secondary | ICD-10-CM | POA: Diagnosis not present

## 2024-02-16 LAB — CBC WITH DIFFERENTIAL/PLATELET
Abs Immature Granulocytes: 0.01 K/uL (ref 0.00–0.07)
Basophils Absolute: 0 K/uL (ref 0.0–0.1)
Basophils Relative: 0 %
Eosinophils Absolute: 0.1 K/uL (ref 0.0–0.5)
Eosinophils Relative: 2 %
HCT: 42.6 % (ref 39.0–52.0)
Hemoglobin: 13.7 g/dL (ref 13.0–17.0)
Immature Granulocytes: 0 %
Lymphocytes Relative: 19 %
Lymphs Abs: 1 K/uL (ref 0.7–4.0)
MCH: 25.1 pg — ABNORMAL LOW (ref 26.0–34.0)
MCHC: 32.2 g/dL (ref 30.0–36.0)
MCV: 78.2 fL — ABNORMAL LOW (ref 80.0–100.0)
Monocytes Absolute: 0.3 K/uL (ref 0.1–1.0)
Monocytes Relative: 5 %
Neutro Abs: 4 K/uL (ref 1.7–7.7)
Neutrophils Relative %: 74 %
Platelets: 168 K/uL (ref 150–400)
RBC: 5.45 MIL/uL (ref 4.22–5.81)
RDW: 14.3 % (ref 11.5–15.5)
WBC: 5.4 K/uL (ref 4.0–10.5)
nRBC: 0 % (ref 0.0–0.2)

## 2024-02-16 LAB — CMP (CANCER CENTER ONLY)
ALT: 25 U/L (ref 0–44)
AST: 17 U/L (ref 15–41)
Albumin: 4.1 g/dL (ref 3.5–5.0)
Alkaline Phosphatase: 103 U/L (ref 38–126)
Anion gap: 11 (ref 5–15)
BUN: 15 mg/dL (ref 6–20)
CO2: 24 mmol/L (ref 22–32)
Calcium: 8.8 mg/dL — ABNORMAL LOW (ref 8.9–10.3)
Chloride: 106 mmol/L (ref 98–111)
Creatinine: 0.84 mg/dL (ref 0.61–1.24)
GFR, Estimated: >60 mL/min
Glucose, Bld: 99 mg/dL (ref 70–99)
Potassium: 3.8 mmol/L (ref 3.5–5.1)
Sodium: 142 mmol/L (ref 135–145)
Total Bilirubin: 0.5 mg/dL (ref 0.0–1.2)
Total Protein: 6.6 g/dL (ref 6.5–8.1)

## 2024-02-16 NOTE — Progress Notes (Signed)
 " [facility]  [phone] [phone]  ID: Samuel Rojas OB: 11/08/1990  MR#: [id number]  RDW#:[id number]  Patient Care Team: [facility] as PCP - General (General Practice) Jacobo Evalene PARAS, MD as Consulting Physician (Oncology)  CHIEF COMPLAINT: Chronic ITP.  INTERVAL HISTORY: Patient returns to clinic today for repeat laboratory work and further evaluation.  She currently feels well and is asymptomatic.  He does not complain of any weakness and fatigue.  He denies any easy bruising.  He continues to be active and work full-time. He has no neurologic complaints or recent seizures. He denies any recent fevers or illnesses. He has no chest pain, shortness of breath, cough, or hemoptysis.  He denies any nausea, vomiting, constipation, or diarrhea. He has no urinary complaints.  Patient offers no specific complaints today.  REVIEW OF SYSTEMS:   Review of Systems  Constitutional: Negative.  Negative for fever, malaise/fatigue and weight loss.  HENT: Negative.  Negative for nosebleeds.   Respiratory: Negative.  Negative for cough, hemoptysis and shortness of breath.   Cardiovascular: Negative.  Negative for chest pain and leg swelling.  Gastrointestinal: Negative.  Negative for abdominal pain, blood in stool and melena.  Genitourinary: Negative.  Negative for hematuria.  Musculoskeletal: Negative.  Negative for back pain and myalgias.  Skin: Negative.  Negative for rash.  Neurological: Negative.  Negative for sensory change, focal weakness and weakness.  Endo/Heme/Allergies:  Does not bruise/bleed easily.  Psychiatric/Behavioral: Negative.  The patient is not nervous/anxious.     As per HPI. Otherwise, a complete review of systems is negative.  PAST MEDICAL HISTORY: Past Medical History:  Diagnosis Date   Cellulitis    left leg   Epilepsy (HCC)    Idiopathic thrombocytopenic purpura (ITP) (HCC)     PAST  SURGICAL HISTORY: Past Surgical History:  Procedure Laterality Date   IR CV LINE INJECTION  04/12/2023   PORTA CATH INSERTION N/A 09/20/2017   Procedure: PORTA CATH INSERTION;  Surgeon: Marea Selinda RAMAN, MD;  Location: [facility] INVASIVE CV LAB;  Service: Cardiovascular;  Laterality: N/A;   PORTA CATH REMOVAL N/A 06/03/2023   Procedure: PORTA CATH REMOVAL;  Surgeon: Marea Selinda RAMAN, MD;  Location: [facility] INVASIVE CV LAB;  Service: Cardiovascular;  Laterality: N/A;   TONSILLECTOMY      FAMILY HISTORY: Family History  Problem Relation Age of Onset   Hypertension Mother    Diabetes Mother        ADVANCED DIRECTIVES:    HEALTH MAINTENANCE: Social History   Tobacco Use   Smoking status: Never   Smokeless tobacco: Never  Vaping Use   Vaping status: Never Used  Substance Use Topics   Alcohol use: No   Drug use: No     Colonoscopy:  PAP:  Bone density:  Lipid panel:  Allergies  Allergen Reactions   Augmentin [Amoxicillin-Pot Clavulanate] Hives   Sulfamethoxazole -Trimethoprim      Dehydration, confusion , high BP    Current Outpatient Medications  Medication Sig Dispense Refill   lamoTRIgine (LAMICTAL PO) Take 2 capsules by mouth in the morning and at bedtime.     metFORMIN  (GLUCOPHAGE ) 500 MG tablet SMARTSIG:1 Tablet(s) By Mouth Morning-Evening     Vitamin D, Ergocalciferol, (DRISDOL) 1.25 MG (50000 UNIT) CAPS capsule Take 50,000 Units by mouth once a week.     predniSONE  (DELTASONE ) 50 MG tablet Take 2 tablets (100 mg total) by mouth daily with breakfast. (Patient not taking: Reported on 02/16/2024) 14 tablet 0  No current facility-administered medications for this visit.   Facility-Administered Medications Ordered in Other Visits  Medication Dose Route Frequency Provider Last Rate Last Admin   heparin  lock flush 100 UNIT/ML injection            heparin  lock flush 100 unit/mL  500 Units Intracatheter Once Jacobo, Kassadi Presswood J, MD       heparin  lock flush 100 unit/mL  500 Units  Intravenous Once Amish Mintzer J, MD       heparin  lock flush 100 unit/mL  500 Units Intravenous Once Jahziel Sinn J, MD       heparin  lock flush 100 unit/mL  500 Units Intravenous Once Verbie Babic J, MD       sodium chloride  flush (NS) 0.9 % injection 10 mL  10 mL Intravenous PRN Codylee Patil J, MD   10 mL at 02/27/20 1450   sodium chloride  flush (NS) 0.9 % injection 10 mL  10 mL Intravenous Once Julya Alioto J, MD       sodium chloride  flush (NS) 0.9 % injection 10 mL  10 mL Intravenous Once Elija Mccamish J, MD       sodium chloride  flush (NS) 0.9 % injection 10 mL  10 mL Intravenous PRN Aneri Slagel J, MD   10 mL at 12/18/21 1403   sodium chloride  flush (NS) 0.9 % injection 10 mL  10 mL Intravenous PRN Jacobo Evalene PARAS, MD        OBJECTIVE: Vitals:   02/16/24 1018 02/16/24 1023  BP: (!) 146/101 (!) 148/98  Pulse: 75   Resp: 18   Temp: 98.2 F (36.8 C)   SpO2: 98%        Body mass index is 61.17 kg/m.    ECOG FS:0 - Asymptomatic  General: Well-developed, well-nourished, no acute distress. Eyes: Pink conjunctiva, anicteric sclera. HEENT: Normocephalic, moist mucous membranes. Lungs: No audible wheezing or coughing. Heart: Regular rate and rhythm. Abdomen: Soft, nontender, no obvious distention. Musculoskeletal: No edema, cyanosis, or clubbing. Neuro: Alert, answering all questions appropriately. Cranial nerves grossly intact. Skin: No rashes or petechiae noted. Psych: Normal affect.  LAB RESULTS:  Lab Results  Component Value Date   NA 142 02/16/2024   K 3.8 02/16/2024   CL 106 02/16/2024   CO2 24 02/16/2024   GLUCOSE 99 02/16/2024   BUN 15 02/16/2024   CREATININE 0.84 02/16/2024   CALCIUM 8.8 (L) 02/16/2024   PROT 6.6 02/16/2024   ALBUMIN 4.1 02/16/2024   AST 17 02/16/2024   ALT 25 02/16/2024   ALKPHOS 103 02/16/2024   BILITOT 0.5 02/16/2024   GFRNONAA >60 02/16/2024   GFRAA >60 08/26/2019    Lab Results  Component Value  Date   WBC 5.4 02/16/2024   NEUTROABS 4.0 02/16/2024   HGB 13.7 02/16/2024   HCT 42.6 02/16/2024   MCV 78.2 (L) 02/16/2024   PLT 168 02/16/2024   Lab Results  Component Value Date   IRON 50 09/09/2015   TIBC 320 09/09/2015   IRONPCTSAT 16 (L) 09/09/2015   Lab Results  Component Value Date   FERRITIN 75 09/09/2015     STUDIES: No results found.  TREATMENT HISTORY: Weekly Rituxan  x4 completed on October 08, 2017 Weekly Rituxan  x4 completed on September 09, 2018 Weekly Rituxan  x4 completed on  March 22, 2020 Weekly Rituxan  x4 completed on May 23, 2021 Weekly Rituxan  x 4 completed on September 24, 2023.  ASSESSMENT: Chronic ITP.  PLAN:     Chronic ITP: Previously, patient received  5 days of 100 mg prednisone  with an excellent response to his platelets increasing to greater than 200.  Unfortunately, response was not durable. 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  Splenic ultrasound did not reveal any evidence of splenomegaly.  Bone marrow biopsy was not completed.  Patient has had multiple rounds of weekly Rituxan  x4. See above for patient treatment history.  Typically he has a delayed increase in his platelet count after receiving treatment.  Patient's platelet count is within normal limits today.  No intervention is needed.  Return to clinic in 3 months for laboratory work only and then in 6 months for laboratory work and further evaluation.   Poor venous access: Patient has had his port removed. Nontraumatic rhabdomyolysis: Resolved. Possibly related to history of  nocturnal seizures brought on by sleep apnea. Bariatric surgery: Okay to proceed from a hematology standpoint as long as platelets are adequate.  I spent a total of 20 minutes reviewing chart data, face-to-face evaluation with the patient, counseling and coordination of care as detailed above.   Patient expressed understanding and was in agreement with this plan. He  also understands that He can call clinic at any time with any questions, concerns, or complaints.    Evalene JINNY Reusing, MD   02/16/2024 10:25 AM     "

## 2024-05-16 ENCOUNTER — Inpatient Hospital Stay

## 2024-08-17 ENCOUNTER — Inpatient Hospital Stay

## 2024-08-17 ENCOUNTER — Inpatient Hospital Stay: Admitting: Oncology
